# Patient Record
Sex: Male | Born: 1935
Health system: Southern US, Community
[De-identification: ages and names within clinical notes are randomized; demographics above are authoritative.]

## PROBLEM LIST (undated history)

## (undated) DIAGNOSIS — I10 Essential (primary) hypertension: Secondary | ICD-10-CM

## (undated) DIAGNOSIS — E785 Hyperlipidemia, unspecified: Secondary | ICD-10-CM

## (undated) DIAGNOSIS — F0390 Unspecified dementia without behavioral disturbance: Secondary | ICD-10-CM

## (undated) DIAGNOSIS — C61 Malignant neoplasm of prostate: Secondary | ICD-10-CM

## (undated) DIAGNOSIS — G250 Essential tremor: Secondary | ICD-10-CM

## (undated) DIAGNOSIS — I480 Paroxysmal atrial fibrillation: Secondary | ICD-10-CM

## (undated) DIAGNOSIS — D61818 Other pancytopenia: Secondary | ICD-10-CM

## (undated) DIAGNOSIS — J45909 Unspecified asthma, uncomplicated: Secondary | ICD-10-CM

## (undated) DIAGNOSIS — R569 Unspecified convulsions: Secondary | ICD-10-CM

## (undated) DIAGNOSIS — I2510 Atherosclerotic heart disease of native coronary artery without angina pectoris: Secondary | ICD-10-CM

## (undated) DIAGNOSIS — I509 Heart failure, unspecified: Secondary | ICD-10-CM

## (undated) HISTORY — PX: CATARACT EXTRACTION: SUR2

## (undated) HISTORY — DX: Unspecified convulsions: R56.9

## (undated) HISTORY — PX: HERNIA REPAIR: SHX51

## (undated) HISTORY — PX: CARDIOVERSION: SHX1299

## (undated) HISTORY — PX: AORTIC VALVE REPLACEMENT: SHX41

## (undated) HISTORY — DX: Other pancytopenia: D61.818

---

## 2012-12-13 DIAGNOSIS — K4090 Unilateral inguinal hernia, without obstruction or gangrene, not specified as recurrent: Secondary | ICD-10-CM | POA: Insufficient documentation

## 2013-02-14 DIAGNOSIS — I340 Nonrheumatic mitral (valve) insufficiency: Secondary | ICD-10-CM | POA: Insufficient documentation

## 2013-02-15 DIAGNOSIS — Z720 Tobacco use: Secondary | ICD-10-CM | POA: Insufficient documentation

## 2014-09-04 DIAGNOSIS — J920 Pleural plaque with presence of asbestos: Secondary | ICD-10-CM | POA: Insufficient documentation

## 2015-11-24 DIAGNOSIS — R001 Bradycardia, unspecified: Secondary | ICD-10-CM | POA: Insufficient documentation

## 2016-03-16 DIAGNOSIS — I340 Nonrheumatic mitral (valve) insufficiency: Secondary | ICD-10-CM | POA: Diagnosis not present

## 2016-03-16 DIAGNOSIS — D699 Hemorrhagic condition, unspecified: Secondary | ICD-10-CM | POA: Diagnosis not present

## 2016-03-16 DIAGNOSIS — I10 Essential (primary) hypertension: Secondary | ICD-10-CM | POA: Diagnosis not present

## 2016-03-16 DIAGNOSIS — G4733 Obstructive sleep apnea (adult) (pediatric): Secondary | ICD-10-CM | POA: Diagnosis not present

## 2016-03-16 DIAGNOSIS — I480 Paroxysmal atrial fibrillation: Secondary | ICD-10-CM | POA: Diagnosis not present

## 2016-04-21 DIAGNOSIS — D699 Hemorrhagic condition, unspecified: Secondary | ICD-10-CM | POA: Diagnosis not present

## 2016-04-27 DIAGNOSIS — D699 Hemorrhagic condition, unspecified: Secondary | ICD-10-CM | POA: Diagnosis not present

## 2016-05-03 DIAGNOSIS — D699 Hemorrhagic condition, unspecified: Secondary | ICD-10-CM | POA: Diagnosis not present

## 2016-05-10 DIAGNOSIS — C61 Malignant neoplasm of prostate: Secondary | ICD-10-CM | POA: Diagnosis not present

## 2016-05-14 DIAGNOSIS — C61 Malignant neoplasm of prostate: Secondary | ICD-10-CM | POA: Diagnosis not present

## 2016-05-14 DIAGNOSIS — N5201 Erectile dysfunction due to arterial insufficiency: Secondary | ICD-10-CM | POA: Diagnosis not present

## 2016-05-14 DIAGNOSIS — D699 Hemorrhagic condition, unspecified: Secondary | ICD-10-CM | POA: Diagnosis not present

## 2016-05-14 DIAGNOSIS — R3915 Urgency of urination: Secondary | ICD-10-CM | POA: Diagnosis not present

## 2016-05-14 DIAGNOSIS — R351 Nocturia: Secondary | ICD-10-CM | POA: Diagnosis not present

## 2016-05-14 DIAGNOSIS — R350 Frequency of micturition: Secondary | ICD-10-CM | POA: Diagnosis not present

## 2016-06-14 DIAGNOSIS — D699 Hemorrhagic condition, unspecified: Secondary | ICD-10-CM | POA: Diagnosis not present

## 2016-06-15 DIAGNOSIS — I480 Paroxysmal atrial fibrillation: Secondary | ICD-10-CM | POA: Diagnosis not present

## 2016-06-15 DIAGNOSIS — M479 Spondylosis, unspecified: Secondary | ICD-10-CM | POA: Diagnosis not present

## 2016-06-15 DIAGNOSIS — J189 Pneumonia, unspecified organism: Secondary | ICD-10-CM | POA: Diagnosis not present

## 2016-06-15 DIAGNOSIS — J920 Pleural plaque with presence of asbestos: Secondary | ICD-10-CM | POA: Diagnosis not present

## 2016-06-15 DIAGNOSIS — Z952 Presence of prosthetic heart valve: Secondary | ICD-10-CM | POA: Diagnosis not present

## 2016-07-13 DIAGNOSIS — I4891 Unspecified atrial fibrillation: Secondary | ICD-10-CM | POA: Diagnosis not present

## 2016-07-29 DIAGNOSIS — I480 Paroxysmal atrial fibrillation: Secondary | ICD-10-CM | POA: Diagnosis not present

## 2016-07-29 DIAGNOSIS — R079 Chest pain, unspecified: Secondary | ICD-10-CM | POA: Diagnosis not present

## 2016-07-29 DIAGNOSIS — J920 Pleural plaque with presence of asbestos: Secondary | ICD-10-CM | POA: Diagnosis not present

## 2016-07-29 DIAGNOSIS — R0609 Other forms of dyspnea: Secondary | ICD-10-CM | POA: Diagnosis not present

## 2016-07-29 DIAGNOSIS — I062 Rheumatic aortic stenosis with insufficiency: Secondary | ICD-10-CM | POA: Diagnosis not present

## 2016-08-11 DIAGNOSIS — I519 Heart disease, unspecified: Secondary | ICD-10-CM | POA: Diagnosis not present

## 2016-08-11 DIAGNOSIS — I361 Nonrheumatic tricuspid (valve) insufficiency: Secondary | ICD-10-CM | POA: Diagnosis not present

## 2016-08-11 DIAGNOSIS — I4891 Unspecified atrial fibrillation: Secondary | ICD-10-CM | POA: Diagnosis not present

## 2016-08-11 DIAGNOSIS — I062 Rheumatic aortic stenosis with insufficiency: Secondary | ICD-10-CM | POA: Diagnosis not present

## 2016-08-11 DIAGNOSIS — I340 Nonrheumatic mitral (valve) insufficiency: Secondary | ICD-10-CM | POA: Diagnosis not present

## 2016-08-11 DIAGNOSIS — Z954 Presence of other heart-valve replacement: Secondary | ICD-10-CM | POA: Diagnosis not present

## 2016-08-11 DIAGNOSIS — I517 Cardiomegaly: Secondary | ICD-10-CM | POA: Diagnosis not present

## 2016-08-19 DIAGNOSIS — D699 Hemorrhagic condition, unspecified: Secondary | ICD-10-CM | POA: Diagnosis not present

## 2016-08-26 DIAGNOSIS — D699 Hemorrhagic condition, unspecified: Secondary | ICD-10-CM | POA: Diagnosis not present

## 2016-08-30 DIAGNOSIS — I480 Paroxysmal atrial fibrillation: Secondary | ICD-10-CM | POA: Diagnosis not present

## 2016-09-15 DIAGNOSIS — Z7901 Long term (current) use of anticoagulants: Secondary | ICD-10-CM | POA: Diagnosis not present

## 2016-10-18 DIAGNOSIS — I480 Paroxysmal atrial fibrillation: Secondary | ICD-10-CM | POA: Diagnosis not present

## 2016-10-20 DIAGNOSIS — I480 Paroxysmal atrial fibrillation: Secondary | ICD-10-CM | POA: Diagnosis not present

## 2016-10-21 DIAGNOSIS — I480 Paroxysmal atrial fibrillation: Secondary | ICD-10-CM | POA: Diagnosis not present

## 2016-10-22 DIAGNOSIS — I480 Paroxysmal atrial fibrillation: Secondary | ICD-10-CM | POA: Diagnosis not present

## 2016-10-27 DIAGNOSIS — I062 Rheumatic aortic stenosis with insufficiency: Secondary | ICD-10-CM | POA: Diagnosis not present

## 2016-11-08 DIAGNOSIS — J3489 Other specified disorders of nose and nasal sinuses: Secondary | ICD-10-CM | POA: Diagnosis not present

## 2016-11-26 DIAGNOSIS — D699 Hemorrhagic condition, unspecified: Secondary | ICD-10-CM | POA: Diagnosis not present

## 2016-12-09 DIAGNOSIS — I480 Paroxysmal atrial fibrillation: Secondary | ICD-10-CM | POA: Diagnosis not present

## 2016-12-28 DIAGNOSIS — Z23 Encounter for immunization: Secondary | ICD-10-CM | POA: Diagnosis not present

## 2017-01-05 DIAGNOSIS — I480 Paroxysmal atrial fibrillation: Secondary | ICD-10-CM | POA: Diagnosis not present

## 2017-01-17 DIAGNOSIS — D699 Hemorrhagic condition, unspecified: Secondary | ICD-10-CM | POA: Diagnosis not present

## 2017-02-11 DIAGNOSIS — G250 Essential tremor: Secondary | ICD-10-CM | POA: Diagnosis not present

## 2017-02-11 DIAGNOSIS — C61 Malignant neoplasm of prostate: Secondary | ICD-10-CM | POA: Diagnosis not present

## 2017-02-11 DIAGNOSIS — F4322 Adjustment disorder with anxiety: Secondary | ICD-10-CM | POA: Diagnosis not present

## 2017-02-11 DIAGNOSIS — I480 Paroxysmal atrial fibrillation: Secondary | ICD-10-CM | POA: Diagnosis not present

## 2017-02-11 DIAGNOSIS — Z0001 Encounter for general adult medical examination with abnormal findings: Secondary | ICD-10-CM | POA: Diagnosis not present

## 2017-02-11 DIAGNOSIS — E782 Mixed hyperlipidemia: Secondary | ICD-10-CM | POA: Diagnosis not present

## 2017-02-11 DIAGNOSIS — S2231XA Fracture of one rib, right side, initial encounter for closed fracture: Secondary | ICD-10-CM | POA: Diagnosis not present

## 2017-02-11 DIAGNOSIS — Z1211 Encounter for screening for malignant neoplasm of colon: Secondary | ICD-10-CM | POA: Diagnosis not present

## 2017-02-11 DIAGNOSIS — I10 Essential (primary) hypertension: Secondary | ICD-10-CM | POA: Diagnosis not present

## 2017-02-11 DIAGNOSIS — J920 Pleural plaque with presence of asbestos: Secondary | ICD-10-CM | POA: Diagnosis not present

## 2017-02-15 DIAGNOSIS — D699 Hemorrhagic condition, unspecified: Secondary | ICD-10-CM | POA: Diagnosis not present

## 2017-02-17 DIAGNOSIS — D699 Hemorrhagic condition, unspecified: Secondary | ICD-10-CM | POA: Diagnosis not present

## 2017-02-24 DIAGNOSIS — I062 Rheumatic aortic stenosis with insufficiency: Secondary | ICD-10-CM | POA: Diagnosis not present

## 2017-03-08 DIAGNOSIS — I062 Rheumatic aortic stenosis with insufficiency: Secondary | ICD-10-CM | POA: Diagnosis not present

## 2017-04-11 DIAGNOSIS — D699 Hemorrhagic condition, unspecified: Secondary | ICD-10-CM | POA: Diagnosis not present

## 2017-04-20 DIAGNOSIS — I480 Paroxysmal atrial fibrillation: Secondary | ICD-10-CM | POA: Diagnosis not present

## 2017-05-17 DIAGNOSIS — C61 Malignant neoplasm of prostate: Secondary | ICD-10-CM | POA: Diagnosis not present

## 2017-05-17 DIAGNOSIS — D699 Hemorrhagic condition, unspecified: Secondary | ICD-10-CM | POA: Diagnosis not present

## 2017-05-18 DIAGNOSIS — J069 Acute upper respiratory infection, unspecified: Secondary | ICD-10-CM | POA: Diagnosis not present

## 2017-05-23 DIAGNOSIS — R350 Frequency of micturition: Secondary | ICD-10-CM | POA: Diagnosis not present

## 2017-05-23 DIAGNOSIS — R351 Nocturia: Secondary | ICD-10-CM | POA: Diagnosis not present

## 2017-05-23 DIAGNOSIS — C61 Malignant neoplasm of prostate: Secondary | ICD-10-CM | POA: Diagnosis not present

## 2017-05-23 DIAGNOSIS — R3915 Urgency of urination: Secondary | ICD-10-CM | POA: Diagnosis not present

## 2017-05-24 DIAGNOSIS — D699 Hemorrhagic condition, unspecified: Secondary | ICD-10-CM | POA: Diagnosis not present

## 2017-06-23 DIAGNOSIS — I480 Paroxysmal atrial fibrillation: Secondary | ICD-10-CM | POA: Diagnosis not present

## 2017-06-23 DIAGNOSIS — F17290 Nicotine dependence, other tobacco product, uncomplicated: Secondary | ICD-10-CM | POA: Diagnosis not present

## 2017-06-23 DIAGNOSIS — I10 Essential (primary) hypertension: Secondary | ICD-10-CM | POA: Diagnosis not present

## 2017-06-23 DIAGNOSIS — E782 Mixed hyperlipidemia: Secondary | ICD-10-CM | POA: Diagnosis not present

## 2017-06-27 DIAGNOSIS — D699 Hemorrhagic condition, unspecified: Secondary | ICD-10-CM | POA: Diagnosis not present

## 2017-06-29 DIAGNOSIS — D699 Hemorrhagic condition, unspecified: Secondary | ICD-10-CM | POA: Diagnosis not present

## 2017-07-19 DIAGNOSIS — D699 Hemorrhagic condition, unspecified: Secondary | ICD-10-CM | POA: Diagnosis not present

## 2017-08-02 DIAGNOSIS — D699 Hemorrhagic condition, unspecified: Secondary | ICD-10-CM | POA: Diagnosis not present

## 2017-08-17 DIAGNOSIS — I10 Essential (primary) hypertension: Secondary | ICD-10-CM | POA: Diagnosis not present

## 2017-08-17 DIAGNOSIS — J209 Acute bronchitis, unspecified: Secondary | ICD-10-CM | POA: Diagnosis not present

## 2017-08-17 DIAGNOSIS — I480 Paroxysmal atrial fibrillation: Secondary | ICD-10-CM | POA: Diagnosis not present

## 2017-08-17 DIAGNOSIS — E782 Mixed hyperlipidemia: Secondary | ICD-10-CM | POA: Diagnosis not present

## 2017-08-17 DIAGNOSIS — R413 Other amnesia: Secondary | ICD-10-CM | POA: Diagnosis not present

## 2017-08-22 DIAGNOSIS — D699 Hemorrhagic condition, unspecified: Secondary | ICD-10-CM | POA: Diagnosis not present

## 2017-09-27 DIAGNOSIS — D699 Hemorrhagic condition, unspecified: Secondary | ICD-10-CM | POA: Diagnosis not present

## 2017-10-03 DIAGNOSIS — J40 Bronchitis, not specified as acute or chronic: Secondary | ICD-10-CM | POA: Diagnosis not present

## 2017-10-03 DIAGNOSIS — R05 Cough: Secondary | ICD-10-CM | POA: Diagnosis not present

## 2017-10-10 DIAGNOSIS — D699 Hemorrhagic condition, unspecified: Secondary | ICD-10-CM | POA: Diagnosis not present

## 2017-10-19 DIAGNOSIS — D699 Hemorrhagic condition, unspecified: Secondary | ICD-10-CM | POA: Diagnosis not present

## 2017-11-03 DIAGNOSIS — I214 Non-ST elevation (NSTEMI) myocardial infarction: Secondary | ICD-10-CM | POA: Insufficient documentation

## 2017-11-03 DIAGNOSIS — I340 Nonrheumatic mitral (valve) insufficiency: Secondary | ICD-10-CM | POA: Diagnosis not present

## 2017-11-03 DIAGNOSIS — Z8546 Personal history of malignant neoplasm of prostate: Secondary | ICD-10-CM | POA: Diagnosis not present

## 2017-11-03 DIAGNOSIS — F17220 Nicotine dependence, chewing tobacco, uncomplicated: Secondary | ICD-10-CM | POA: Diagnosis present

## 2017-11-03 DIAGNOSIS — I480 Paroxysmal atrial fibrillation: Secondary | ICD-10-CM | POA: Diagnosis present

## 2017-11-03 DIAGNOSIS — I272 Pulmonary hypertension, unspecified: Secondary | ICD-10-CM | POA: Diagnosis present

## 2017-11-03 DIAGNOSIS — I429 Cardiomyopathy, unspecified: Secondary | ICD-10-CM | POA: Diagnosis not present

## 2017-11-03 DIAGNOSIS — Z8249 Family history of ischemic heart disease and other diseases of the circulatory system: Secondary | ICD-10-CM | POA: Diagnosis not present

## 2017-11-03 DIAGNOSIS — I517 Cardiomegaly: Secondary | ICD-10-CM | POA: Diagnosis not present

## 2017-11-03 DIAGNOSIS — G4733 Obstructive sleep apnea (adult) (pediatric): Secondary | ICD-10-CM | POA: Diagnosis present

## 2017-11-03 DIAGNOSIS — Z7901 Long term (current) use of anticoagulants: Secondary | ICD-10-CM | POA: Diagnosis not present

## 2017-11-03 DIAGNOSIS — Z923 Personal history of irradiation: Secondary | ICD-10-CM | POA: Diagnosis not present

## 2017-11-03 DIAGNOSIS — R509 Fever, unspecified: Secondary | ICD-10-CM | POA: Diagnosis not present

## 2017-11-03 DIAGNOSIS — Z952 Presence of prosthetic heart valve: Secondary | ICD-10-CM | POA: Diagnosis not present

## 2017-11-03 DIAGNOSIS — I10 Essential (primary) hypertension: Secondary | ICD-10-CM | POA: Diagnosis present

## 2017-11-03 DIAGNOSIS — R0789 Other chest pain: Secondary | ICD-10-CM | POA: Diagnosis not present

## 2017-11-03 DIAGNOSIS — Z79899 Other long term (current) drug therapy: Secondary | ICD-10-CM | POA: Diagnosis not present

## 2017-11-03 DIAGNOSIS — R05 Cough: Secondary | ICD-10-CM | POA: Diagnosis present

## 2017-11-03 DIAGNOSIS — R0602 Shortness of breath: Secondary | ICD-10-CM | POA: Diagnosis not present

## 2017-11-03 DIAGNOSIS — R072 Precordial pain: Secondary | ICD-10-CM | POA: Diagnosis not present

## 2017-11-03 DIAGNOSIS — Z886 Allergy status to analgesic agent status: Secondary | ICD-10-CM | POA: Diagnosis not present

## 2017-11-03 DIAGNOSIS — I350 Nonrheumatic aortic (valve) stenosis: Secondary | ICD-10-CM | POA: Diagnosis not present

## 2017-11-03 DIAGNOSIS — R079 Chest pain, unspecified: Secondary | ICD-10-CM | POA: Diagnosis not present

## 2017-11-03 DIAGNOSIS — I361 Nonrheumatic tricuspid (valve) insufficiency: Secondary | ICD-10-CM | POA: Diagnosis not present

## 2017-11-03 DIAGNOSIS — E785 Hyperlipidemia, unspecified: Secondary | ICD-10-CM | POA: Diagnosis present

## 2017-11-11 DIAGNOSIS — Z952 Presence of prosthetic heart valve: Secondary | ICD-10-CM | POA: Diagnosis not present

## 2017-11-14 DIAGNOSIS — Z952 Presence of prosthetic heart valve: Secondary | ICD-10-CM | POA: Diagnosis not present

## 2017-11-25 DIAGNOSIS — I252 Old myocardial infarction: Secondary | ICD-10-CM | POA: Diagnosis not present

## 2017-11-25 DIAGNOSIS — I480 Paroxysmal atrial fibrillation: Secondary | ICD-10-CM | POA: Diagnosis not present

## 2017-11-25 DIAGNOSIS — Z23 Encounter for immunization: Secondary | ICD-10-CM | POA: Diagnosis not present

## 2017-11-25 DIAGNOSIS — Z952 Presence of prosthetic heart valve: Secondary | ICD-10-CM | POA: Diagnosis not present

## 2017-11-25 DIAGNOSIS — J920 Pleural plaque with presence of asbestos: Secondary | ICD-10-CM | POA: Diagnosis not present

## 2017-11-25 DIAGNOSIS — R251 Tremor, unspecified: Secondary | ICD-10-CM | POA: Diagnosis not present

## 2017-11-25 DIAGNOSIS — R419 Unspecified symptoms and signs involving cognitive functions and awareness: Secondary | ICD-10-CM | POA: Diagnosis not present

## 2017-11-25 DIAGNOSIS — I10 Essential (primary) hypertension: Secondary | ICD-10-CM | POA: Diagnosis not present

## 2017-11-28 DIAGNOSIS — D699 Hemorrhagic condition, unspecified: Secondary | ICD-10-CM | POA: Diagnosis not present

## 2017-11-29 DIAGNOSIS — I255 Ischemic cardiomyopathy: Secondary | ICD-10-CM | POA: Diagnosis not present

## 2017-11-29 DIAGNOSIS — I340 Nonrheumatic mitral (valve) insufficiency: Secondary | ICD-10-CM | POA: Diagnosis not present

## 2017-11-29 DIAGNOSIS — G4733 Obstructive sleep apnea (adult) (pediatric): Secondary | ICD-10-CM | POA: Diagnosis not present

## 2017-11-29 DIAGNOSIS — E663 Overweight: Secondary | ICD-10-CM | POA: Diagnosis not present

## 2017-11-29 DIAGNOSIS — Z952 Presence of prosthetic heart valve: Secondary | ICD-10-CM | POA: Diagnosis not present

## 2017-11-29 DIAGNOSIS — I214 Non-ST elevation (NSTEMI) myocardial infarction: Secondary | ICD-10-CM | POA: Diagnosis not present

## 2017-11-29 DIAGNOSIS — I480 Paroxysmal atrial fibrillation: Secondary | ICD-10-CM | POA: Diagnosis not present

## 2017-12-01 DIAGNOSIS — I480 Paroxysmal atrial fibrillation: Secondary | ICD-10-CM | POA: Diagnosis not present

## 2017-12-14 DIAGNOSIS — Z952 Presence of prosthetic heart valve: Secondary | ICD-10-CM | POA: Diagnosis not present

## 2017-12-16 DIAGNOSIS — I110 Hypertensive heart disease with heart failure: Secondary | ICD-10-CM | POA: Diagnosis not present

## 2017-12-16 DIAGNOSIS — Z7901 Long term (current) use of anticoagulants: Secondary | ICD-10-CM | POA: Diagnosis not present

## 2017-12-16 DIAGNOSIS — R9439 Abnormal result of other cardiovascular function study: Secondary | ICD-10-CM | POA: Diagnosis not present

## 2017-12-16 DIAGNOSIS — E785 Hyperlipidemia, unspecified: Secondary | ICD-10-CM | POA: Diagnosis not present

## 2017-12-16 DIAGNOSIS — I340 Nonrheumatic mitral (valve) insufficiency: Secondary | ICD-10-CM | POA: Diagnosis not present

## 2017-12-16 DIAGNOSIS — I2582 Chronic total occlusion of coronary artery: Secondary | ICD-10-CM | POA: Diagnosis not present

## 2017-12-16 DIAGNOSIS — I5189 Other ill-defined heart diseases: Secondary | ICD-10-CM | POA: Diagnosis not present

## 2017-12-16 DIAGNOSIS — I255 Ischemic cardiomyopathy: Secondary | ICD-10-CM | POA: Diagnosis not present

## 2017-12-16 DIAGNOSIS — R001 Bradycardia, unspecified: Secondary | ICD-10-CM | POA: Diagnosis not present

## 2017-12-16 DIAGNOSIS — I2510 Atherosclerotic heart disease of native coronary artery without angina pectoris: Secondary | ICD-10-CM | POA: Diagnosis not present

## 2017-12-16 DIAGNOSIS — F17290 Nicotine dependence, other tobacco product, uncomplicated: Secondary | ICD-10-CM | POA: Diagnosis not present

## 2017-12-16 DIAGNOSIS — Z952 Presence of prosthetic heart valve: Secondary | ICD-10-CM | POA: Diagnosis not present

## 2017-12-16 DIAGNOSIS — R0602 Shortness of breath: Secondary | ICD-10-CM | POA: Diagnosis not present

## 2017-12-16 DIAGNOSIS — I480 Paroxysmal atrial fibrillation: Secondary | ICD-10-CM | POA: Diagnosis not present

## 2017-12-16 DIAGNOSIS — I2584 Coronary atherosclerosis due to calcified coronary lesion: Secondary | ICD-10-CM | POA: Diagnosis not present

## 2017-12-16 DIAGNOSIS — I10 Essential (primary) hypertension: Secondary | ICD-10-CM | POA: Diagnosis not present

## 2017-12-16 DIAGNOSIS — I4891 Unspecified atrial fibrillation: Secondary | ICD-10-CM | POA: Diagnosis not present

## 2017-12-16 DIAGNOSIS — G4733 Obstructive sleep apnea (adult) (pediatric): Secondary | ICD-10-CM | POA: Diagnosis not present

## 2017-12-17 DIAGNOSIS — I5189 Other ill-defined heart diseases: Secondary | ICD-10-CM | POA: Diagnosis not present

## 2017-12-17 DIAGNOSIS — Z7901 Long term (current) use of anticoagulants: Secondary | ICD-10-CM | POA: Diagnosis not present

## 2017-12-17 DIAGNOSIS — R001 Bradycardia, unspecified: Secondary | ICD-10-CM | POA: Diagnosis not present

## 2017-12-17 DIAGNOSIS — I2510 Atherosclerotic heart disease of native coronary artery without angina pectoris: Secondary | ICD-10-CM | POA: Diagnosis not present

## 2017-12-17 DIAGNOSIS — Z9889 Other specified postprocedural states: Secondary | ICD-10-CM | POA: Diagnosis not present

## 2017-12-17 DIAGNOSIS — I10 Essential (primary) hypertension: Secondary | ICD-10-CM | POA: Diagnosis not present

## 2017-12-17 DIAGNOSIS — I2584 Coronary atherosclerosis due to calcified coronary lesion: Secondary | ICD-10-CM | POA: Diagnosis not present

## 2017-12-17 DIAGNOSIS — I480 Paroxysmal atrial fibrillation: Secondary | ICD-10-CM | POA: Diagnosis not present

## 2017-12-17 DIAGNOSIS — R0602 Shortness of breath: Secondary | ICD-10-CM | POA: Diagnosis not present

## 2017-12-17 DIAGNOSIS — I255 Ischemic cardiomyopathy: Secondary | ICD-10-CM | POA: Diagnosis not present

## 2017-12-17 DIAGNOSIS — F17290 Nicotine dependence, other tobacco product, uncomplicated: Secondary | ICD-10-CM | POA: Diagnosis not present

## 2017-12-17 DIAGNOSIS — I2582 Chronic total occlusion of coronary artery: Secondary | ICD-10-CM | POA: Diagnosis not present

## 2017-12-19 DIAGNOSIS — Z952 Presence of prosthetic heart valve: Secondary | ICD-10-CM | POA: Diagnosis not present

## 2017-12-22 DIAGNOSIS — Z952 Presence of prosthetic heart valve: Secondary | ICD-10-CM | POA: Diagnosis not present

## 2017-12-29 DIAGNOSIS — Z952 Presence of prosthetic heart valve: Secondary | ICD-10-CM | POA: Diagnosis not present

## 2018-01-03 DIAGNOSIS — Z952 Presence of prosthetic heart valve: Secondary | ICD-10-CM | POA: Diagnosis not present

## 2018-01-04 DIAGNOSIS — Z952 Presence of prosthetic heart valve: Secondary | ICD-10-CM | POA: Diagnosis not present

## 2018-01-11 DIAGNOSIS — Z952 Presence of prosthetic heart valve: Secondary | ICD-10-CM | POA: Diagnosis not present

## 2018-01-14 ENCOUNTER — Other Ambulatory Visit: Payer: Self-pay

## 2018-01-14 ENCOUNTER — Emergency Department (HOSPITAL_BASED_OUTPATIENT_CLINIC_OR_DEPARTMENT_OTHER)
Admission: EM | Admit: 2018-01-14 | Discharge: 2018-01-14 | Disposition: A | Discharge status: Home / Self Care | Payer: Medicare Other | Attending: Emergency Medicine | Admitting: Emergency Medicine

## 2018-01-14 ENCOUNTER — Encounter (HOSPITAL_BASED_OUTPATIENT_CLINIC_OR_DEPARTMENT_OTHER): Payer: Self-pay | Admitting: Emergency Medicine

## 2018-01-14 DIAGNOSIS — I4891 Unspecified atrial fibrillation: Secondary | ICD-10-CM | POA: Insufficient documentation

## 2018-01-14 DIAGNOSIS — I440 Atrioventricular block, first degree: Secondary | ICD-10-CM | POA: Diagnosis not present

## 2018-01-14 DIAGNOSIS — R791 Abnormal coagulation profile: Secondary | ICD-10-CM | POA: Diagnosis not present

## 2018-01-14 LAB — PROTIME-INR
INR: 3.79
PROTHROMBIN TIME: 36.8 s — AB (ref 11.4–15.2)

## 2018-01-14 NOTE — ED Triage Notes (Signed)
Reports to ER for INR draw due to previously high levels this week.

## 2018-01-14 NOTE — Discharge Instructions (Addendum)
INR was 3.79. EKG shows patient is not in afib.

## 2018-01-14 NOTE — ED Provider Notes (Signed)
[facility] EMERGENCY DEPARTMENT Provider Note   CSN: [id number] Arrival date & time: 01/14/18  1415     History   Chief Complaint Chief Complaint  Patient presents with  . Labs Only    HPI Tanner Ferguson is a 84 y.o. male.  Patient here for INR check.  Has been elevated, last INR was greater than 7.  Patient has had no bleeding.  History of atrial fibrillation.  The history is provided by the patient.  Illness  This is a chronic problem. The current episode started yesterday. The problem occurs constantly. The problem has not changed since onset.Pertinent negatives include no chest pain, no abdominal pain, no headaches and no shortness of breath. Nothing aggravates the symptoms. Nothing relieves the symptoms. He has tried nothing for the symptoms. The treatment provided no relief.    History reviewed. No pertinent past medical history.  There are no active problems to display for this patient.      Home Medications    Prior to Admission medications   Medication Sig Start Date End Date Taking? Authorizing Provider  AMIODARONE HCL PO Take by mouth.   Yes [provider]  LISINOPRIL PO Take by mouth.   Yes [provider]  Warfarin Sodium (COUMADIN PO) Take by mouth.   Yes [provider]    Family History History reviewed. No pertinent family history.  Social History Social History   Tobacco Use  . Smoking status: Not on file  Substance Use Topics  . Alcohol use: Not on file  . Drug use: Not on file     Allergies   Penicillins   Review of Systems Review of Systems  Respiratory: Negative for shortness of breath.   Cardiovascular: Negative for chest pain.  Gastrointestinal: Negative for abdominal pain.  Skin: Negative for color change, pallor, rash and wound.  Neurological: Negative for headaches.     Physical Exam Updated Vital Signs    Physical Exam  Constitutional: He appears well-developed and  well-nourished.  HENT:  Head: Normocephalic and atraumatic.  Eyes: Conjunctivae are normal.  Neck: Neck supple.  Cardiovascular: Normal rate and regular rhythm.  No murmur heard. Pulmonary/Chest: Effort normal and breath sounds normal. No respiratory distress.  Abdominal: Soft. There is no tenderness.  Musculoskeletal: He exhibits no edema.  Neurological: He is alert.  Skin: Skin is warm and dry.  Psychiatric: He has a normal mood and affect.  Nursing note and vitals reviewed.    ED Treatments / Results  Labs (all labs ordered are listed, but only abnormal results are displayed) Labs Reviewed  PROTIME-INR - Abnormal; Notable for the following components:      Result Value   Prothrombin Time 36.8 (*)    All other components within normal limits    EKG EKG Interpretation  Date/Time:  Saturday January 14 2018 15:51:01 EST Ventricular Rate:  60 PR Interval:  224 QRS Duration: 110 QT Interval:  398 QTC Calculation: 398 R Axis:   14 Text Interpretation:  Sinus rhythm with 1st degree A-V block T wave abnormality, consider inferolateral ischemia Abnormal ECG Confirmed by Lennice Sites [phone]) on 01/14/2018 4:02:57 PM   Radiology No results found.  Procedures Procedures (including critical care time)  Medications Ordered in ED Medications - No data to display   Initial Impression / Assessment and Plan / ED Course  I have reviewed the triage vital signs and the nursing notes.  Pertinent labs & imaging results that were available during my care of  the patient were reviewed by me and considered in my medical decision making (see chart for details).     Tanner Ferguson is an 84 year old male with history of atrial fibrillation on Coumadin here for INR check.  Patient with no symptoms.  No signs of bleeding.  No chest pain, shortness of breath.  Overall is well-appearing.  Last Coumadin INR level was greater than 7 and has been holding his medication.  He is to follow-up  with his internal medicine doctor next week.  INR today is 3.79.  EKG shows no signs of atrial fibrillation.  No signs of ischemia.  Recommend that patient hold dose of Coumadin and follow-up with internal medicine doctor via phone call for further directions.  Discharged in good condition.  Final Clinical Impressions(s) / ED Diagnoses   Final diagnoses:  Elevated INR    ED Discharge Orders    None       Lennice Sites, DO 01/14/18 1603

## 2018-01-16 DIAGNOSIS — Z952 Presence of prosthetic heart valve: Secondary | ICD-10-CM | POA: Diagnosis not present

## 2018-01-20 DIAGNOSIS — Z7901 Long term (current) use of anticoagulants: Secondary | ICD-10-CM | POA: Diagnosis not present

## 2018-01-20 DIAGNOSIS — Z952 Presence of prosthetic heart valve: Secondary | ICD-10-CM | POA: Diagnosis not present

## 2018-01-25 ENCOUNTER — Encounter (HOSPITAL_COMMUNITY): Payer: Self-pay | Admitting: Internal Medicine

## 2018-01-25 ENCOUNTER — Ambulatory Visit (HOSPITAL_COMMUNITY)
Admission: RE | Admit: 2018-01-25 | Discharge: 2018-01-25 | Disposition: A | Discharge status: Home / Self Care | Payer: Medicare Other | Source: Ambulatory Visit | Attending: Internal Medicine | Admitting: Internal Medicine

## 2018-01-25 VITALS — BP 146/72 | HR 50 | Ht 62.0 in | Wt 154.8 lb

## 2018-01-25 DIAGNOSIS — C61 Malignant neoplasm of prostate: Secondary | ICD-10-CM | POA: Insufficient documentation

## 2018-01-25 DIAGNOSIS — I480 Paroxysmal atrial fibrillation: Secondary | ICD-10-CM | POA: Diagnosis not present

## 2018-01-25 DIAGNOSIS — I2510 Atherosclerotic heart disease of native coronary artery without angina pectoris: Secondary | ICD-10-CM

## 2018-01-25 DIAGNOSIS — I110 Hypertensive heart disease with heart failure: Secondary | ICD-10-CM | POA: Diagnosis not present

## 2018-01-25 DIAGNOSIS — Z952 Presence of prosthetic heart valve: Secondary | ICD-10-CM

## 2018-01-25 DIAGNOSIS — I255 Ischemic cardiomyopathy: Secondary | ICD-10-CM

## 2018-01-25 DIAGNOSIS — Z880 Allergy status to penicillin: Secondary | ICD-10-CM | POA: Insufficient documentation

## 2018-01-25 DIAGNOSIS — Z79899 Other long term (current) drug therapy: Secondary | ICD-10-CM | POA: Insufficient documentation

## 2018-01-25 DIAGNOSIS — Z7982 Long term (current) use of aspirin: Secondary | ICD-10-CM | POA: Diagnosis not present

## 2018-01-25 DIAGNOSIS — Z7901 Long term (current) use of anticoagulants: Secondary | ICD-10-CM | POA: Diagnosis not present

## 2018-01-25 DIAGNOSIS — G4733 Obstructive sleep apnea (adult) (pediatric): Secondary | ICD-10-CM | POA: Insufficient documentation

## 2018-01-25 DIAGNOSIS — I509 Heart failure, unspecified: Secondary | ICD-10-CM | POA: Insufficient documentation

## 2018-01-25 DIAGNOSIS — I4891 Unspecified atrial fibrillation: Secondary | ICD-10-CM | POA: Insufficient documentation

## 2018-01-25 HISTORY — DX: Essential (primary) hypertension: I10

## 2018-01-25 HISTORY — DX: Essential tremor: G25.0

## 2018-01-25 HISTORY — DX: Hyperlipidemia, unspecified: E78.5

## 2018-01-25 HISTORY — DX: Paroxysmal atrial fibrillation: I48.0

## 2018-01-25 HISTORY — DX: Heart failure, unspecified: I50.9

## 2018-01-25 HISTORY — DX: Malignant neoplasm of prostate: C61

## 2018-01-25 HISTORY — DX: Atherosclerotic heart disease of native coronary artery without angina pectoris: I25.10

## 2018-01-25 LAB — BRAIN NATRIURETIC PEPTIDE: B Natriuretic Peptide: 328.3 pg/mL — ABNORMAL HIGH (ref 0.0–100.0)

## 2018-01-25 LAB — COMPREHENSIVE METABOLIC PANEL
ALBUMIN: 3.9 g/dL (ref 3.5–5.0)
ALT: 12 U/L (ref 0–44)
AST: 19 U/L (ref 15–41)
Alkaline Phosphatase: 81 U/L (ref 38–126)
Anion gap: 5 (ref 5–15)
BILIRUBIN TOTAL: 0.6 mg/dL (ref 0.3–1.2)
BUN: 16 mg/dL (ref 8–23)
CO2: 24 mmol/L (ref 22–32)
Calcium: 9.2 mg/dL (ref 8.9–10.3)
Chloride: 102 mmol/L (ref 98–111)
Creatinine, Ser: 1.47 mg/dL — ABNORMAL HIGH (ref 0.61–1.24)
GFR calc Af Amer: 49 mL/min — ABNORMAL LOW (ref >60)
GFR calc non Af Amer: 42 mL/min — ABNORMAL LOW (ref >60)
GLUCOSE: 94 mg/dL (ref 70–99)
POTASSIUM: 5.3 mmol/L — AB (ref 3.5–5.1)
Sodium: 131 mmol/L — ABNORMAL LOW (ref 135–145)
Total Protein: 8.7 g/dL — ABNORMAL HIGH (ref 6.5–8.1)

## 2018-01-25 LAB — CBC
HEMATOCRIT: 40.1 % (ref 39.0–52.0)
HEMOGLOBIN: 12.7 g/dL — AB (ref 13.0–17.0)
MCH: 29.5 pg (ref 26.0–34.0)
MCHC: 31.7 g/dL (ref 30.0–36.0)
MCV: 93 fL (ref 80.0–100.0)
NRBC: 0 % (ref 0.0–0.2)
Platelets: 128 10*3/uL — ABNORMAL LOW (ref 150–400)
RBC: 4.31 MIL/uL (ref 4.22–5.81)
RDW: 13.8 % (ref 11.5–15.5)
WBC: 2.9 10*3/uL — ABNORMAL LOW (ref 4.0–10.5)

## 2018-01-25 MED ORDER — AMIODARONE HCL 200 MG PO TABS: 100.0000 mg | ORAL_TABLET | Freq: Every day | ORAL | 3 refills | Status: DC

## 2018-01-25 NOTE — Progress Notes (Signed)
-  ADVANCED HF CLINIC CONSULT NOTE  Referring Physician: Former Film/video editor in [place]  HPI: Mr Denham is an 84 year old with history of aortic stenosis s/p mechanical AVR 1995, recently diagnosed CAD, PAF with ICM, OSA, HTN, HL  and prostate CA. He was referred to the HF clinic by his cardiologist in [place], [place] to establish care for his PAF and CAD. Marland Kitchen He recently moved here  He has moved to [place] to be with family. Here is here with his son and daughter.  Has h/o mechanical AVR in 1995. No known CAD  Apparently has h/o PAF and had 2 previous DC-CV as he was intolerant of AF.   Had been quite independent up until September 2019. Used to work as a Development worker, community. Retired 5 years ago at age 5. 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 Now sedentary. Never smoked in past but chewed tobacco.   Admitted 9/19 with URI with cough and yellow sputum. Developed  left-sided CP radiating to jaw. Went to ER. Admitted to ICU trop peaked at 10.26. Echo EF 40-45%. Underwent stress test with EF 36%.. Family initially chose medical therapy and not cath. Was discharged. Had ongoing SOB so underwent cath 12/16/17. Found to have severe CAD per family -> not amenable to PCI and not CABG candidate. Also developed PAF. Had DC-CV on 12/01/17. Was quite symptomatic so had DC-CV. AF recurred. Started on amio and had repeat DC-CV on 12/16/17.  Denies CP or SOB but says he isn't do anything. Family says he does get SOB and fatigued with ADLs. Main complaint is severe worsening of his resting tremor with amio to the point where he can't even hold a cup. No edema, orthopnea or PND.    Cardiac Studies: -Lexiscan 10/2017 - stress 36% . Large fixed apical defect, mild to moderate reversibility mid anterior and mid inferior walls - ECHO 10/2017 EF 40-45% Mild MR   Review of Systems: [y] = yes, [ ]  = no   General: Weight gain [ ] ; Weight loss [ ] ; Anorexia [ ] ; Fatigue Blue.Reese ]; Fever [ ] ; Chills [ ] ; Weakness [ ]   Cardiac: Chest  pain/pressure [ ] ; Resting SOB [ ] ; Exertional SOB Blue.Reese ]; Orthopnea [ ] ; Pedal Edema [ ] ; Palpitations [ ] ; Syncope [ ] ; Presyncope [ ] ; Paroxysmal nocturnal dyspnea[ ]   Pulmonary: Cough [ ] ; Wheezing[ ] ; Hemoptysis[ ] ; Sputum [ ] ; Snoring [ ]   GI: Vomiting[ ] ; Dysphagia[ ] ; Melena[ ] ; Hematochezia [ ] ; Heartburn[ ] ; Abdominal pain [ ] ; Constipation [ ] ; Diarrhea [ ] ; BRBPR [ ]   GU: Hematuria[ ] ; Dysuria [ ] ; Nocturia[ ]   Vascular: Pain in legs with walking [ ] ; Pain in feet with lying flat [ ] ; Non-healing sores [ ] ; Stroke [ ] ; TIA [ ] ; Slurred speech [ ] ;  Neuro: Headaches[ ] ; Vertigo[ ] ; Seizures[ ] ; Paresthesias[ ] ;Blurred vision [ ] ; Diplopia [ ] ; Vision changes [ ]   Ortho/Skin: Arthritis Blue.Reese ]; Joint pain Blue.Reese ]; Muscle pain [ ] ; Joint swelling [ ] ; Back Pain [ ] ; Rash [ ]   Psych: Depression[ ] ; Anxiety[ ]   Heme: Bleeding problems [ ] ; Clotting disorders [ ] ; Anemia [ ]   Endocrine: Diabetes [ ] ; Thyroid dysfunction[ ]    Past Medical History:  Diagnosis Date  . CAD (coronary artery disease)    s/p NSTEMI 9/19. Cath in [place], [place] 10/19. Severe CAD not amenable to PCI. Not CABG candidate  . CHF (congestive heart failure) (HCC)    EF 40-45% by  echo due to iCM. EF 36% by Myoview 9/19  . Essential tremor   . Hyperlipidemia    Intolerant of statins  . Hypertension   . PAF (paroxysmal atrial fibrillation) ([place])   . Prostate cancer [facility])      Current Outpatient Medications  Medication Sig Dispense Refill  . amiodarone (PACERONE) 200 MG tablet Take 200 mg by mouth daily.    Marland Kitchen aspirin EC 81 MG tablet Take 81 mg by mouth daily.    Marland Kitchen lisinopril (PRINIVIL,ZESTRIL) 5 MG tablet Take 10 mg by mouth daily.    . phenytoin (DILANTIN) 100 MG ER capsule Take 100 mg by mouth 2 (two) times daily.    . tamsulosin (FLOMAX) 0.4 MG CAPS capsule Take 0.4 mg by mouth daily.    Marland Kitchen warfarin (COUMADIN) 4 MG tablet Take 4 mg by mouth daily.     No current facility-administered medications for this encounter.      Allergies  Allergen Reactions  . Penicillins Rash      Social History   Socioeconomic History  . Marital status: Unknown    Spouse name: Not on file  . Number of children: Not on file  . Years of education: Not on file  . Highest education level: Not on file  Occupational History  . Not on file  Social Needs  . Financial resource strain: Not on file  . Food insecurity:    Worry: Not on file    Inability: Not on file  . Transportation needs:    Medical: Not on file    Non-medical: Not on file  Tobacco Use  . Smoking status: Never Smoker  . Smokeless tobacco: Former Systems developer    Types: Chew  Substance and Sexual Activity  . Alcohol use: Not on file  . Drug use: Not on file  . Sexual activity: Not on file  Lifestyle  . Physical activity:    Days per week: Not on file    Minutes per session: Not on file  . Stress: Not on file  Relationships  . Social connections:    Talks on phone: Not on file    Gets together: Not on file    Attends religious service: Not on file    Active member of club or organization: Not on file    Attends meetings of clubs or organizations: Not on file    Relationship status: Not on file  . Intimate partner violence:    Fear of current or ex partner: Not on file    Emotionally abused: Not on file    Physically abused: Not on file    Forced sexual activity: Not on file  Other Topics Concern  . Not on file  Social History Narrative  . Not on file    FHx: No FHx of premature CAD or SCD  Vitals:   01/25/18 1131  BP: (!) 146/72  Pulse: (!) 50  SpO2: 94%  Weight: 70.2 kg (154 lb 12.8 oz)  Height: 5\' 2"  (1.575 m)    PHYSICAL EXAM: General:  Elderly male. Diffuse tremor HEENT: normal Neck: supple. no JVD. Carotids 2+ bilat; no bruits. No lymphadenopathy or thryomegaly appreciated. Cor: PMI nondisplaced. Loletha Grayer regular .Mechanical s2. Crisp  No rubs, gallops or murmurs. Lungs: clear with decreased BS throguhout Abdomen: soft,  nontender, nondistended. No hepatosplenomegaly. No bruits or masses. Good bowel sounds. Extremities: no cyanosis, clubbing, rash, edema Neuro: alert & oriented x 3, cranial nerves grossly intact. moves all 4 extremities w/o difficulty. Affect pleasant.  Diffuse severe tremor  ECG: Sinus brady 53, 1AVb 265ms inferolateral TWI concerning for ischemia Personally reviewed    ASSESSMENT & PLAN:  1. CAD  - s/p recent NSTEMI 9/19 - cath in [place], [place] in 10/19 with severe CAD not amenable to PCI by family report. Not CABG candidate - ECG concerning but no angina currently - Continue medical therapy - On ASA 81. Will continue ASA even with warfarin given recent MI - HR too low for b-blocker. Previously intolerant of all statins due to myalgias. Need to consider PCSK-9 - Refer to CR - Will need to get cath films to review with our interventional team  2. Ischemic CM - EF 36% by Myoview - EF 40-45% by outside echo  - No overt HF currently. Volume status ok.  - Continue lisinopril for now. Check labs. May be able to switch to [facility] - Not on lasix currently  - no b-blocker with bradycardia - no spiro with h/o hyperkalemia - repeat echo  3. PAF - this is most difficult issue currently - it appears that he was clearly symptomatic with PAF and thus needs rhythm control strategy - maintaining NSR on amio 200 daily but has worsened his resting tremor to an intolerable point - given ischemic CM. Only real AA options are amio and tikosyn - will reduce amio to 100 daily and see if it helps - refer to AF Clinic - continue warfarin with AF and mechanical AVR  4. Mechanical AVR - functioning well on exam - repeat echo - continue warfarin. Will refer Coumadin Clinic to establish care - SBE prophylaxis  5. HTN - BP slightly elevated - may benefit from switching lisinopril to [facility].  - will get labs and echo to guide further decision making   We will get ball rolling here with his car  but as HF not major issue will have him establish with [facility] for ongoing care.   Glori Bickers, MD  2:20 PM

## 2018-01-25 NOTE — Patient Instructions (Signed)
Labs done today.  You have been referred to Coumadin Clinic. They will contact you with your appointment.  You have been referred to A-Fib Clinic. They will contact you with your appointment.  You have been referred to [facility]: Dr. Buford Dresser. That office will contact you for your appointment.  You have been referred to Cardiac Rehab. They will contact you for your appointment.  DECREASE Amiodarone 100mg  (0.5 tab) daily.

## 2018-01-26 ENCOUNTER — Encounter (HOSPITAL_COMMUNITY): Payer: Self-pay | Admitting: Internal Medicine

## 2018-01-26 DIAGNOSIS — I2510 Atherosclerotic heart disease of native coronary artery without angina pectoris: Secondary | ICD-10-CM | POA: Insufficient documentation

## 2018-01-30 ENCOUNTER — Telehealth: Payer: Self-pay | Admitting: Pharmacist

## 2018-01-30 NOTE — Telephone Encounter (Signed)
Spoke with patient's son regarding warfarin monitoring - referred to [place] by Dr Haroldine Laws. They currently have a home INR meter through [facility]) and INR readings are currently going to his internal med physician back in [place] before he moved, although they have been having a bit of trouble with this. They would eventually like to switch to [place] but report they need a few weeks to get their paperwork straightened out. Internal med physician will continue to monitor INR for now, although with pt moving to [facility] will ultimately need local practice managing INRs. He stated that [place] [facility] required weekly INR testing - discussed that home monitoring vs in person INR check carry the same fee from our standpoint but typically with less frequent monitoring if pt comes to clinic. He will discuss this with his dad as well and will let us know when he would like [place] to start monitoring his INR.

## 2018-02-06 DIAGNOSIS — Z952 Presence of prosthetic heart valve: Secondary | ICD-10-CM | POA: Diagnosis not present

## 2018-02-08 DIAGNOSIS — Z952 Presence of prosthetic heart valve: Secondary | ICD-10-CM | POA: Diagnosis not present

## 2018-02-08 DIAGNOSIS — Z7901 Long term (current) use of anticoagulants: Secondary | ICD-10-CM | POA: Diagnosis not present

## 2018-02-09 ENCOUNTER — Encounter (HOSPITAL_COMMUNITY): Payer: Self-pay | Admitting: Nurse Practitioner

## 2018-02-09 ENCOUNTER — Ambulatory Visit (HOSPITAL_COMMUNITY)
Admission: RE | Admit: 2018-02-09 | Discharge: 2018-02-09 | Disposition: A | Discharge status: Home / Self Care | Payer: Medicare Other | Source: Ambulatory Visit | Attending: Nurse Practitioner | Admitting: Nurse Practitioner

## 2018-02-09 VITALS — BP 144/72 | HR 58 | Ht 62.0 in | Wt 155.0 lb

## 2018-02-09 DIAGNOSIS — I509 Heart failure, unspecified: Secondary | ICD-10-CM | POA: Diagnosis not present

## 2018-02-09 DIAGNOSIS — Z8546 Personal history of malignant neoplasm of prostate: Secondary | ICD-10-CM | POA: Diagnosis not present

## 2018-02-09 DIAGNOSIS — Z79899 Other long term (current) drug therapy: Secondary | ICD-10-CM | POA: Insufficient documentation

## 2018-02-09 DIAGNOSIS — I252 Old myocardial infarction: Secondary | ICD-10-CM | POA: Diagnosis not present

## 2018-02-09 DIAGNOSIS — Z23 Encounter for immunization: Secondary | ICD-10-CM | POA: Diagnosis not present

## 2018-02-09 DIAGNOSIS — I2510 Atherosclerotic heart disease of native coronary artery without angina pectoris: Secondary | ICD-10-CM | POA: Insufficient documentation

## 2018-02-09 DIAGNOSIS — Z880 Allergy status to penicillin: Secondary | ICD-10-CM | POA: Insufficient documentation

## 2018-02-09 DIAGNOSIS — L821 Other seborrheic keratosis: Secondary | ICD-10-CM | POA: Diagnosis not present

## 2018-02-09 DIAGNOSIS — I480 Paroxysmal atrial fibrillation: Secondary | ICD-10-CM | POA: Diagnosis not present

## 2018-02-09 DIAGNOSIS — I4891 Unspecified atrial fibrillation: Secondary | ICD-10-CM | POA: Insufficient documentation

## 2018-02-09 DIAGNOSIS — Z7901 Long term (current) use of anticoagulants: Secondary | ICD-10-CM | POA: Insufficient documentation

## 2018-02-09 DIAGNOSIS — E785 Hyperlipidemia, unspecified: Secondary | ICD-10-CM | POA: Diagnosis not present

## 2018-02-09 DIAGNOSIS — Z954 Presence of other heart-valve replacement: Secondary | ICD-10-CM | POA: Diagnosis not present

## 2018-02-09 DIAGNOSIS — I110 Hypertensive heart disease with heart failure: Secondary | ICD-10-CM | POA: Diagnosis not present

## 2018-02-09 DIAGNOSIS — L700 Acne vulgaris: Secondary | ICD-10-CM | POA: Diagnosis not present

## 2018-02-09 DIAGNOSIS — Z7982 Long term (current) use of aspirin: Secondary | ICD-10-CM | POA: Insufficient documentation

## 2018-02-09 NOTE — Progress Notes (Signed)
Primary Care Physician: System, Pcp Not In Referring Physician: Dr. Haroldine Laws Cardiologist: Dr. Rebeca Allegra (pending appointment) PCP: Dr. Gomez Cleverly (pending appointment)    Tanner Ferguson is a 84 y.o. male with a h/o aortic stenosis s/p mechanical AVR 1995, recently diagnosed CAD, MI in October, PAF with ICM, OSA, HTN, HL  and prostate CA. He was referred to the HF clinic by his cardiologist in [place], [place] to establish care for his PAF and CAD. Marland Kitchen He recently moved here to live with his son. He saw Dr. Haroldine Laws last week but thought it would be more appropriate for the pt to be followed by general cardiology. He is pending an appointment with Dr.B.  Christopher.   He was placed on amiodarone at the time of his MI in October. Per son he was unstable with the afib with RVR.Marland Kitchen He had a cardioversion prior to that which was successful but ERAF. I don't have records . They were given to Dr. Haroldine Laws and are in  the process of being scanned in.Echo did show EF of 40-45%. He had cath which showed severe CAD per family -> not amenable to PCI and not CABG candidate. Also developed PAF. Had DC-CV on 12/01/17. Was quite symptomatic so had DC-CV. AF recurred. Started on amio and had repeat DC-CV on 12/16/17.  He developed a worsening tremor with amiodarone. He had essential tremors prior to amio. Dr. Haroldine Laws advised to cut amio in half. This was not done for the fear of the pt and son of reoccurring afib as he does not want another cardioversion but equally frustrated with the worsening tremors as it interferes with his ability to feed himself.  Today, he denies symptoms of palpitations, chest pain, shortness of breath, orthopnea, PND, lower extremity edema, dizziness, presyncope, syncope, or neurologic sequela. The patient is tolerating medications without difficulties and is otherwise without complaint today.   Past Medical History:  Diagnosis Date  . CAD (coronary artery disease)    s/p NSTEMI  9/19. Cath in [place], [place] 10/19. Severe CAD not amenable to PCI. Not CABG candidate  . CHF (congestive heart failure) (HCC)    EF 40-45% by echo due to iCM. EF 36% by Myoview 9/19  . Essential tremor   . Hyperlipidemia    Intolerant of statins  . Hypertension   . PAF (paroxysmal atrial fibrillation) ([place])   . Prostate cancer [facility])     Current Outpatient Medications  Medication Sig Dispense Refill  . amiodarone (PACERONE) 200 MG tablet Take 200 mg by mouth daily.    Marland Kitchen aspirin EC 81 MG tablet Take 81 mg by mouth daily.    Marland Kitchen lisinopril (PRINIVIL,ZESTRIL) 5 MG tablet Take 10 mg by mouth daily.    . phenytoin (DILANTIN) 100 MG ER capsule Take 100 mg by mouth 2 (two) times daily.    . tamsulosin (FLOMAX) 0.4 MG CAPS capsule Take 0.4 mg by mouth daily.    Marland Kitchen warfarin (COUMADIN) 4 MG tablet Take 4 mg by mouth daily.     No current facility-administered medications for this encounter.     Allergies  Allergen Reactions  . Penicillins Rash    Social History   Socioeconomic History  . Marital status: Unknown    Spouse name: Not on file  . Number of children: Not on file  . Years of education: Not on file  . Highest education level: Not on file  Occupational History  . Not on file  Social Needs  . Emergency planning/management officer  strain: Not on file  . Food insecurity:    Worry: Not on file    Inability: Not on file  . Transportation needs:    Medical: Not on file    Non-medical: Not on file  Tobacco Use  . Smoking status: Never Smoker  . Smokeless tobacco: Former Systems developer    Types: Chew  Substance and Sexual Activity  . Alcohol use: Not on file  . Drug use: Not on file  . Sexual activity: Not on file  Lifestyle  . Physical activity:    Days per week: Not on file    Minutes per session: Not on file  . Stress: Not on file  Relationships  . Social connections:    Talks on phone: Not on file    Gets together: Not on file    Attends religious service: Not on file    Active member of club  or organization: Not on file    Attends meetings of clubs or organizations: Not on file    Relationship status: Not on file  . Intimate partner violence:    Fear of current or ex partner: Not on file    Emotionally abused: Not on file    Physically abused: Not on file    Forced sexual activity: Not on file  Other Topics Concern  . Not on file  Social History Narrative  . Not on file    No family history on file.  ROS- All systems are reviewed and negative except as per the HPI above  Physical Exam: Vitals:   02/09/18 1357  BP: (!) 144/72  Pulse: (!) 58  Weight: 70.3 kg  Height: 5\' 2"  (1.575 m)   Wt Readings from Last 3 Encounters:  02/09/18 70.3 kg  01/25/18 70.2 kg    Labs: Lab Results  Component Value Date   NA 131 (L) 01/25/2018   K 5.3 (H) 01/25/2018   CL 102 01/25/2018   CO2 24 01/25/2018   GLUCOSE 94 01/25/2018   BUN 16 01/25/2018   CREATININE 1.47 (H) 01/25/2018   CALCIUM 9.2 01/25/2018   Lab Results  Component Value Date   INR 3.79 01/14/2018   No results found for: CHOL, HDL, LDLCALC, TRIG   GEN- The patient is well appearing, alert and oriented x 3 today.   Head- normocephalic, atraumatic Eyes-  Sclera clear, conjunctiva pink Ears- hearing intact Oropharynx- clear Neck- supple, no JVP Lymph- no cervical lymphadenopathy Lungs- Clear to ausculation bilaterally, normal work of breathing Heart- Regular rate and rhythm, no murmurs, rubs or gallops, PMI not laterally displaced GI- soft, NT, ND, + BS Extremities- no clubbing, cyanosis, or edema MS- no significant deformity or atrophy Skin- no rash or lesion Psych- euthymic mood, full affect Neuro- strength and sensation are intact  EKG-sinus brady at 58 bpm, pr int 258 ms, qrs int 120 ms, qtc 373 ms Epic records reviewed   Assessment and Plan: 1. afib In SR with amiodarone 200 mg qd Has developed worsening tremors At this point want to hold off reducing amiodarone to 100 mg daily as he  fears return of afib His son would like to try this when he is established with general cardiology He is  pending an appointment with Dr. Harrell Gave  2. CAD S/p recent  NSTEMI  9/19 No anginal symptoms Continue  medical therapy Avoid BB 2/2 bradycardia  3. Mechanical AVR 1995 Pt does home INR testing and his PCP in [place] is advising re changes in warfarin afib clinic  as needed  Tanner Ferguson, [facility] [address] [place], [place] [phone]

## 2018-02-20 ENCOUNTER — Telehealth (HOSPITAL_COMMUNITY): Payer: Self-pay | Admitting: *Deleted

## 2018-02-20 NOTE — Telephone Encounter (Signed)
Referral received for pt to participate in cardiac rehab from Dr. Haroldine Laws.  Medical history reviewed, pt EF is too high for medicare guidelines for reimbursement.  Pt EF is 36%.  Pt also has severe CAD that is not amenable to PCI, not a candidate for CABG.  This is a contraindication for participation in Cardiac Rehab per ACSM guidelines.  Message left for pt.  Contact information provided. Cherre Huger, BSN Cardiac and Training and development officer

## 2018-03-07 DIAGNOSIS — Z952 Presence of prosthetic heart valve: Secondary | ICD-10-CM | POA: Diagnosis not present

## 2018-03-07 DIAGNOSIS — Z7901 Long term (current) use of anticoagulants: Secondary | ICD-10-CM | POA: Diagnosis not present

## 2018-03-09 ENCOUNTER — Ambulatory Visit (INDEPENDENT_AMBULATORY_CARE_PROVIDER_SITE_OTHER): Payer: Medicare Other | Admitting: Cardiology

## 2018-03-09 ENCOUNTER — Encounter: Payer: Self-pay | Admitting: Cardiology

## 2018-03-09 VITALS — BP 126/60 | HR 58 | Ht 62.0 in | Wt 159.0 lb

## 2018-03-09 DIAGNOSIS — Z7901 Long term (current) use of anticoagulants: Secondary | ICD-10-CM | POA: Diagnosis not present

## 2018-03-09 DIAGNOSIS — I2510 Atherosclerotic heart disease of native coronary artery without angina pectoris: Secondary | ICD-10-CM

## 2018-03-09 DIAGNOSIS — I2584 Coronary atherosclerosis due to calcified coronary lesion: Secondary | ICD-10-CM

## 2018-03-09 DIAGNOSIS — I255 Ischemic cardiomyopathy: Secondary | ICD-10-CM | POA: Diagnosis not present

## 2018-03-09 DIAGNOSIS — Z952 Presence of prosthetic heart valve: Secondary | ICD-10-CM | POA: Diagnosis not present

## 2018-03-09 DIAGNOSIS — I5022 Chronic systolic (congestive) heart failure: Secondary | ICD-10-CM | POA: Diagnosis not present

## 2018-03-09 DIAGNOSIS — I480 Paroxysmal atrial fibrillation: Secondary | ICD-10-CM | POA: Diagnosis not present

## 2018-03-09 MED ORDER — ROSUVASTATIN CALCIUM 20 MG PO TABS: 20.0000 mg | ORAL_TABLET | Freq: Every day | ORAL | 3 refills | Status: DC

## 2018-03-09 MED ORDER — AMIODARONE HCL 100 MG PO TABS: 100.0000 mg | ORAL_TABLET | Freq: Every day | ORAL | 3 refills | Status: DC

## 2018-03-09 NOTE — Progress Notes (Signed)
Cardiology Office Note:    Date:  03/09/2018   ID:  Tanner Ferguson, DOB [birth year], MRN [id number]  PCP:  System, Pcp Not In  Cardiologist:  Buford Dresser, MD PhD  Referring MD: No ref. provider found   CC: Establish care with general cardiology  History of Present Illness:    Tanner Ferguson is a 85 y.o. male with a hx of aortic stenosis s/p mechanical AVR (1995), CAD with MI 11/2017, ischemic cardiomyopathy, paroxysmal atrial fibrillation, obstructive sleep apnea, hypertension, hyperlipidemia who is seen as a new patient to me for management of the above issues. He has previously been seen by Dr. Haroldine Laws and Roderic Palau.   Previously followed by a cardiologist in [place], [place]. Requested his records through [facility] today.   Main concerns today per patient's son and daughter-in-law (who is a Immunologist): -patient had been independent and living alone in [place], [place] until fall 2019. 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 Used to be very active, able to mow his >2 acre yard in the past. Since moving, he is largely sedentary.  -Reviewed prior notes and records in [place]. Had hospitalization in 10/2017 for cough, had jaw pain and found to have MI, Tn peak >10. Medically managed initially, but cath in 11/2017 showed severe CAD (cath notes reviewed). Also had rapid afib, cardioverted twice, loaded with amiodarone. -currently doing home INR checks, right now followed by Dr. Delfin Edis. They are going to meet new PCP Dr. Deforest Hoyles in near future, will see if his office will manage this. Offered office management through our anticoag clinic as well if they are interested. -paroxysmal afib RVR in the past; prior episodes have been related to loss of his children. -Had essential tremor prior to starting amiodarone, but much worse now. Discussed pros and cons of amiodarone, need for monitoring PFTs, TFTs, LFTs. -CAD is not amenable to PCI or CABG. Worsening cognitive  dysfunction on rosuvastatin possibly, though they are also not sure if this is unmasking of prior cognitive difficulties.  -planning for cardiac rehab, have the information on this.  No current chest pain. Minimal activity, no chest pain on exertion. Family notes SOB/wheezing when climbing stairs. No syncope, no palpitations. No PND, orthopnea, LE edema.   Past Medical History:  Diagnosis Date  . CAD (coronary artery disease)    s/p NSTEMI 9/19. Cath in [place], [place] 10/19. Severe CAD not amenable to PCI. Not CABG candidate  . CHF (congestive heart failure) (HCC)    EF 40-45% by echo due to iCM. EF 36% by Myoview 9/19  . Essential tremor   . Hyperlipidemia    Intolerant of statins  . Hypertension   . PAF (paroxysmal atrial fibrillation) ([place])   . Prostate cancer [facility])     History reviewed. No pertinent surgical history.  Current Medications: Current Outpatient Medications on File Prior to Visit  Medication Sig  . aspirin EC 81 MG tablet Take 81 mg by mouth daily.  Marland Kitchen lisinopril (PRINIVIL,ZESTRIL) 5 MG tablet Take 10 mg by mouth daily.  . phenytoin (DILANTIN) 100 MG ER capsule Take 100 mg by mouth 2 (two) times daily.  . tamsulosin (FLOMAX) 0.4 MG CAPS capsule Take 0.4 mg by mouth daily.  Marland Kitchen warfarin (COUMADIN) 4 MG tablet Take 4 mg by mouth daily.   No current facility-administered medications on file prior to visit.      Allergies:   Penicillins   Social History   Socioeconomic History  . Marital status:  Unknown    Spouse name: Not on file  . Number of children: Not on file  . Years of education: Not on file  . Highest education level: Not on file  Occupational History  . Not on file  Social Needs  . Financial resource strain: Not on file  . Food insecurity:    Worry: Not on file    Inability: Not on file  . Transportation needs:    Medical: Not on file    Non-medical: Not on file  Tobacco Use  . Smoking status: Never Smoker  . Smokeless tobacco: Former Systems developer     Types: Chew  Substance and Sexual Activity  . Alcohol use: Not on file  . Drug use: Not on file  . Sexual activity: Not on file  Lifestyle  . Physical activity:    Days per week: Not on file    Minutes per session: Not on file  . Stress: Not on file  Relationships  . Social connections:    Talks on phone: Not on file    Gets together: Not on file    Attends religious service: Not on file    Active member of club or organization: Not on file    Attends meetings of clubs or organizations: Not on file    Relationship status: Not on file  Other Topics Concern  . Not on file  Social History Narrative  . Not on file     Family History: No FH of premature CAD  ROS:   Please see the history of present illness.  Additional pertinent ROS:  Constitutional: Negative for chills, fever, night sweats, unintentional weight loss  HENT: Negative for ear pain and hearing loss.   Eyes: Negative for loss of vision and eye pain.  Respiratory: Positive for dyspnea on exertion. Negative for cough, sputum, shortness of breath at rest.   Cardiovascular: Negative for chest pain, palpitations, PND, orthopnea, lower extremity edema and claudication.  Gastrointestinal: Negative for abdominal pain, melena, and hematochezia.  Genitourinary: Negative for dysuria and hematuria.  Musculoskeletal: Negative for falls and myalgias.  Skin: Negative for itching and rash.  Neurological: Negative for focal weakness, focal sensory changes and loss of consciousness.  Positive for worsening tremor. Endo/Heme/Allergies: Does not bruise/bleed easily.    EKGs/Labs/Other Studies Reviewed:    The following studies were reviewed today: [facility] Everywhere Cath 12/16/17  HEMODYNAMIC AND SATURATIONS:   LOCATION  PRESSURE  SATURATION   RA  02/09/14  52%   RV  40/7/13    PA  40/21/26  51%   PCWP  13/14/14    CO          1.9L/min      AO  140/64/86  95%    ANGIOGRAPHIC  DATA:   LV angiography: Was omitted as we did not cross the mechanical valve  Aortogram: Was omitted due to poor renal function. Fluoroscopic  examination of the aortic valve revealed a double tilting disc that  appears to be moving freely and functioning well  Coronary Anatomy: The coronary tree was calcified.  Dominance: Codominant  Left main: The left main coronary artery had a 10% narrowing in the  distal portion.  LAD: The left anterior descending artery has a 70% ostial narrowing.  Then it had an 85% stenosis just prior to the takeoff of the diagonal  branch. The diagonal branch was moderate in size and had an early  bifurcation. The larger of the 2 branches had an 80% stenosis. The  mid  LAD was severely and diffusely diseased and subtotally occluded with faint  filling distally. The distal LAD is not visualized  LCx: The circumflex artery is a moderate-size codominant blood vessel  has a 15% narrowing in the proximal portion. The 1st obtuse marginal  branch is moderate in size and has a 60% ostial narrowing followed by a  75% stenosis in the proximal 1/3. The continuation of the circumflex  artery is diffusely diseased. The distal circumflex artery gives rise to  what appears to be a small obtuse marginal branch that is totally occluded  at the ostium. The circumflex then continues as a small blood vessel in  the AV groove segment.  RCA: The right coronary artery is a moderate-size codominant and  diffusely diseased blood vessel. It has a 75% irregular stenosis in the  midportion. The distal portion around the distal bend has an 85%  irregular stenosis. The PDA is small in caliber and has an 80% ostial  stenosis. The AV groove segment is diffusely diseased with a maximal  narrowing of 50% the 1st posterolateral branch is moderate in size and is  totally occluded and fills retrograde via collaterals. The 2nd obtuse  marginal branch is small and  totally occluded in the midportion.   R CFA: The right common femoral artery is patent and suitable for  closure.  SUMMARY:   Severe 3 vessel coronary artery disease.  Severely depressed cardiac output likely due to the atrial fibrillation.  Well functioning mechanical aortic valve.  Mildly elevated right-sided pressures.  PLAN:  Proceed with synchronized DC cardioversion.  Consider options of medical therapy verses percutaneous  revascularization verses surgical revascularization with the patient and  his family.  Echo 11/04/17 Mild left ventricular systolic dysfunction Ejection Fraction = 40-45%. Regional wall motion consistent with ischemic cardiomyopathy. Mild concentric LV hypertrophy Moderately dilated left atrium There is mild mitral annular calcification. Mild mitral regurgitation. Normally functioning prosthetic aortic valve. Moderate tricuspid regurgitation. Right ventricular systolic pressure is 41-96 mm Hg suggesting mild pulmonary hypertension.  Lexiscan 11/06/17 MYOCARDIAL PERFUSION GATED SPECT ANALYSIS RESTING LV EF The Rest LV EF (Calculated) is 36%. STRESS LV EF Stress LV EF (Calculated) is 34%. TID: 1.28. CHAMBER FUNCTIONAL ANALYSIS Rest/Stress LV chamber size is normal. There is apical akinesis. MYOCARDIAL PERFUSION IMAGES There is a large fixed apical defect. There is a mild to moderate amount of reversibility with moderate intensity in the mid inferior and mid anterior walls.  CONCLUSIONS  Normal stress ECG with pharmacologic stress. Large fixed apical defect with mild-to-moderate amount of reversibility in the mid anterior and mid inferior walls. Reversible distribution accounts for 10-15% of the overall myocardium. There are regional wall motion abnormalities as specified. Findings suggestive of Ischemic cardiomyopathy.  EKG:  EKG is personally reviewed.  The ekg ordered today demonstrates sinus bradycardia, 1st degree AV block, IVCD,  flattened T waves  Recent Labs: 01/25/2018: ALT 12; B Natriuretic Peptide 328.3; BUN 16; Creatinine, Ser 1.47; Hemoglobin 12.7; Platelets 128; Potassium 5.3; Sodium 131  Recent Lipid Panel No results found for: CHOL, TRIG, HDL, CHOLHDL, VLDL, LDLCALC, LDLDIRECT  Physical Exam:    VS:  BP 126/60 (BP Location: Right Arm, Patient Position: Sitting, Cuff Size: Normal)   Pulse (!) 58   Ht 5\' 2"  (1.575 m)   Wt 159 lb (72.1 kg)   BMI 29.08 kg/m     Wt Readings from Last 3 Encounters:  03/09/18 159 lb (72.1 kg)  02/09/18 155 lb (70.3 kg)  01/25/18 154  lb 12.8 oz (70.2 kg)     GEN: Well nourished, well developed in no acute distress HEENT: Normal NECK: No JVD; No carotid bruits LYMPHATICS: No lymphadenopathy CARDIAC: regular rhythm, normal S1 and crisp mechanical S2, no murmurs, rubs, gallops. Radial and DP pulses 2+ bilaterally. RESPIRATORY:  Decreased breath sounds without consolidation, wheezing or rales  ABDOMEN: Soft, non-tender, non-distended MUSCULOSKELETAL:  No edema; No deformity  SKIN: Warm and dry NEUROLOGIC:  Alert, ambulates slowly but independently. Diffuse resting and intention tremor (seen in hands, bilateral arms, etc) PSYCHIATRIC:  Normal affect   ASSESSMENT:    1. Paroxysmal atrial fibrillation (HCC)   2. Coronary artery disease due to calcified coronary lesion   3. Chronic systolic heart failure ([place])   4. History of mechanical aortic valve replacement   5. Anticoagulant long-term use   6. Ischemic cardiomyopathy    PLAN:    1. Paroxysmal atrial fibrillation -very symptomatic when in afib RVR, required 2 cardioversions in the fall -has structural heart disease and CAD. Family is concerned understandably about amiodarone, but when we discussed dofetilide they preferred to stay on amiodarone. They would like to trial dropping to 100 mg dose to see if this helps with tremors, and I agree that we can try and see what happens -needs PFTs, TFTs, and LFTs followed.  They will discuss with PCP at upcoming visit, but if not done we will order -currently using home INR monitor and remote adjustments made by PCP in [place]. Have had issues with lability. Given plan to drop amiodarone, recommended more frequent monitoring. Offered to establish them with our anticoagulation clinic if they elect to no longer use home monitor. They will let me know if they wish to pursue this. -CHA2DS2/VAS Stroke Risk Points=4, but irrelevant as he needs to be on coumadin for his mechanical valve. -has seen Roderic Palau in the afib clinic to establish care  2. CAD s/p NSTEMI, with diffuse severe disease seen on cath -continue aspirin -discussed concerns re: rosuvastatin with patient and family. They are ok with continuing for now -resting sinus bradycardia. No room for beta blocker -no chest pain, but prior symptoms was shortness of breath -medical management  3. Chronic systolic heart failure, 2/2 ischemic cardiomyopathy. EF 40-45% -euvolemic today -no beta blocker due to bradycardia -continue lisinopril, watch potassium (last 5.3) -given baseline elevated potassium, no room for spironolactone -per Dr. Clayborne Dana note, discussed entresto as an alternative to lisinopril. Has BP room, would be careful given baseline elevated potassium. Want to make slow changes, rediscuss at future visit.  4. History of mechanical AVR, on coumadin -valve is crisp, reported as functioning well under fluroscopy -continue coumadin and aspirin -SBE prophylaxis with dental procedures  Plan for follow up: 3 mos or sooner PRN  TIME SPENT WITH PATIENT: >55 minutes of direct patient care. More than 50% of that time was spent on coordination of care and counseling regarding options for management of atrial fibrillation, CAD, reduced EF.  Buford Dresser, MD, PhD [place]  [facility]   Medication Adjustments/Labs and Tests Ordered: Current medicines are reviewed at length with the  patient today.  Concerns regarding medicines are outlined above.  Orders Placed This Encounter  Procedures  . EKG 12-Lead   Meds ordered this encounter  Medications  . amiodarone (PACERONE) 100 MG tablet    Sig: Take 1 tablet (100 mg total) by mouth daily.    Dispense:  90 tablet    Refill:  3  . rosuvastatin (CRESTOR) 20  MG tablet    Sig: Take 1 tablet (20 mg total) by mouth daily.    Dispense:  90 tablet    Refill:  3    Patient Instructions  Medication Instructions:  Decrease: Amiodarone 100 mg daily  If you need a refill on your cardiac medications before your next appointment, please call your pharmacy.   Lab work: None.  Testing/Procedures: None  Follow-Up: At [facility], you and your health needs are our priority.  As part of our continuing mission to provide you with exceptional heart care, we have created designated Provider Care Teams.  These Care Teams include your primary Cardiologist (physician) and Advanced Practice Providers (APPs -  Physician Assistants and Nurse Practitioners) who all work together to provide you with the care you need, when you need it. You will need a follow up appointment in 3 months.  Please call our office 2 months in advance to schedule this appointment.  You may see Buford Dresser, MD or one of the following Advanced Practice Providers on your designated Care Team:   Rosaria Ferries, PA-C . Jory Sims, DNP, ANP       Signed, Buford Dresser, MD PhD 03/09/2018 10:58 PM    [place]

## 2018-03-09 NOTE — Patient Instructions (Signed)
Medication Instructions:  Decrease: Amiodarone 100 mg daily  If you need a refill on your cardiac medications before your next appointment, please call your pharmacy.   Lab work: None.  Testing/Procedures: None  Follow-Up: At [facility], you and your health needs are our priority.  As part of our continuing mission to provide you with exceptional heart care, we have created designated Provider Care Teams.  These Care Teams include your primary Cardiologist (physician) and Advanced Practice Providers (APPs -  Physician Assistants and Nurse Practitioners) who all work together to provide you with the care you need, when you need it. You will need a follow up appointment in 3 months.  Please call our office 2 months in advance to schedule this appointment.  You may see Buford Dresser, MD or one of the following Advanced Practice Providers on your designated Care Team:   Rosaria Ferries, PA-C . Jory Sims, DNP, ANP

## 2018-03-10 ENCOUNTER — Other Ambulatory Visit: Payer: Self-pay | Admitting: Cardiology

## 2018-03-10 DIAGNOSIS — I480 Paroxysmal atrial fibrillation: Secondary | ICD-10-CM

## 2018-03-13 MED ORDER — AMIODARONE HCL 200 MG PO TABS: 100.0000 mg | ORAL_TABLET | Freq: Every day | ORAL | 3 refills | Status: DC

## 2018-03-13 NOTE — Telephone Encounter (Signed)
Can you change this to 200 mg tab, 1/2 tab daily for his insurance? Thank you!

## 2018-03-16 ENCOUNTER — Other Ambulatory Visit (HOSPITAL_COMMUNITY): Payer: Self-pay | Admitting: *Deleted

## 2018-03-16 DIAGNOSIS — I214 Non-ST elevation (NSTEMI) myocardial infarction: Secondary | ICD-10-CM

## 2018-03-22 ENCOUNTER — Telehealth (HOSPITAL_COMMUNITY): Payer: Self-pay

## 2018-03-22 NOTE — Telephone Encounter (Signed)
Pt insurance is active and benefits verified through Medicare A/B. Co-pay $0.00, DED $198.00/$142.34 met, out of pocket $0.00/$0.00 met, co-insurance 20%. No pre-authorization required. Passport, 03/22/2018 @ 4:24PM, REF# [id number]  2ndary  insurance is active and benefits verified through [place]. Co-pay $0.00, DED $0.00/$0.00 met, out of pocket $0.00/$0.00 met, co-insurance 2%. No pre-authorization required. Passport, 03/22/2018 @ 4:26PM, REF# [id number]

## 2018-03-24 ENCOUNTER — Telehealth (HOSPITAL_COMMUNITY): Payer: Self-pay

## 2018-03-24 ENCOUNTER — Telehealth: Payer: Self-pay | Admitting: Cardiology

## 2018-03-24 NOTE — Telephone Encounter (Signed)
Pt son Dominica Severin called and stated pt would like to participate in CR. Patient will come in for orientation on 04/11/2018 @ 130PM and will attend the 1:15PM exercise class. Went over [employer], he verbalized understanding.   Mailed homework package.

## 2018-03-24 NOTE — Telephone Encounter (Signed)
Spoke with son and he has an autistic son who he has to take and pick up from school. Cardiac rehab times were not good for this situation and he was wondering where he could go instead. Son would prefer patient go to [facility]. I advised son would reach out to see if anything could be worked out. I sent staff message to cardiac rehab, will await response.

## 2018-03-24 NOTE — Telephone Encounter (Signed)
Received message from cardiac rehab and spoke with son. He is going to proceed with [facility] cardiac rehab at this time.

## 2018-03-24 NOTE — Telephone Encounter (Signed)
Called and spoke with pt son Tanner Ferguson again about CR class times here at [facility]. I did adv him we have 645, 815, 1115, 115 and 245 class times available. He stated the 11:15 or 1:15 class time will work for him, but was not sure yet. He will speck with his wife and give [place] a call back later.

## 2018-03-24 NOTE — Telephone Encounter (Signed)
New Message    Patient calling about suggestions for any other cardiac rehab facility besides the one Dr. Harrell Gave already spoke to him about due to scheduling issues with that particular facility.

## 2018-04-03 DIAGNOSIS — I2510 Atherosclerotic heart disease of native coronary artery without angina pectoris: Secondary | ICD-10-CM | POA: Diagnosis not present

## 2018-04-03 DIAGNOSIS — I509 Heart failure, unspecified: Secondary | ICD-10-CM | POA: Diagnosis not present

## 2018-04-03 DIAGNOSIS — I252 Old myocardial infarction: Secondary | ICD-10-CM | POA: Diagnosis not present

## 2018-04-03 DIAGNOSIS — Z7901 Long term (current) use of anticoagulants: Secondary | ICD-10-CM | POA: Diagnosis not present

## 2018-04-03 DIAGNOSIS — Z954 Presence of other heart-valve replacement: Secondary | ICD-10-CM | POA: Diagnosis not present

## 2018-04-03 DIAGNOSIS — Z952 Presence of prosthetic heart valve: Secondary | ICD-10-CM | POA: Diagnosis not present

## 2018-04-03 DIAGNOSIS — G250 Essential tremor: Secondary | ICD-10-CM | POA: Diagnosis not present

## 2018-04-03 DIAGNOSIS — I700 Atherosclerosis of aorta: Secondary | ICD-10-CM | POA: Diagnosis not present

## 2018-04-03 DIAGNOSIS — R569 Unspecified convulsions: Secondary | ICD-10-CM | POA: Diagnosis not present

## 2018-04-03 DIAGNOSIS — Z1389 Encounter for screening for other disorder: Secondary | ICD-10-CM | POA: Diagnosis not present

## 2018-04-03 DIAGNOSIS — I10 Essential (primary) hypertension: Secondary | ICD-10-CM | POA: Diagnosis not present

## 2018-04-03 DIAGNOSIS — I4891 Unspecified atrial fibrillation: Secondary | ICD-10-CM | POA: Diagnosis not present

## 2018-04-03 DIAGNOSIS — C61 Malignant neoplasm of prostate: Secondary | ICD-10-CM | POA: Diagnosis not present

## 2018-04-03 DIAGNOSIS — F0390 Unspecified dementia without behavioral disturbance: Secondary | ICD-10-CM | POA: Diagnosis not present

## 2018-04-04 ENCOUNTER — Telehealth (HOSPITAL_COMMUNITY): Payer: Self-pay

## 2018-04-04 NOTE — Telephone Encounter (Signed)
Pt son Dominica Severin called and cancel orientation, offer to reschedule and he stated not at this time.  Closed referral

## 2018-04-11 ENCOUNTER — Ambulatory Visit (HOSPITAL_COMMUNITY): Payer: Medicare Other

## 2018-04-19 ENCOUNTER — Ambulatory Visit (HOSPITAL_COMMUNITY): Payer: Medicare Other

## 2018-04-21 ENCOUNTER — Ambulatory Visit (HOSPITAL_COMMUNITY): Payer: Medicare Other

## 2018-04-21 DIAGNOSIS — J3489 Other specified disorders of nose and nasal sinuses: Secondary | ICD-10-CM | POA: Diagnosis not present

## 2018-04-21 DIAGNOSIS — K120 Recurrent oral aphthae: Secondary | ICD-10-CM | POA: Diagnosis not present

## 2018-04-24 ENCOUNTER — Ambulatory Visit (HOSPITAL_COMMUNITY): Payer: Medicare Other

## 2018-04-26 ENCOUNTER — Ambulatory Visit (HOSPITAL_COMMUNITY): Payer: Medicare Other

## 2018-04-28 ENCOUNTER — Ambulatory Visit (HOSPITAL_COMMUNITY): Payer: Medicare Other

## 2018-05-01 ENCOUNTER — Ambulatory Visit (HOSPITAL_COMMUNITY): Payer: Medicare Other

## 2018-05-01 DIAGNOSIS — Z952 Presence of prosthetic heart valve: Secondary | ICD-10-CM | POA: Diagnosis not present

## 2018-05-01 DIAGNOSIS — Z7901 Long term (current) use of anticoagulants: Secondary | ICD-10-CM | POA: Diagnosis not present

## 2018-05-03 ENCOUNTER — Ambulatory Visit (HOSPITAL_COMMUNITY): Payer: Medicare Other

## 2018-05-05 ENCOUNTER — Ambulatory Visit (HOSPITAL_COMMUNITY): Payer: Medicare Other

## 2018-05-08 ENCOUNTER — Ambulatory Visit (HOSPITAL_COMMUNITY): Payer: Medicare Other

## 2018-05-10 ENCOUNTER — Ambulatory Visit (HOSPITAL_COMMUNITY): Payer: Medicare Other

## 2018-05-12 ENCOUNTER — Ambulatory Visit (HOSPITAL_COMMUNITY): Payer: Medicare Other

## 2018-05-15 ENCOUNTER — Ambulatory Visit (HOSPITAL_COMMUNITY): Payer: Medicare Other

## 2018-05-17 ENCOUNTER — Ambulatory Visit (HOSPITAL_COMMUNITY): Payer: Medicare Other

## 2018-05-19 ENCOUNTER — Ambulatory Visit (HOSPITAL_COMMUNITY): Payer: Medicare Other

## 2018-05-22 ENCOUNTER — Ambulatory Visit (HOSPITAL_COMMUNITY): Payer: Medicare Other

## 2018-05-24 ENCOUNTER — Ambulatory Visit (HOSPITAL_COMMUNITY): Payer: Medicare Other

## 2018-05-26 ENCOUNTER — Ambulatory Visit (HOSPITAL_COMMUNITY): Payer: Medicare Other

## 2018-05-29 ENCOUNTER — Ambulatory Visit (HOSPITAL_COMMUNITY): Payer: Medicare Other

## 2018-05-29 DIAGNOSIS — Z7901 Long term (current) use of anticoagulants: Secondary | ICD-10-CM | POA: Diagnosis not present

## 2018-05-29 DIAGNOSIS — I4821 Permanent atrial fibrillation: Secondary | ICD-10-CM | POA: Diagnosis not present

## 2018-05-31 ENCOUNTER — Ambulatory Visit (HOSPITAL_COMMUNITY): Payer: Medicare Other

## 2018-06-02 ENCOUNTER — Telehealth: Payer: Self-pay

## 2018-06-02 ENCOUNTER — Ambulatory Visit (HOSPITAL_COMMUNITY): Payer: Medicare Other

## 2018-06-02 NOTE — Telephone Encounter (Signed)
TELEPHONE CALL NOTE  Tanner Ferguson has been deemed a candidate for a follow-up tele-health visit to limit community exposure during the Covid-19 pandemic. I spoke with the patient via phone to ensure availability of phone/video source, confirm preferred email & phone number, and discuss instructions and expectations.  I reminded Tanner Ferguson to be prepared with any vital sign and/or heart rhythm information that could potentially be obtained via home monitoring, at the time of his visit. I reminded Tanner Ferguson to expect a phone call at the time of his visit if his visit.  Did the patient verbally acknowledge consent to treatment? Patient and Son provided verbal consent.   Tanner Ferguson, [place] 06/02/2018 12:15 PM   DOWNLOADING THE [place], go to [employer] and type in WebEx in the search bar. [place] [employer], the blue/green circle. The app is free but as with any other app downloads, their phone may require them to verify saved payment information or Apple password. The patient does NOT have to create an account.  - If Android, ask patient to go to [employer] and type in WebEx in the search bar. [place] [employer], the blue/green circle. The app is free but as with any other app downloads, their phone may require them to verify saved payment information or Android password. The patient does NOT have to create an account.   CONSENT FOR TELE-HEALTH VISIT - PLEASE REVIEW  I hereby voluntarily request, consent and authorize [facility] and its employed or contracted physicians, physician assistants, nurse practitioners or other licensed health care professionals (the Practitioner), to provide me with telemedicine health care services (the "Services") as deemed necessary by the treating Practitioner. I acknowledge and consent to receive the Services by the Practitioner via telemedicine. I understand that the telemedicine visit  will involve communicating with the Practitioner through live audiovisual communication technology and the disclosure of certain medical information by electronic transmission. I acknowledge that I have been given the opportunity to request an in-person assessment or other available alternative prior to the telemedicine visit and am voluntarily participating in the telemedicine visit.  I understand that I have the right to withhold or withdraw my consent to the use of telemedicine in the course of my care at any time, without affecting my right to future care or treatment, and that the Practitioner or I may terminate the telemedicine visit at any time. I understand that I have the right to inspect all information obtained and/or recorded in the course of the telemedicine visit and may receive copies of available information for a reasonable fee.  I understand that some of the potential risks of receiving the Services via telemedicine include:  Marland Kitchen Delay or interruption in medical evaluation due to technological equipment failure or disruption; . Information transmitted may not be sufficient (e.g. poor resolution of images) to allow for appropriate medical decision making by the Practitioner; and/or  . In rare instances, security protocols could fail, causing a breach of personal health information.  Furthermore, I acknowledge that it is my responsibility to provide information about my medical history, conditions and care that is complete and accurate to the best of my ability. I acknowledge that Practitioner's advice, recommendations, and/or decision may be based on factors not within their control, such as incomplete or inaccurate data provided by me or distortions of diagnostic images or specimens that may result from electronic transmissions. I understand that the practice of  medicine is not an Chief Strategy Officer and that Practitioner makes no warranties or guarantees regarding treatment outcomes. I acknowledge  that I will receive a copy of this consent concurrently upon execution via email to the email address I last provided but may also request a printed copy by calling the office of [place].    I understand that my insurance will be billed for this visit.   I have read or had this consent read to me. . I understand the contents of this consent, which adequately explains the benefits and risks of the Services being provided via telemedicine.  . I have been provided ample opportunity to ask questions regarding this consent and the Services and have had my questions answered to my satisfaction. . I give my informed consent for the services to be provided through the use of telemedicine in my medical care  By participating in this telemedicine visit I agree to the above.

## 2018-06-05 ENCOUNTER — Ambulatory Visit (HOSPITAL_COMMUNITY): Payer: Medicare Other

## 2018-06-06 ENCOUNTER — Other Ambulatory Visit: Payer: Self-pay

## 2018-06-06 ENCOUNTER — Telehealth: Payer: Self-pay | Admitting: Cardiology

## 2018-06-06 NOTE — Telephone Encounter (Signed)
LVM FOR PRE REGISTRATION. I WILL CALL BACK 12:04PM [place]

## 2018-06-07 ENCOUNTER — Ambulatory Visit (HOSPITAL_COMMUNITY): Payer: Medicare Other

## 2018-06-07 ENCOUNTER — Telehealth (INDEPENDENT_AMBULATORY_CARE_PROVIDER_SITE_OTHER): Payer: Medicare Other | Admitting: Cardiology

## 2018-06-07 ENCOUNTER — Encounter: Payer: Self-pay | Admitting: Cardiology

## 2018-06-07 VITALS — BP 141/72 | HR 50 | Ht 62.0 in | Wt 154.6 lb

## 2018-06-07 DIAGNOSIS — Z952 Presence of prosthetic heart valve: Secondary | ICD-10-CM

## 2018-06-07 DIAGNOSIS — I480 Paroxysmal atrial fibrillation: Secondary | ICD-10-CM | POA: Diagnosis not present

## 2018-06-07 DIAGNOSIS — I2584 Coronary atherosclerosis due to calcified coronary lesion: Secondary | ICD-10-CM

## 2018-06-07 DIAGNOSIS — Z7901 Long term (current) use of anticoagulants: Secondary | ICD-10-CM

## 2018-06-07 DIAGNOSIS — I2510 Atherosclerotic heart disease of native coronary artery without angina pectoris: Secondary | ICD-10-CM

## 2018-06-07 DIAGNOSIS — I5022 Chronic systolic (congestive) heart failure: Secondary | ICD-10-CM

## 2018-06-07 DIAGNOSIS — I255 Ischemic cardiomyopathy: Secondary | ICD-10-CM

## 2018-06-07 NOTE — Patient Instructions (Signed)
Medication Instructions:  Your Physician recommend you continue on your current medication as directed.    If you need a refill on your cardiac medications before your next appointment, please call your pharmacy.   Lab work: None  Testing/Procedures: None  Follow-Up: Your physician recommends that you schedule a follow-up appointment in 3 months with Dr. Harrell Gave.

## 2018-06-07 NOTE — Progress Notes (Signed)
Virtual Visit via Video Note   This visit type was conducted due to national recommendations for restrictions regarding the COVID-19 Pandemic (e.g. social distancing) in an effort to limit this patient's exposure and mitigate transmission in our community.  Due to his co-morbid illnesses, this patient is at least at moderate risk for complications without adequate follow up.  This format is felt to be most appropriate for this patient at this time.  All issues noted in this document were discussed and addressed.  A limited physical exam was performed with this format.  Please refer to the patient's chart for his consent to telehealth for [facility].   Evaluation Performed:  Follow-up visit  Date:  06/07/2018   ID:  Tanner Ferguson, DOB [birth year], MRN [id number]  Patient Location: Home  Provider Location: Home  PCP:  Wenda Low, MD  Cardiologist:  Buford Dresser, MD  Electrophysiologist:  None   Chief Complaint:  Follow up  History of Present Illness:    Tanner Ferguson is a 85 y.o. male who presents via audio/video conferencing for a telehealth visit today.  Performed via video conference today.  The patient does not have symptoms concerning for COVID-19 infection (fever, chills, cough, or new shortness of breath).   Video with his son and daughter in law today. Sitting outside. Doing well overall. Has been trying to be more active, was looking forward to cardiac rehab but that is on hold with coronavirus. He has mild angina only with going up/down hills walking to the pond near his [place]. Does ok on flat surfaces. No weight change or LE swelling. Mild dry cough, treating for allergies, also has asbestos lunch disease. No severe shortness of breath. No fevers/chills. No syncope or palpitations.   Had discussed stopping amiodarone in the past, but as he is doing very well on the lower dose, will plan to continue. Has established with Dr. Lysle Rubens, who is managing is coumadin now  as well.   Past Medical History:  Diagnosis Date  . CAD (coronary artery disease)    s/p NSTEMI 9/19. Cath in [place], [place] 10/19. Severe CAD not amenable to PCI. Not CABG candidate  . CHF (congestive heart failure) (HCC)    EF 40-45% by echo due to iCM. EF 36% by Myoview 9/19  . Essential tremor   . Hyperlipidemia    Intolerant of statins  . Hypertension   . PAF (paroxysmal atrial fibrillation) ([place])   . Prostate cancer ([place])    No past surgical history on file.   Current Meds  Medication Sig  . amiodarone (PACERONE) 200 MG tablet Take 0.5 tablets (100 mg total) by mouth daily.  Marland Kitchen aspirin EC 81 MG tablet Take 81 mg by mouth daily.  Marland Kitchen lisinopril (PRINIVIL,ZESTRIL) 5 MG tablet Take 10 mg by mouth daily.  . phenytoin (DILANTIN) 100 MG ER capsule Take 100 mg by mouth daily.   . rosuvastatin (CRESTOR) 20 MG tablet Take 1 tablet (20 mg total) by mouth daily.  . tamsulosin (FLOMAX) 0.4 MG CAPS capsule Take 0.4 mg by mouth daily.  Marland Kitchen warfarin (COUMADIN) 1 MG tablet Take 1 mg by mouth daily. With the 2.5 mg for a total of 3.5 mg daily  . warfarin (COUMADIN) 2.5 MG tablet Take 2.5 mg by mouth daily. With 1 mg for a total of 3.5 mg     Allergies:   Penicillins   Social History   Tobacco Use  . Smoking status: Never Smoker  . Smokeless tobacco: Former Systems developer  Types: Chew  Substance Use Topics  . Alcohol use: Not on file  . Drug use: Not on file     Family Hx: No family history of premature CAD  ROS:   Please see the history of present illness.    All other systems reviewed and are negative.   Prior CV studies:   The following studies were reviewed today: Please see my note from 03/09/18 for full review of his prior studies.  Labs/Other Tests and Data Reviewed:    EKG:  An ECG dated 03/09/18 was personally reviewed today and demonstrated:  SR, 1st degree AV block, borderline IVCD  Recent Labs: 01/25/2018: ALT 12; B Natriuretic Peptide 328.3; BUN 16; Creatinine, Ser 1.47;  Hemoglobin 12.7; Platelets 128; Potassium 5.3; Sodium 131   Recent Lipid Panel No results found for: CHOL, TRIG, HDL, CHOLHDL, LDLCALC, LDLDIRECT  Wt Readings from Last 3 Encounters:  06/07/18 154 lb 9.6 oz (70.1 kg)  03/09/18 159 lb (72.1 kg)  02/09/18 155 lb (70.3 kg)     Objective:    Vital Signs:  BP (!) 141/72   Pulse (!) 50   Ht 5\' 2"  (1.575 m)   Wt 154 lb 9.6 oz (70.1 kg)   BMI 28.28 kg/m    Well nourished, well developed male in no acute distress. Sitting comfortably in chair Mild resting tremor Breathing comfortably, no increased work of breathing  ASSESSMENT & PLAN:    1. Paroxysmal atrial fibrillation -very symptomatic when in afib RVR, required 2 cardioversions in the fall -see prior discussing re: options for rhythm control. With structural hear disease and HF, only options are dofetilide and amiodarone, and they declined dofetilide. Continuing low dose amiodarone for now.  -needs PFTs, TFTs, and LFTs followed at next visit -coumadin monitored by Dr. Glenna Durand offive -CHA2DS2/VAS Stroke Risk Points=4, but irrelevant as he needs to be on coumadin for his mechanical valve.  2. CAD s/p NSTEMI, with diffuse severe disease seen on cath -continue aspirin -continue rosuvastatin -resting sinus bradycardia. No room for beta blocker -no chest pain, but prior symptoms was shortness of breath. Only stable exertional angina currently -medical management  3. Chronic systolic heart failure, 2/2 ischemic cardiomyopathy. EF 40-45% -euvolemic per report -no beta blocker due to bradycardia -continue lisinopril, watch potassium (last 5.3) -given baseline elevated potassium, no room for spironolactone -per Dr. Clayborne Dana note, discussed entresto as an alternative to lisinopril. Has BP room, would be careful given baseline elevated potassium. Want to make slow changes, rediscuss at future visit.  4. History of mechanical AVR, on coumadin -continue coumadin and aspirin -SBE  prophylaxis with dental procedures  COVID-19 Education: The signs and symptoms of COVID-19 were discussed with the patient and how to seek care for testing (follow up with PCP or arrange E-visit).  The importance of social distancing was discussed today.  Time:   Today, I have spent 17 minutes with the patient with telehealth technology discussing the above problems.    Patient Instructions  Medication Instructions:  Your Physician recommend you continue on your current medication as directed.    If you need a refill on your cardiac medications before your next appointment, please call your pharmacy.   Lab work: None  Testing/Procedures: None  Follow-Up: Your physician recommends that you schedule a follow-up appointment in 3 months with Dr. Harrell Gave.     Medication Adjustments/Labs and Tests Ordered: Current medicines are reviewed at length with the patient today.  Concerns regarding medicines are outlined above.  Tests Ordered:  No orders of the defined types were placed in this encounter.  Medication Changes: No orders of the defined types were placed in this encounter.   Disposition:  Follow up in 3 month(s)  Signed, Buford Dresser, MD  06/07/2018 11:36 AM    [facility]

## 2018-06-08 ENCOUNTER — Ambulatory Visit: Payer: Medicare Other | Admitting: Cardiology

## 2018-06-09 ENCOUNTER — Ambulatory Visit (HOSPITAL_COMMUNITY): Payer: Medicare Other

## 2018-06-12 ENCOUNTER — Ambulatory Visit (HOSPITAL_COMMUNITY): Payer: Medicare Other

## 2018-06-14 ENCOUNTER — Ambulatory Visit (HOSPITAL_COMMUNITY): Payer: Medicare Other

## 2018-06-16 ENCOUNTER — Ambulatory Visit (HOSPITAL_COMMUNITY): Payer: Medicare Other

## 2018-06-19 ENCOUNTER — Ambulatory Visit (HOSPITAL_COMMUNITY): Payer: Medicare Other

## 2018-06-21 ENCOUNTER — Ambulatory Visit (HOSPITAL_COMMUNITY): Payer: Medicare Other

## 2018-06-23 ENCOUNTER — Ambulatory Visit (HOSPITAL_COMMUNITY): Payer: Medicare Other

## 2018-06-26 ENCOUNTER — Ambulatory Visit (HOSPITAL_COMMUNITY): Payer: Medicare Other

## 2018-06-28 ENCOUNTER — Ambulatory Visit (HOSPITAL_COMMUNITY): Payer: Medicare Other

## 2018-06-29 DIAGNOSIS — I4821 Permanent atrial fibrillation: Secondary | ICD-10-CM | POA: Diagnosis not present

## 2018-06-29 DIAGNOSIS — Z7901 Long term (current) use of anticoagulants: Secondary | ICD-10-CM | POA: Diagnosis not present

## 2018-06-30 ENCOUNTER — Ambulatory Visit (HOSPITAL_COMMUNITY): Payer: Medicare Other

## 2018-07-03 ENCOUNTER — Ambulatory Visit (HOSPITAL_COMMUNITY): Payer: Medicare Other

## 2018-07-05 ENCOUNTER — Other Ambulatory Visit: Payer: Self-pay | Admitting: Internal Medicine

## 2018-07-05 ENCOUNTER — Ambulatory Visit (HOSPITAL_COMMUNITY): Payer: Medicare Other

## 2018-07-05 ENCOUNTER — Ambulatory Visit
Admission: RE | Admit: 2018-07-05 | Discharge: 2018-07-05 | Disposition: A | Discharge status: Home / Self Care | Payer: Medicare Other | Source: Ambulatory Visit | Attending: Internal Medicine | Admitting: Internal Medicine

## 2018-07-05 DIAGNOSIS — R0609 Other forms of dyspnea: Secondary | ICD-10-CM | POA: Diagnosis not present

## 2018-07-05 DIAGNOSIS — I4891 Unspecified atrial fibrillation: Secondary | ICD-10-CM | POA: Diagnosis not present

## 2018-07-05 DIAGNOSIS — R062 Wheezing: Secondary | ICD-10-CM

## 2018-07-05 DIAGNOSIS — R531 Weakness: Secondary | ICD-10-CM | POA: Diagnosis not present

## 2018-07-07 ENCOUNTER — Ambulatory Visit (HOSPITAL_COMMUNITY): Payer: Medicare Other

## 2018-07-10 ENCOUNTER — Ambulatory Visit (HOSPITAL_COMMUNITY): Payer: Medicare Other

## 2018-07-12 ENCOUNTER — Ambulatory Visit (HOSPITAL_COMMUNITY): Payer: Medicare Other

## 2018-07-14 ENCOUNTER — Ambulatory Visit (HOSPITAL_COMMUNITY): Payer: Medicare Other

## 2018-07-17 ENCOUNTER — Ambulatory Visit (HOSPITAL_COMMUNITY): Payer: Medicare Other

## 2018-07-19 ENCOUNTER — Ambulatory Visit (HOSPITAL_COMMUNITY): Payer: Medicare Other

## 2018-07-21 ENCOUNTER — Ambulatory Visit (HOSPITAL_COMMUNITY): Payer: Medicare Other

## 2018-07-24 DIAGNOSIS — I4821 Permanent atrial fibrillation: Secondary | ICD-10-CM | POA: Diagnosis not present

## 2018-07-24 DIAGNOSIS — Z7901 Long term (current) use of anticoagulants: Secondary | ICD-10-CM | POA: Diagnosis not present

## 2018-07-25 ENCOUNTER — Telehealth: Payer: Self-pay | Admitting: Cardiology

## 2018-07-25 NOTE — Telephone Encounter (Signed)
Pt son, Dominica Severin, called to report the patient has had extreme changes in his breathing over the past 2 weeks. Dr. Lysle Rubens did a chest xray on 07-05-18 showing no acute changes. Pt has known COPD with history of asbestos exposure years ago. However, Dr. Lysle Rubens is asking if the pt can possibly come off of the amiodarone for a period of time to see if this is exacerbating his symptoms.   Pt has tried beta blockers in the past but was taken off due to bradycardia with history of cardioversions. Pt's son reports the 2 times he had gone into A-fib needing intervention was after he had lost children, a son and a daughter. He believes could be the source of the uncontrolled A-fib.   Pt did not see Dr. Debroah Loop after his app with Dr. Harrell Gave 06-07-18 for possible PFT due to COVID-19.   They are asking for Dr. Judeth Cornfield recommendation regarding the amiodarone. Pt has no peripheral edema and is still able to lay flat at night. 2 weeks ago he was able to walk up the steps since he stays in their basement, but now it is a struggle due to his breathing. He was treated with albuterol for wheezing a couple weeks ago which did help, but something still is not right.   Will forward to Dr. Harrell Gave.

## 2018-07-25 NOTE — Telephone Encounter (Signed)
We can trial off of the amiodarone, but the half life is very long (>50 days), so it will take a long time to completely be out of his system. Just as a note, we have limited other options for his afib. He has had bradycardia in the past, so a beta blocker is problematic. His EF is not normal, so diltiazem is a risk. The only other medication for rhythm control is dofetilide, which requires him to get 5 doses in the hospital.   I would recommend pulmonary function tests as soon as he is able to better assess his breathing. He may need a referral to pulmonary +/- a high resolution chest CT depending on the workup.   Can we schedule a virtual visit the week of 6/15-6/19 to see how he is doing? Thank you!

## 2018-07-25 NOTE — Telephone Encounter (Signed)
Son of Patient called. He spoke with the PCP in regards to one of the patient's medications. The PCP suggested he speak with his Cardiologist, because the Cardiologist wrote the rx for  amiodarone (PACERONE) 200 MG tablet  Pt is having changes in his overall breathing.  He was not having any breathing difficulties when they spoke with Dr. Harrell Gave last.   The patient had a video visit with Dr. Lysle Rubens  2 weeks go, and noticed a significant decrease in breathing, as well as a strong wheezing. Dr. Lemmie Evens gave the pt an Albuterol inhaler, which has helped with the wheezing but not the overall difficulty breathing. Pt has COPD. He was not a smoker, but his Mother was. Pt has a CT two weeks ago. No pneumonia. His physical to be evaluated by pulmonologist was cancelled due to [place].  The family wants to know if it is safe for the patient to take a break from the medication. Please advise

## 2018-07-26 NOTE — Telephone Encounter (Signed)
Son updated with Dr. Judeth Cornfield recommendations and voiced that he would like to hold off on changing medication until he speak with her. Virtual visit scheduled for 6/1 at 9 am.

## 2018-07-27 ENCOUNTER — Telehealth: Payer: Self-pay | Admitting: Cardiology

## 2018-07-27 NOTE — Telephone Encounter (Signed)
Call [phone]/smartphone/ my chart via text/ consent/ pre reg completed

## 2018-07-31 ENCOUNTER — Telehealth (INDEPENDENT_AMBULATORY_CARE_PROVIDER_SITE_OTHER): Payer: Medicare Other | Admitting: Cardiology

## 2018-07-31 ENCOUNTER — Encounter: Payer: Self-pay | Admitting: Cardiology

## 2018-07-31 VITALS — BP 136/68 | HR 54 | Ht 62.0 in | Wt 154.0 lb

## 2018-07-31 DIAGNOSIS — Z79899 Other long term (current) drug therapy: Secondary | ICD-10-CM | POA: Diagnosis not present

## 2018-07-31 DIAGNOSIS — I255 Ischemic cardiomyopathy: Secondary | ICD-10-CM

## 2018-07-31 DIAGNOSIS — I2510 Atherosclerotic heart disease of native coronary artery without angina pectoris: Secondary | ICD-10-CM | POA: Diagnosis not present

## 2018-07-31 DIAGNOSIS — I480 Paroxysmal atrial fibrillation: Secondary | ICD-10-CM | POA: Diagnosis not present

## 2018-07-31 DIAGNOSIS — R062 Wheezing: Secondary | ICD-10-CM

## 2018-07-31 DIAGNOSIS — Z7901 Long term (current) use of anticoagulants: Secondary | ICD-10-CM

## 2018-07-31 DIAGNOSIS — I5022 Chronic systolic (congestive) heart failure: Secondary | ICD-10-CM

## 2018-07-31 DIAGNOSIS — Z952 Presence of prosthetic heart valve: Secondary | ICD-10-CM

## 2018-07-31 DIAGNOSIS — R0602 Shortness of breath: Secondary | ICD-10-CM

## 2018-07-31 DIAGNOSIS — Z7189 Other specified counseling: Secondary | ICD-10-CM

## 2018-07-31 DIAGNOSIS — I2584 Coronary atherosclerosis due to calcified coronary lesion: Secondary | ICD-10-CM | POA: Diagnosis not present

## 2018-07-31 NOTE — Patient Instructions (Signed)
Medication Instructions:  Hold amiodarone for 2 weeks  If you need a refill on your cardiac medications before your next appointment, please call your pharmacy.   Lab work: None  Testing/Procedures: Your physician has recommended that you have a pulmonary function test. Pulmonary Function Tests are a group of tests that measure how well air moves in and out of your lungs. [place] will be required to take a COVID-19 test prior to testing.  Follow-Up: At [facility], you and your health needs are our priority.  As part of our continuing mission to provide you with exceptional heart care, we have created designated Provider Care Teams.  These Care Teams include your primary Cardiologist (physician) and Advanced Practice Providers (APPs -  Physician Assistants and Nurse Practitioners) who all work together to provide you with the care you need, when you need it. You will need a follow up appointment in 2 weeks.  Please call our office 2 months in advance to schedule this appointment.  You may see Buford Dresser, MD or one of the following Advanced Practice Providers on your designated Care Team:   Rosaria Ferries, PA-C . Jory Sims, DNP, ANP

## 2018-07-31 NOTE — Progress Notes (Signed)
Virtual Visit via Video Note   This visit type was conducted due to national recommendations for restrictions regarding the COVID-19 Pandemic (e.g. social distancing) in an effort to limit this patient's exposure and mitigate transmission in our community.  Due to his co-morbid illnesses, this patient is at least at moderate risk for complications without adequate follow up.  This format is felt to be most appropriate for this patient at this time.  All issues noted in this document were discussed and addressed.  A limited physical exam was performed with this format.  Please refer to the patient's chart for his consent to telehealth for [facility].   Date:  07/31/2018   ID:  Tanner Ferguson, DOB 1927/05/15, MRN [id number]  Patient Location: Home Provider Location: Home  PCP:  Wenda Low, MD  Cardiologist:  Buford Dresser, MD Electrophysiologist:  None   Evaluation Performed:  Follow-Up Visit  Chief Complaint:  Shortness of breath and wheezing  History of Present Illness:    Tanner Ferguson is a 85 y.o. male with hx of aortic stenosis s/p mechanical AVR (1995), CAD with MI 11/2017, ischemic cardiomyopathy, paroxysmal atrial fibrillation, obstructive sleep apnea, hypertension, hyperlipidemia, reported COPD and asbestos exposure.  The patient does not have symptoms concerning for COVID-19 infection (fever, chills, cough) but does have progressive shortness of breath.   Patient is seen on video today, with daughter in law on video call and son conferenced in by phone. All have noticed worsening shortness of breath over the last two weeks. He has limited exertional ability due to his breathing, and daughter in law reports audible wheezing just with sitting still. He had a chest x-ray done by Dr. Lysle Rubens which did not show edema or pneumonia.   No fevers, no sick contacts. Has a cough, largely nonproductive. No edema. Weight has been stable ~154 lbs without any recent major  fluctuations. Taking medications. No clear PND/orthopnea.   He was recently started on an inhaler, which has helped some.   We discussed the amiodarone at length today, including short term and fulminant pulmonary toxicity from amiodarone as well as long term, gradual pulmonary toxicity.  We reviewed the difficult position we are in with his atrial fibrillation. He was very symptomatic with the afib in the fall (though family now wonders how much of his symptoms were pulmonary instead of the afib). With his structural heart disease, the only rhythm control agents we can use are amiodarone and dofetilide. We have discussed dofetilide in the past, and they do not wish to pursue given the need for hospital loading and strict administration times, as well as the risks associated with it. He did not tolerate beta blockers in the past due to bradycardia, and his low EF precludes calcium channel blockers. We could use digoxin as last resort, but this carries risks as well.    Past Medical History:  Diagnosis Date   CAD (coronary artery disease)    s/p NSTEMI 9/19. Cath in [place], [place] 10/19. Severe CAD not amenable to PCI. Not CABG candidate   CHF (congestive heart failure) (HCC)    EF 40-45% by echo due to iCM. EF 36% by Myoview 9/19   Essential tremor    Hyperlipidemia    Intolerant of statins   Hypertension    PAF (paroxysmal atrial fibrillation) (HCC)    Prostate cancer ([place])    No past surgical history on file.   Current Meds  Medication Sig   amiodarone (PACERONE) 200 MG tablet Take 0.5  tablets (100 mg total) by mouth daily.   aspirin EC 81 MG tablet Take 81 mg by mouth daily.   lisinopril (ZESTRIL) 10 MG tablet Take 10 mg by mouth daily.    phenytoin (DILANTIN) 100 MG ER capsule Take 100 mg by mouth daily.    rosuvastatin (CRESTOR) 20 MG tablet Take 1 tablet (20 mg total) by mouth daily.   tamsulosin (FLOMAX) 0.4 MG CAPS capsule Take 0.4 mg by mouth daily.   warfarin  (COUMADIN) 1 MG tablet Take 1 mg by mouth daily. With the 2.5 mg for a total of 3.5 mg daily   warfarin (COUMADIN) 2.5 MG tablet Take 2.5 mg by mouth daily. With 1 mg for a total of 3.5 mg     Allergies:   Penicillins   Social History   Tobacco Use   Smoking status: Never Smoker   Smokeless tobacco: Former Systems developer    Types: Chew  Substance Use Topics   Alcohol use: Not on file   Drug use: Not on file     Family Hx: No FH of premature CAD  ROS:   Please see the history of present illness.    Constitutional: Negative for chills, fever, night sweats, unintentional weight loss  HENT: Negative for ear pain and hearing loss.   Eyes: Negative for loss of vision and eye pain.  Respiratory: Positive for cough, sputum, wheezing.   Cardiovascular: See HPI. Gastrointestinal: Negative for abdominal pain, melena, and hematochezia.  Genitourinary: Negative for dysuria and hematuria.  Musculoskeletal: Negative for falls and myalgias.  Skin: Negative for itching and rash.  Neurological: Negative for focal weakness, focal sensory changes and loss of consciousness.  Endo/Heme/Allergies: Does bruise/bleed easily.  All other systems reviewed and are negative.   Prior CV studies:   The following studies were reviewed today: Recent CXR: my review, sharp costophrenic angles, no consolidation, no significant edema.  Labs/Other Tests and Data Reviewed:    EKG:  An ECG dated 03/09/18 was personally reviewed today and demonstrated:  sinus bradycardia, 1st degree AV block, IVCD, nonspecific ST changes  Recent Labs: 01/25/2018: ALT 12; B Natriuretic Peptide 328.3; BUN 16; Creatinine, Ser 1.47; Hemoglobin 12.7; Platelets 128; Potassium 5.3; Sodium 131   Recent Lipid Panel No results found for: CHOL, TRIG, HDL, CHOLHDL, LDLCALC, LDLDIRECT  Wt Readings from Last 3 Encounters:  07/31/18 154 lb (69.9 kg)  06/07/18 154 lb 9.6 oz (70.1 kg)  03/09/18 159 lb (72.1 kg)     Objective:    Vital  Signs:  BP 136/68    Pulse (!) 54    Ht 5\' 2"  (1.575 m)    Wt 154 lb (69.9 kg)    SpO2 96%    BMI 28.17 kg/m    VITAL SIGNS:  reviewed GEN:  no acute distress EYES:  sclerae anicteric, EOMI - Extraocular Movements Intact RESPIRATORY:  normal respiratory effort, symmetric expansion CARDIOVASCULAR:  no JVD visualized SKIN:  no rash, lesions or ulcers. MUSCULOSKELETAL:  resting tremor of hands NEURO:  resting tremor of hands PSYCH:  flat affect  ASSESSMENT & PLAN:    Shortness of breath, wheezing: Worsened over the last two weeks. Broad differential. No clear infectious triggers, no typical pneumonia visible on CXR. Reports history of COPD and asbestos exposure. No fevers, no increased mucus, but does have wheezing by report. I could not hear on the video but daughter in law reported that he was wheezing so loud she had to move the phone away. Could be bronchitis vs.  COPD exacerbation. No increase in weight, no LE edema, no edema on CXR suggestive of HF exacerbation. Cannot exclude amiodarone toxicity, though abrupt onset with chronic use less common presentation. Would also consider upper airway involvement, though daughter in law (who has medical background) says she can hear it at the bases as well. -ordered PFTs, esp DLCO. This is on hold with COVID but will be scheduled ASAP -recommend he see [facility] post PFTs for further evaluation. They will get referral from Dr. Glenna Durand office, or we can send if needed. He may need high res CT, will defer to pulm. -would consider empiric bronchitis treatment -discussed risk/benefit of stopping amiodarone. He is very symptomatic in afib, and we have limited other agents for control. However, cannot exclude amio toxicity. With the long half life of amiodarone, it will not be completely out of his system in 2 weeks, but it will be decreasing. If his symptoms improve, we may need to stop the amiodarone permanently and discuss options. If no change in  symptoms and/or workup points to another etiology, could discuss restarting amiodarone. -we will touch base virtually in 2 weeks to monitor symptoms closely -will need TFTs, LFTs at follow up.  Paroxysmal atrial fibrillation -very symptomatic when in afib RVR, required 2 cardioversions in the fall -coumadin monitored by Dr. Glenna Durand office. With holding amiodarone, his coumadin dose may need to be adjusted -CHA2DS2/VAS Stroke Risk Points=4, but irrelevant as he needs to be on coumadin for his mechanical valve.  CAD s/p NSTEMI, with diffuse severe disease seen on cath -continue aspirin -continue rosuvastatin -resting sinus bradycardia. No room for beta blocker -no chest pain, but prior symptoms was shortness of breath. Wheezing is new and not consistent with his prior symptoms, but need to continue to monitor closely. -medical management  Chronic systolic heart failure, 2/2 ischemic cardiomyopathy. EF 40-45% -euvolemic per report -no beta blocker due to bradycardia -continue lisinopril, watch potassium (last 5.3) -given baseline elevated potassium, no room for spironolactone -per Dr. Clayborne Dana note, discussed entresto as an alternative to lisinopril. Would be careful given baseline elevated potassium. No changes today given change to amiodarone  History of mechanical AVR, on coumadin -continue coumadin and aspirin -SBE prophylaxis with dental procedures  COVID-19 Education: The signs and symptoms of COVID-19 were discussed with the patient and how to seek care for testing (follow up with PCP or arrange E-visit).  The importance of social distancing was discussed today.  Time:   Today, I have spent 29 minutes with the patient with telehealth technology discussing the above problems.  With preparation/review of prior studies and data, total time of visit is 41 minutes.  Complex medical decision making given risks/benefits of changing treatment with limited additional  options.  Medication Adjustments/Labs and Tests Ordered: Current medicines are reviewed at length with the patient today.  Concerns regarding medicines are outlined above.   Patient Instructions  Medication Instructions:  Hold amiodarone for 2 weeks  If you need a refill on your cardiac medications before your next appointment, please call your pharmacy.   Lab work: None  Testing/Procedures: Your physician has recommended that you have a pulmonary function test. Pulmonary Function Tests are a group of tests that measure how well air moves in and out of your lungs. [place] will be required to take a COVID-19 test prior to testing.  Follow-Up: At [facility], you and your health needs are our priority.  As part of our continuing mission to provide you  with exceptional heart care, we have created designated Provider Care Teams.  These Care Teams include your primary Cardiologist (physician) and Advanced Practice Providers (APPs -  Physician Assistants and Nurse Practitioners) who all work together to provide you with the care you need, when you need it. You will need a follow up appointment in 2 weeks.  Please call our office 2 months in advance to schedule this appointment.  You may see Buford Dresser, MD or one of the following Advanced Practice Providers on your designated Care Team:   Rosaria Ferries, PA-C  Jory Sims, DNP, ANP       Tests Ordered: Orders Placed This Encounter  Procedures   Pulmonary function test    Medication Changes: No orders of the defined types were placed in this encounter.   Disposition:  Follow up 2 weeks virtually  Signed, Buford Dresser, MD  07/31/2018 12:29 PM    [place]

## 2018-08-03 ENCOUNTER — Other Ambulatory Visit: Payer: Self-pay

## 2018-08-03 ENCOUNTER — Telehealth: Payer: Self-pay

## 2018-08-03 DIAGNOSIS — R062 Wheezing: Secondary | ICD-10-CM | POA: Diagnosis not present

## 2018-08-03 DIAGNOSIS — R0602 Shortness of breath: Secondary | ICD-10-CM | POA: Diagnosis not present

## 2018-08-03 NOTE — Telephone Encounter (Addendum)
Spoke with Pt's son and informed that pt is scheduled for PFT on Wednesday 6/10 at 36 am. Son voiced understanding that Pt is also scheduled for COVID test tomorrow 6/5 at 10:55 and pt would need to quarantine until PFT testing date.   Son wanted to make Dr. Harrell Gave aware they believe symptoms are more related to a sinus infection. Pt has a scheduled virtual visit with pcp to discuss treatment.

## 2018-08-04 ENCOUNTER — Other Ambulatory Visit (HOSPITAL_COMMUNITY)
Admission: RE | Admit: 2018-08-04 | Discharge: 2018-08-04 | Disposition: A | Discharge status: Home / Self Care | Payer: Medicare Other | Source: Ambulatory Visit | Attending: Cardiology | Admitting: Cardiology

## 2018-08-04 DIAGNOSIS — Z01812 Encounter for preprocedural laboratory examination: Secondary | ICD-10-CM | POA: Insufficient documentation

## 2018-08-04 DIAGNOSIS — Z1159 Encounter for screening for other viral diseases: Secondary | ICD-10-CM | POA: Diagnosis not present

## 2018-08-05 LAB — NOVEL CORONAVIRUS, NAA (HOSP ORDER, SEND-OUT TO REF LAB; TAT 18-24 HRS): SARS-CoV-2, NAA: NOT DETECTED

## 2018-08-07 ENCOUNTER — Telehealth: Payer: Self-pay | Admitting: Cardiology

## 2018-08-07 NOTE — Telephone Encounter (Signed)
Daughter in [place] called. They had a video visit Monday 06/01. At this visit, Amiodarone was put on hold for two weeks to treat a sinus infection and bronchitis. He was taking prednisone and doxycycline.   Daughter in [place] states that the source of the problem was not the Amiodarone. He has a lung function test on Wednesday.  Daughter in law wants to make sure that it is safe for the patient to start taking Amioderone again to reduce going into afib. She just wants to make sure Dr. Harrell Gave is on the same page.

## 2018-08-07 NOTE — Telephone Encounter (Signed)
Called daughter in law and LVM advising of message from [place]- left call back number if concerns.

## 2018-08-07 NOTE — Telephone Encounter (Signed)
Called patient daughter in law back- LVM advising that message was sent to Dr.Christopher and nurse to make sure okay to start back taking, and we would call back with recommendations.   Left call back number if any other questions.

## 2018-08-07 NOTE — Telephone Encounter (Signed)
New Message     Pt c/o medication issue:  1. Name of Medication: amiodarone   2. How are you currently taking this medication (dosage and times per day)? Restarted taking yesterday, he was off it for 6 days  100mg  daily   3. Are you having a reaction (difficulty breathing--STAT)? No  4. What is your medication issue? Pts breathing difficulties was due to an infection and not the medication  She is wondering if they still need to have the appt on the 16th

## 2018-08-07 NOTE — Telephone Encounter (Signed)
Should patient keep appointment on 16th since they restarting amiodarone. Thanks!

## 2018-08-07 NOTE — Telephone Encounter (Signed)
Thank you for the update. I am fine with restarting the amiodarone (same dose as prior) as he is very symptomatic with the atrial fibrillation. I would plan on having the lung function tests done as scheduled, as we use this to monitor any changes on amiodarone and can also be helpful if there are other causes affecting the lungs. I hope he is feeling better after the steroids and antibiotics. Thank you for the update.

## 2018-08-08 NOTE — Telephone Encounter (Signed)
Follow up   Patient's son is returning your call. Please call.  

## 2018-08-08 NOTE — Telephone Encounter (Signed)
Hi--I would like to just check in briefly in a virtual visit to make sure he is improving. If they are ok with that I would keep the appt. Otherwise we can schedule them for an in office (hopefully) visit in 3 mos. I am ok with whichever they prefer. Thanks!

## 2018-08-08 NOTE — Telephone Encounter (Signed)
Son and daughter state they would like to keep scheduled virtual appointment.

## 2018-08-08 NOTE — Telephone Encounter (Signed)
Called and LVM advising of message from MD.  Advised to call back to let us know what they would rather do.

## 2018-08-08 NOTE — Telephone Encounter (Signed)
Returned call to patient's son Sonia Side.He stated father will keep virtual appointment with Dr.Christopher 08/15/18 at 3:00 pm.Stated he would like Dr.Christopher to call his cell # [phone].Advised that is # listed in chart.

## 2018-08-09 ENCOUNTER — Other Ambulatory Visit: Payer: Self-pay

## 2018-08-09 ENCOUNTER — Ambulatory Visit (HOSPITAL_COMMUNITY)
Admission: RE | Admit: 2018-08-09 | Discharge: 2018-08-09 | Disposition: A | Discharge status: Home / Self Care | Payer: Medicare Other | Source: Ambulatory Visit | Attending: Cardiology | Admitting: Cardiology

## 2018-08-09 DIAGNOSIS — Z79899 Other long term (current) drug therapy: Secondary | ICD-10-CM | POA: Diagnosis not present

## 2018-08-09 DIAGNOSIS — R0602 Shortness of breath: Secondary | ICD-10-CM | POA: Insufficient documentation

## 2018-08-09 DIAGNOSIS — R062 Wheezing: Secondary | ICD-10-CM | POA: Diagnosis not present

## 2018-08-09 LAB — PULMONARY FUNCTION TEST
DL/VA % pred: 109 %
DL/VA: 4.26 ml/min/mmHg/L
DLCO unc % pred: 73 %
DLCO unc: 13.62 ml/min/mmHg
FEF 25-75 Post: 1.44 L/sec
FEF 25-75 Pre: 1.31 L/sec
FEF2575-%Change-Post: 9 %
FEF2575-%Pred-Post: 150 %
FEF2575-%Pred-Pre: 137 %
FEV1-%Change-Post: 1 %
FEV1-%Pred-Post: 89 %
FEV1-%Pred-Pre: 87 %
FEV1-Post: 1.5 L
FEV1-Pre: 1.48 L
FEV1FVC-%Change-Post: 0 %
FEV1FVC-%Pred-Pre: 110 %
FEV6-%Change-Post: 0 %
FEV6-%Pred-Post: 83 %
FEV6-%Pred-Pre: 82 %
FEV6-Post: 1.91 L
FEV6-Pre: 1.9 L
FEV6FVC-%Change-Post: 0 %
FEV6FVC-%Pred-Post: 109 %
FEV6FVC-%Pred-Pre: 110 %
FVC-%Change-Post: 1 %
FVC-%Pred-Post: 76 %
FVC-%Pred-Pre: 74 %
FVC-Post: 1.94 L
FVC-Pre: 1.91 L
Post FEV1/FVC ratio: 77 %
Post FEV6/FVC ratio: 98 %
Pre FEV1/FVC ratio: 77 %
Pre FEV6/FVC Ratio: 99 %
RV % pred: 87 %
RV: 2.13 L
TLC % pred: 71 %
TLC: 4.06 L

## 2018-08-09 MED ORDER — ALBUTEROL SULFATE (2.5 MG/3ML) 0.083% IN NEBU
2.5000 mg | INHALATION_SOLUTION | Freq: Once | RESPIRATORY_TRACT | Status: AC
  Administered 2018-08-09: 2.5 mg via RESPIRATORY_TRACT

## 2018-08-11 ENCOUNTER — Telehealth: Payer: Self-pay | Admitting: Cardiology

## 2018-08-11 ENCOUNTER — Ambulatory Visit (HOSPITAL_COMMUNITY)
Admission: EM | Admit: 2018-08-11 | Discharge: 2018-08-11 | Disposition: A | Discharge status: Home / Self Care | Payer: Medicare Other | Attending: Family Medicine | Admitting: Family Medicine

## 2018-08-11 ENCOUNTER — Other Ambulatory Visit: Payer: Self-pay

## 2018-08-11 ENCOUNTER — Ambulatory Visit (INDEPENDENT_AMBULATORY_CARE_PROVIDER_SITE_OTHER): Payer: Medicare Other

## 2018-08-11 ENCOUNTER — Encounter (HOSPITAL_COMMUNITY): Payer: Self-pay

## 2018-08-11 DIAGNOSIS — R05 Cough: Secondary | ICD-10-CM | POA: Diagnosis not present

## 2018-08-11 DIAGNOSIS — R062 Wheezing: Secondary | ICD-10-CM | POA: Diagnosis not present

## 2018-08-11 DIAGNOSIS — R0602 Shortness of breath: Secondary | ICD-10-CM

## 2018-08-11 DIAGNOSIS — J849 Interstitial pulmonary disease, unspecified: Secondary | ICD-10-CM | POA: Diagnosis not present

## 2018-08-11 NOTE — ED Provider Notes (Signed)
[facility]    CSN: [id number] Arrival date & time: 08/11/18  1254     History   Chief Complaint Chief Complaint  Patient presents with  . Shortness of Breath    HPI Tanner Ferguson is a 85 y.o. male.   HPI  Very pleasant 85 year old gentleman who is here today with his daughter-in-law who provides most of the medical history.  She is a Immunologist.  He has been progressively more short of breath over the last 5 weeks ago.  COVID testing negative.  He has a history of atrial fibrillation and heart failure.  He has not had any swelling in his ankles, chest pain, or indications that he is fluid overloaded.  He did carry a diagnosis of COPD.  He recently had a pulmonary function test which showed:  Conclusions: The reduced lung volumes, increased FEV1/FVC ratio and diffusion defect suggest an interstitial process such as fibrosis or interstitial inflammation. Anemia cannot be excluded as a potential cause of the diffusion defect without correcting the observed diffusing capacity for hemoglobin. Pulmonary Function Diagnosis: Mild Restriction -Interstitial Minimal Diffusion Defect  This interpretation has been electronically signed: Annamaria Boots, MD, [place] 08/10/2018 08:20:41 PM  Was treated with a course of steroids and doxycycline.  They made the wheezing better, but he still has shortness of breath with any exertion.  His family doctor recommended a chest x-ray.  Because there was some challenge getting the order to the right place, daughter-in-law decided to bring him here for evaluation.  Past Medical History:  Diagnosis Date  . CAD (coronary artery disease)    s/p NSTEMI 9/19. Cath in [place], [place] 10/19. Severe CAD not amenable to PCI. Not CABG candidate  . CHF (congestive heart failure) (HCC)    EF 40-45% by echo due to iCM. EF 36% by Myoview 9/19  . Essential tremor   . Hyperlipidemia    Intolerant of statins  . Hypertension   . PAF (paroxysmal atrial fibrillation) ([place])   .  Prostate cancer [facility])     Patient Active Problem List   Diagnosis Date Noted  . History of mechanical aortic valve replacement 03/09/2018  . Chronic systolic heart failure ([facility]) 03/09/2018  . Anticoagulant long-term use 03/09/2018  . Ischemic cardiomyopathy 03/09/2018  . Coronary artery disease   . Paroxysmal atrial fibrillation ([place]) 01/25/2018    History reviewed. No pertinent surgical history.     Home Medications    Prior to Admission medications   Medication Sig Start Date End Date Taking? Authorizing Provider  amiodarone (PACERONE) 200 MG tablet Take 0.5 tablets (100 mg total) by mouth daily. 03/13/18  Yes Buford Dresser, MD  lisinopril (ZESTRIL) 10 MG tablet Take 10 mg by mouth daily.    Yes [provider]  phenytoin (DILANTIN) 100 MG ER capsule Take 100 mg by mouth daily.    Yes [provider]  tamsulosin (FLOMAX) 0.4 MG CAPS capsule Take 0.4 mg by mouth daily.   Yes [provider]  warfarin (COUMADIN) 1 MG tablet Take 1 mg by mouth daily. With the 2.5 mg for a total of 3.5 mg daily   Yes [provider]  warfarin (COUMADIN) 2.5 MG tablet Take 2.5 mg by mouth daily. With 1 mg for a total of 3.5 mg   Yes [provider]  aspirin EC 81 MG tablet Take 81 mg by mouth daily.    [provider]  rosuvastatin (CRESTOR) 20 MG tablet Take 1 tablet (20 mg total) by mouth  daily. 03/09/18 07/31/18  Buford Dresser, MD    Family History Family History  Family history unknown: Yes    Social History Social History   Tobacco Use  . Smoking status: Never Smoker  . Smokeless tobacco: Former Systems developer    Types: Chew  Substance Use Topics  . Alcohol use: Not Currently  . Drug use: Not on file     Allergies   Penicillins   Review of Systems Review of Systems  Constitutional: Negative for chills and fever.  HENT: Negative for congestion, ear pain and sore throat.   Eyes: Negative for pain and visual disturbance.   Respiratory: Positive for cough and shortness of breath.   Cardiovascular: Negative for chest pain, palpitations and leg swelling.  Gastrointestinal: Negative for abdominal pain and vomiting.  Genitourinary: Negative for dysuria and hematuria.  Musculoskeletal: Negative for arthralgias and back pain.  Skin: Negative for color change and rash.  Neurological: Positive for tremors. Negative for seizures and syncope.  All other systems reviewed and are negative.    Physical Exam Triage Vital Signs ED Triage Vitals  Enc Vitals Group     BP 08/11/18 1318 (!) 120/54     Pulse Rate 08/11/18 1318 (!) 58     Resp 08/11/18 1318 20     Temp 08/11/18 1318 98.4 F (36.9 C)     Temp Source 08/11/18 1318 Oral     SpO2 08/11/18 1318 99 %     Weight --      Height --      Head Circumference --      Peak Flow --      Pain Score 08/11/18 1316 0     Pain Loc --      Pain Edu? --      Excl. in [employer]? --    No data found.  Updated Vital Signs BP (!) 120/54 (BP Location: Left Arm)   Pulse (!) 58   Temp 98.4 F (36.9 C) (Oral)   Resp 20   SpO2 99%      Physical Exam Constitutional:      General: He is not in acute distress.    Appearance: He is well-developed.     Comments: Benign resting tremor noted  HENT:     Head: Normocephalic and atraumatic.  Eyes:     Conjunctiva/sclera: Conjunctivae normal.     Pupils: Pupils are equal, round, and reactive to light.  Neck:     Musculoskeletal: Normal range of motion.  Cardiovascular:     Rate and Rhythm: Normal rate and regular rhythm.     Heart sounds: Murmur present.     Comments: Distinct click, artificial valve Pulmonary:     Effort: Pulmonary effort is normal. No respiratory distress.     Breath sounds: Examination of the right-lower field reveals rales. Rales present. No wheezing.  Abdominal:     General: There is no distension.     Palpations: Abdomen is soft.  Musculoskeletal: Normal range of motion.  Skin:    General: Skin is  warm and dry.  Neurological:     Mental Status: He is alert.      UC Treatments / Results  Labs (all labs ordered are listed, but only abnormal results are displayed) Labs Reviewed - No data to display  EKG None  Radiology Dg Chest 2 View  Result Date: 08/11/2018 CLINICAL DATA:  Pt has noticed coughing and wheezing x >1 week. Pt states he is not a smoker, Pt states he  doesn't take any medication for hypertension. He is not a diabetic. EXAM: CHEST - 2 VIEW COMPARISON:  07/05/2018 FINDINGS: There stable changes from prior CABG surgery. Cardiac silhouette is borderline enlarged. No mediastinal or hilar masses. No evidence of adenopathy. No pleural effusion or pneumothorax. Skeletal structures are intact. IMPRESSION: No active cardiopulmonary disease. Electronically Signed   By: Lajean Manes M.D.   On: 08/11/2018 13:51    Procedures Procedures (including critical care time)  Medications Ordered in UC Medications - No data to display  Initial Impression / Assessment and Plan / UC Course  I have reviewed the triage vital signs and the nursing notes.  Pertinent labs & imaging results that were available during my care of the patient were reviewed by me and considered in my medical decision making (see chart for details).     The patient is being closely followed by his primary care doctor and his cardiologist for this progressive shortness of breath.  He is stable.  I have nothing to offer from an urgent care standpoint.  I did encourage him/daughter-in-law to call each physician this afternoon for advice on management.  Possibly he may stop amiodarone.  Should still continue walking to tolerance. Final Clinical Impressions(s) / UC Diagnoses   Final diagnoses:  SOB (shortness of breath)     Discharge Instructions     Call your physician for instructions It was a pleasure meeting you    ED Prescriptions    None     Controlled Substance Prescriptions [place] Controlled  Substance Registry consulted? Not Applicable   Raylene Everts, MD 08/11/18 1929

## 2018-08-11 NOTE — Discharge Instructions (Addendum)
Call your physician for instructions It was a pleasure meeting you

## 2018-08-11 NOTE — Telephone Encounter (Signed)
Daughter called back to report results of chest x-ray that was done after calling the office this morning.

## 2018-08-11 NOTE — Telephone Encounter (Signed)
Will forward to Dr Christopher for review  

## 2018-08-11 NOTE — Telephone Encounter (Signed)
Returned call to daughter. Patient has been having respiratory issues for 1+ months. He had a CXR in May and recent PFT. PCP advised that patient contact us about stopping amiodarone based on PFTs however I explained to daughter that this was not the recommendation per report and I read to her verbatim MD interpretation. She reports patient has crackles & wheezing bilaterally, R worse than L, and PCP ordered another CXR to be completed today. He has been on an antibiotic as well. Advised that patient continue medications as prescribed since Dr. Harrell Gave did not make changes based on PFT. Asked that she call back with CXR so MD can be updated

## 2018-08-11 NOTE — Telephone Encounter (Signed)
Thank you. We do not have a prior PFT for comparison, but there was only evidence of mild inflammation/possible fibrosis, which is nonspecific and can be related to infection or other causes of inflammation as well. We have had extensive discussion about risk/benefit of stopping amiodarone. My understanding was that he actually improved for a time (we had discussed a trial of stopping amiodarone, but as he was doing better family wished to continue); so if this is a worsening I agree with PCP eval and chest x ray.

## 2018-08-11 NOTE — ED Triage Notes (Signed)
Patient presents to Urgent Care with complaints of chest congestion and shortness of breath since about 5 weeks ago. Patient reports he recently tested negative for COVID and had a pulmonary function test and it showed some possible infection and mild fibrosis. Pt finished a steroid taper the day before yesterday and finished doxycycline yesterday. Pt was sent here today for chest x-ray to assess returned wheezing. Pt given albuterol inhaler about 90 mins ago by his daughter-in-law, who is here with the patient today.

## 2018-08-11 NOTE — Telephone Encounter (Signed)
  Daughter in law is calling to report that patient had a pulmonary test done and Dr Lorenda Hatchet would like for Dr Harrell Gave to look at the results and see if he needs to stop the amiodarone. Patient will be getting a chest xray also to rule out pneumonia probably today.

## 2018-08-15 ENCOUNTER — Encounter: Payer: Self-pay | Admitting: Cardiology

## 2018-08-15 ENCOUNTER — Telehealth (INDEPENDENT_AMBULATORY_CARE_PROVIDER_SITE_OTHER): Payer: Medicare Other | Admitting: Cardiology

## 2018-08-15 VITALS — BP 144/78 | HR 56 | Ht 61.0 in | Wt 155.0 lb

## 2018-08-15 DIAGNOSIS — Z712 Person consulting for explanation of examination or test findings: Secondary | ICD-10-CM | POA: Diagnosis not present

## 2018-08-15 DIAGNOSIS — I480 Paroxysmal atrial fibrillation: Secondary | ICD-10-CM | POA: Diagnosis not present

## 2018-08-15 DIAGNOSIS — I2584 Coronary atherosclerosis due to calcified coronary lesion: Secondary | ICD-10-CM

## 2018-08-15 DIAGNOSIS — I5022 Chronic systolic (congestive) heart failure: Secondary | ICD-10-CM

## 2018-08-15 DIAGNOSIS — I2510 Atherosclerotic heart disease of native coronary artery without angina pectoris: Secondary | ICD-10-CM | POA: Diagnosis not present

## 2018-08-15 DIAGNOSIS — Z7189 Other specified counseling: Secondary | ICD-10-CM | POA: Diagnosis not present

## 2018-08-15 DIAGNOSIS — R0602 Shortness of breath: Secondary | ICD-10-CM | POA: Diagnosis not present

## 2018-08-15 NOTE — Telephone Encounter (Signed)
Left message to call back  

## 2018-08-15 NOTE — Patient Instructions (Signed)
Medication Instructions:  Your Physician recommend you continue on your current medication as directed.    If you need a refill on your cardiac medications before your next appointment, please call your pharmacy.   Lab work: None  Testing/Procedures: None  Follow-Up: At [facility], you and your health needs are our priority.  As part of our continuing mission to provide you with exceptional heart care, we have created designated Provider Care Teams.  These Care Teams include your primary Cardiologist (physician) and Advanced Practice Providers (APPs -  Physician Assistants and Nurse Practitioners) who all work together to provide you with the care you need, when you need it. You will need a follow up appointment in 4-6 weeks.  Please call our office 2 months in advance to schedule this appointment.  You may see Buford Dresser, MD or one of the following Advanced Practice Providers on your designated Care Team:   Rosaria Ferries, PA-C . Jory Sims, DNP, ANP

## 2018-08-15 NOTE — Progress Notes (Signed)
Virtual Visit via Video Note   This visit type was conducted due to national recommendations for restrictions regarding the COVID-19 Pandemic (e.g. social distancing) in an effort to limit this patient's exposure and mitigate transmission in our community.  Due to his co-morbid illnesses, this patient is at least at moderate risk for complications without adequate follow up.  This format is felt to be most appropriate for this patient at this time.  All issues noted in this document were discussed and addressed.  A limited physical exam was performed with this format.  Please refer to the patient's chart for his consent to telehealth for [facility].   Date:  08/15/2018   ID:  Tanner Ferguson, DOB 22-Jun-1927, MRN [id number]  Patient Location: Home Provider Location: Home  PCP:  Wenda Low, MD  Cardiologist:  Buford Dresser, MD  Electrophysiologist:  None   Evaluation Performed:  Follow-Up Visit  Chief Complaint:  Shortness of breath  History of Present Illness:    Tanner Ferguson is a 85 y.o. male with hx of aortic stenosis s/p mechanical AVR (1995), CAD with MI 11/2017, ischemic cardiomyopathy, paroxysmal atrial fibrillation, obstructive sleep apnea, hypertension, hyperlipidemia, reported COPD and asbestos exposure. We have been following closely for wheezing/shortness of breath.  The patient has tested negative for COVID during his recent symptoms.  Video visit with patient, daughter in law, and son today. Reviewed pattern of recent shortness of breath. Began >1 mos ago with audible wheezing. Saw Dr. Lysle Rubens, had chest x-ray 07/05/18. We had a video visit on 6/1 to discuss his symptoms. He has a complicated history, and not all records are available to me. He has a reported history of asbestos exposure.On review of care everywhere, I found the following:  PFT: (10/16/2014) Spirometry showed an FVC of 91% of predicted, FEV1 of 102% of  predicted, and an FVC/FEV1 ratio of  75%  No significanr bronchodilator responsiveness was seen  Total lung capacity is Low at 68% of predicted  Diffusion capacity is normal at 102% of predicted.   IMPRESSION:  There is no obstructive . There is mild restrictive lung defect  is seen. There was insignificant response to bronchodilators.  Lung volumes revealed mild restriction . Diffusion capacity is  normal. Clinical correlation is reccommended   CT chest on 07/30/2014: There is moderate cardiomegaly without pericardial effusion. Extensive multivessel coronary artery calcification is identified. The tracheobronchial tree is widely patent. The esophagus is minimally distended. Evaluation of the pulmonary parenchyma is negative for focal airspace disease or consolidation. No spiculated pulmonary nodule or mass is detected. There is no pleural effusion.  There are a few calcified pleural plaques at the diaphragmatic surface of the right lung base. Similarly a very few calcified pleural plaques in the left lung base at the interface with the diaphragmatic surface. Further attention to the lung bases  demonstrates subtle peripheral subpleural lines. There is no overt bronchiectasis or honeycombing.  Sleep Study - This unattended sleep study is positive for clinically significant sleep disordered breathing. Obstructive and central sleep apnea. (12/13/2012)  At that visit, we discussed the very difficult decision on whether to stop amiodarone or continue. He has had bradycardia on beta blockers, and he cannot be on CCB due to his cardiomyopathy. Also, due to his CAD/cardiomyopathy, the only options for rhythm control are amiodarone and dofetilide, and they are not interested in dofetilide (see prior discussion). He is very symptomatic in atrial fibrillation. We discussed risk/benefit, planned to stop amiodarone to see if his  symptoms improved. Also planned repeat PFTs to determine if there had been any change. These were done on  08/09/18 (with a negative COVID test prior): Patient gave best possible effort.. The results of this test meets ATS standards, although the DLCO received error code IVC < 90%. HHN given with 2.5mg  of Albuterol Hgb default value of 14.6 used. The FVC is reduced, but the FEV1/FVC ratio is increased. Patient effort was erratic, but the normal FEV1 indicates no significant obstruction. The TLC and SVC are reduced, but the FRC is normal. Following administration of bronchodilators, there is no significant response. The reduced diffusing capacity indicates a minimal loss of functional alveolar capillary surface. However, the diffusing capacity was not corrected for the patient's hemoglobin.  Conclusions: The reduced lung volumes, increased FEV1/FVC ratio and diffusion defect suggest an interstitial process such as fibrosis or interstitial inflammation. Anemia cannot be excluded as a potential cause of the diffusion defect without  correcting the observed diffusing capacity for hemoglobin.   Pulmonary Function Diagnosis:  Mild Restriction -Interstitial  Minimal Diffusion Defect In the interim, there was a call (documented 6/8) that patient's breathing was due to infection and not the amiodarone, and they wished to restart the amiodarone. Per family today, he was treated with steroids and doxycycline between our visits. This reportedly improved the wheezing, though he remained short of breath with exertion. They presented to urgent care on 6/12 for chest x-ray and exam. Exam noted rales in RLL. No wheezing. CXR with no acute process, similar to prior (on my review, both appear to have some right hilar fullness, but no effusions, etc).   Today we reviewed the testing to date. He has been started on symbicort with some improvement. No chest pain, LE edema. They are waiting to hear on the referral to pulmonary.  I reviewed my understanding of the PFTs. The restriction was mild, and the diffusion defect was labeled  as minimal. I only have the written report of prior PFTs from 2016 to compare, Per that report, there was also a mild restrictive defect and reduced total lung capacity. However, DLCO noted as normal on that study.  I told them that I could not tell for sure if this is due to amiodarone toxicity or another process. He did improve with both oral and inhaled steroids. I agree that pulmonary evaluation would be very helpful for further interpretation, and with his history of asbestos exposure/prior pleural plaques seen on CT, a high res CT may be helpful for further differentiation.  We again discussed stopping the amiodarone. I noted that it may be necessary if we cannot find another etiology for his breathing. I noted that our only other rhythm control option is dofetilide, and that he had significant bradycardia with beta blockers. They remember how terrible he felt in afib and want to avoid at all costs.   Past Medical History:  Diagnosis Date  . CAD (coronary artery disease)    s/p NSTEMI 9/19. Cath in [place], [place] 10/19. Severe CAD not amenable to PCI. Not CABG candidate  . CHF (congestive heart failure) (HCC)    EF 40-45% by echo due to iCM. EF 36% by Myoview 9/19  . Essential tremor   . Hyperlipidemia    Intolerant of statins  . Hypertension   . PAF (paroxysmal atrial fibrillation) ([place])   . Prostate cancer ([place])    No past surgical history on file.   Current Meds  Medication Sig  . amiodarone (PACERONE) 200 MG tablet Take  0.5 tablets (100 mg total) by mouth daily.  Marland Kitchen aspirin EC 81 MG tablet Take 81 mg by mouth daily.  Marland Kitchen lisinopril (ZESTRIL) 10 MG tablet Take 10 mg by mouth daily.   . phenytoin (DILANTIN) 100 MG ER capsule Take 100 mg by mouth daily.   . rosuvastatin (CRESTOR) 20 MG tablet Take 1 tablet (20 mg total) by mouth daily.  . tamsulosin (FLOMAX) 0.4 MG CAPS capsule Take 0.4 mg by mouth daily.  Marland Kitchen warfarin (COUMADIN) 1 MG tablet Take 1 mg by mouth daily. With the 2.5 mg for a  total of 3.5 mg daily  . warfarin (COUMADIN) 2.5 MG tablet Take 2.5 mg by mouth daily. With 1 mg for a total of 3.5 mg     Allergies:   Penicillins   Social History   Tobacco Use  . Smoking status: Never Smoker  . Smokeless tobacco: Former Systems developer    Types: Chew  Substance Use Topics  . Alcohol use: Not Currently  . Drug use: Not on file     Family Hx: The patient's Family history is unknown by patient.  ROS:   Please see the history of present illness.    Constitutional: Negative for chills, fever, night sweats, unintentional weight loss  HENT: Negative for ear pain and hearing loss.   Eyes: Negative for loss of vision and eye pain.  Respiratory: POSTIVE for cough, sputum, wheezing.   Cardiovascular: See HPI. Gastrointestinal: Negative for abdominal pain, melena, and hematochezia.  Genitourinary: Negative for dysuria and hematuria.  Musculoskeletal: Negative for falls and myalgias.  Skin: Negative for itching and rash.  Neurological: Negative for focal weakness, focal sensory changes and loss of consciousness.  Endo/Heme/Allergies: Does bruise/bleed easily.  All other systems reviewed and are negative.   Prior CV studies:   The following studies were reviewed today: Prior PFTs, CT scan Most recent PFTs  Labs/Other Tests and Data Reviewed:    EKG:  An ECG dated 03/09/18 was personally reviewed today and demonstrated:  sinus brady, 1st degree AV block, IVCD, nonspecific ST changes  Recent Labs: 01/25/2018: ALT 12; B Natriuretic Peptide 328.3; BUN 16; Creatinine, Ser 1.47; Hemoglobin 12.7; Platelets 128; Potassium 5.3; Sodium 131   Recent Lipid Panel No results found for: CHOL, TRIG, HDL, CHOLHDL, LDLCALC, LDLDIRECT  Wt Readings from Last 3 Encounters:  08/15/18 155 lb (70.3 kg)  07/31/18 154 lb (69.9 kg)  06/07/18 154 lb 9.6 oz (70.1 kg)     Objective:    Vital Signs:  BP (!) 144/78   Pulse (!) 56   Ht 5\' 1"  (1.549 m)   Wt 155 lb (70.3 kg)   BMI 29.29 kg/m     VITAL SIGNS:  reviewed GEN:  no acute distress EYES:  sclerae anicteric, EOMI - Extraocular Movements Intact RESPIRATORY:  normal respiratory effort, symmetric expansion CARDIOVASCULAR:  no JVD visualized on phone SKIN:  no rash, lesions or ulcers. MUSCULOSKELETAL:  no obvious deformities. NEURO:  resting tremor PSYCH:  normal affect  ASSESSMENT & PLAN:    Shortness of breath, wheezing: See prior note for full differential -no current HF symptoms otherwise reported -CXR without pneumonia or fluid -improved with steroids and doxycycline somewhat -PFTs as noted, mild restriction, minimal diffusion abnormality noted -with history of asbestos exposure/pleural plaques as well as amiodarone use, could be chemical toxicity/fibrosis.  -referred to pulm, awaiting scheduling of that appt -may benefit from high res CT for further evaluation, will defer to pulm. -we have extensively discussed risk/benefit of stopping  amiodarone. He is very symptomatic in afib, and we have limited other agents for control. However, cannot exclude amio toxicity. He stopped it briefly (about 6 days), but with long half life this was not fully out of his system in that time. They understand the risks/benefits and would prefer to continue until he can be seen by pulmonology -will need TFTs, LFTs at next in office visit if not done sooner  Paroxysmal atrial fibrillation -very symptomatic when in afib RVR, required 2 cardioversions in the fall -coumadin monitored by Dr. Glenna Durand office.  -CHA2DS2/VAS Stroke Risk Points=4, but irrelevant as he needs to be on coumadin for his mechanical valve. -continue amiodarone for now, as above  Discussed minimally or no change today:  CAD s/p NSTEMI, with diffuse severe disease seen on cath -continue aspirin -continuerosuvastatin -resting sinus bradycardia. No room for beta blocker -no chest pain, but prior symptoms was shortness of breath. Wheezing is not consistent with  his prior symptoms, but need to continue to monitor closely. -medical management  Chronic systolic heart failure, 2/2 ischemic cardiomyopathy. EF 40-45% -euvolemicper report -no beta blocker due to bradycardia -continue lisinopril, watch potassium (last 5.3) -given baseline elevated potassium, no room for spironolactone -per Dr. Clayborne Dana note, discussed entresto as an alternative to lisinopril. Would be careful given baseline elevated potassium.   History of mechanical AVR, on coumadin -continue coumadin and aspirin -SBE prophylaxis with dental procedures  COVID-19 Education: The signs and symptoms of COVID-19 were discussed with the patient and how to seek care for testing (follow up with PCP or arrange E-visit).  The importance of social distancing was discussed today.  Time:   Today, I have spent 26 minutes with the patient with telehealth technology discussing the above problems.  With review of prior studies in Care Everywhere, other provider's notes, and recent PFT results, total time of this visit is 42 minutes.  Patient Instructions  Medication Instructions:  Your Physician recommend you continue on your current medication as directed.    If you need a refill on your cardiac medications before your next appointment, please call your pharmacy.   Lab work: None  Testing/Procedures: None  Follow-Up: At [facility], you and your health needs are our priority.  As part of our continuing mission to provide you with exceptional heart care, we have created designated Provider Care Teams.  These Care Teams include your primary Cardiologist (physician) and Advanced Practice Providers (APPs -  Physician Assistants and Nurse Practitioners) who all work together to provide you with the care you need, when you need it. You will need a follow up appointment in 4-6 weeks.  Please call our office 2 months in advance to schedule this appointment.  You may see Buford Dresser,  MD or one of the following Advanced Practice Providers on your designated Care Team:   Rosaria Ferries, PA-C . Jory Sims, DNP, ANP     Medication Adjustments/Labs and Tests Ordered: Current medicines are reviewed at length with the patient today.  Concerns regarding medicines are outlined above.   Tests Ordered: No orders of the defined types were placed in this encounter.  Medication Changes: No orders of the defined types were placed in this encounter.  Follow Up:  4-6 weeks, ideally in person  Signed, Buford Dresser, MD  08/15/2018 8:39 PM    [place]

## 2018-08-21 DIAGNOSIS — I4821 Permanent atrial fibrillation: Secondary | ICD-10-CM | POA: Diagnosis not present

## 2018-08-21 DIAGNOSIS — Z7901 Long term (current) use of anticoagulants: Secondary | ICD-10-CM | POA: Diagnosis not present

## 2018-08-24 ENCOUNTER — Telehealth: Payer: Self-pay | Admitting: Cardiology

## 2018-08-24 ENCOUNTER — Telehealth: Payer: Self-pay | Admitting: Internal Medicine

## 2018-08-24 NOTE — Telephone Encounter (Signed)
Received a call from [facility] with [facility]. Per Rip Harbour, Dr. Harrell Gave had placed a referral for pt to be seen for a consult at our office and since they had not heard anything yet in regards to pt being scheduled for the consult, Rip Harbour stated that Dr. Harrell Gave had her follow up on this.  Pt had PFT performed 6/10., Rip Harbour stated that it seemed like a referral was first placed by pt's PCP Dr. Lysle Rubens but then another referral had been placed by Dr. Judeth Cornfield office. I stated to [facility] that I was going to send this over to [facility] for her to follow up on and see when we can get pt scheduled for the appt and Rip Harbour verbalized understanding.  Patrice, please advise on this for pt. Thank you!

## 2018-08-24 NOTE — Telephone Encounter (Signed)
Spoke with [employer] and patient was referred by PCP to pulmonologist. The office has not contacted patient yet and when directly to follow up was told it would be up to 10 days before contacted for appointment, today 10 th day. Called [facility] and was told message would be sent to new patient schedulers. Floria Raveling if she does not hear from them tomorrow ok to call back Monday to see if referral to [facility] ok with Dr Harrell Gave

## 2018-08-24 NOTE — Telephone Encounter (Signed)
New Message              Patient daughter in law "Tanner Ferguson" is calling to let Dr Harrell Gave know that they have not heard anything about the pulmonary consult, she states that she was told to call if they have not heard any thing, and at this time they have not heard anything.

## 2018-08-25 NOTE — Telephone Encounter (Signed)
Only received referral from Dr. Deforest Hoyles in [place]. Called and left message on pt vm to call back to schedule pulmonary consult - pt needs to be seen by Dr. Vaughan Browner for interstitial fibrosis. -pr

## 2018-08-28 NOTE — Telephone Encounter (Signed)
Per chart review, pt has an appointment scheduled at [facility] on 7/7.

## 2018-09-05 ENCOUNTER — Other Ambulatory Visit: Payer: Self-pay

## 2018-09-05 ENCOUNTER — Ambulatory Visit (INDEPENDENT_AMBULATORY_CARE_PROVIDER_SITE_OTHER): Payer: Medicare Other | Admitting: Pulmonary Disease

## 2018-09-05 ENCOUNTER — Encounter: Payer: Self-pay | Admitting: Pulmonary Disease

## 2018-09-05 VITALS — BP 120/68 | HR 60 | Temp 98.1°F | Ht 62.0 in | Wt 157.2 lb

## 2018-09-05 DIAGNOSIS — G4733 Obstructive sleep apnea (adult) (pediatric): Secondary | ICD-10-CM

## 2018-09-05 DIAGNOSIS — R0600 Dyspnea, unspecified: Secondary | ICD-10-CM

## 2018-09-05 LAB — CBC WITH DIFFERENTIAL/PLATELET
Basophils Absolute: 0 10*3/uL (ref 0.0–0.1)
Basophils Relative: 0.5 % (ref 0.0–3.0)
Eosinophils Absolute: 0.2 10*3/uL (ref 0.0–0.7)
Eosinophils Relative: 5.2 % — ABNORMAL HIGH (ref 0.0–5.0)
HCT: 35.3 % — ABNORMAL LOW (ref 39.0–52.0)
Hemoglobin: 11.9 g/dL — ABNORMAL LOW (ref 13.0–17.0)
Lymphocytes Relative: 16.4 % (ref 12.0–46.0)
Lymphs Abs: 0.5 10*3/uL — ABNORMAL LOW (ref 0.7–4.0)
MCHC: 33.8 g/dL (ref 30.0–36.0)
MCV: 94.2 fl (ref 78.0–100.0)
Monocytes Absolute: 0.4 10*3/uL (ref 0.1–1.0)
Monocytes Relative: 12.2 % — ABNORMAL HIGH (ref 3.0–12.0)
Neutro Abs: 2.2 10*3/uL (ref 1.4–7.7)
Neutrophils Relative %: 65.7 % (ref 43.0–77.0)
Platelets: 135 10*3/uL — ABNORMAL LOW (ref 150.0–400.0)
RBC: 3.74 Mil/uL — ABNORMAL LOW (ref 4.22–5.81)
RDW: 14 % (ref 11.5–15.5)
WBC: 3.3 10*3/uL — ABNORMAL LOW (ref 4.0–10.5)

## 2018-09-05 NOTE — Patient Instructions (Signed)
We will get some labs today including CBC with differential, blood allergy profile to evaluate your allergies. Continue using the Symbicort.  Will change the prescription to 2 puffs twice daily  We will schedule you for a high-resolution CT Follow-up in 2 to 4 weeks.

## 2018-09-05 NOTE — Addendum Note (Signed)
Addended by: Suzzanne Cloud E on: 09/05/2018 10:19 AM   Modules accepted: Orders

## 2018-09-05 NOTE — Progress Notes (Addendum)
Tanner Ferguson    [id number]    [birth year]  Primary Care Physician:Husain, Denton Ar, MD  Referring Physician: Wenda Low, MD [place] [employer] [place] [address],  [place] [id number]  Chief complaint: Consult for pulmonary fibrosis  HPI: 85 year old with history of atrial fibrillation, coronary artery disease, CHF, asbestos exposure in the past, aortic valve replacement, prostate cancer.  Complains of dyspnea with wheezing for the past several months.  He had been initially started on Pulmicort inhaler and then switched to Symbicort.  Treated with doxycycline and prednisone in June 2020 Reports improvement in symptoms.  States that breathing is much improved.  Denies any cough, sputum production.  Has chronic dyspnea on exertion PFTs show mild restriction and has been referred here for evaluation of interstitial lung disease.   Previously evaluated with [place] in 2016 with CT scan and pulmonary function test which showed pleural plaques, no pulmonary fibrosis and mild restriction.  Has history of MI in September 2019.  He started on amiodarone in October 2019 for atrial fibrillation.  He also has a mechanical aortic valve and is on chronic Coumadin anticoagulation.  Pets: No pets Occupation: Used to work in plumbing, heating and cooling.  Retired in 2014  Exposures, ILD questionnaire 09/05/2018: Exposure to asbestos.  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 No mold, hot tub, Jacuzzi  Smoking history: Never smoker Travel history: Lived in [place] for most of his life. Relevant family history: No significant family history of lung disease  Outpatient Encounter Medications as of 09/05/2018  Medication Sig  . albuterol (VENTOLIN HFA) 108 (90 Base) MCG/ACT inhaler   . amiodarone (PACERONE) 200 MG tablet Take 0.5 tablets (100 mg total) by mouth daily.  Marland Kitchen aspirin EC 81 MG tablet Take 81 mg by mouth daily.  Marland Kitchen  lisinopril (ZESTRIL) 10 MG tablet Take 10 mg by mouth daily.   . phenytoin (DILANTIN) 100 MG ER capsule Take 100 mg by mouth daily.   . rosuvastatin (CRESTOR) 20 MG tablet Take 1 tablet (20 mg total) by mouth daily.  . SYMBICORT 80-4.5 MCG/ACT inhaler Inhale 2 puffs into the lungs daily.   . tamsulosin (FLOMAX) 0.4 MG CAPS capsule Take 0.4 mg by mouth daily.  Marland Kitchen warfarin (COUMADIN) 1 MG tablet Take 1 mg by mouth daily. With the 2.5 mg for a total of 3.5 mg daily  . warfarin (COUMADIN) 2.5 MG tablet Take 2.5 mg by mouth daily. With 1 mg for a total of 3.5 mg   No facility-administered encounter medications on file as of 09/05/2018.     Allergies as of 09/05/2018 - Review Complete 09/05/2018  Allergen Reaction Noted  . Penicillins Rash 01/14/2018    Past Medical History:  Diagnosis Date  . CAD (coronary artery disease)    s/p NSTEMI 9/19. Cath in [place], [place] 10/19. Severe CAD not amenable to PCI. Not CABG candidate  . CHF (congestive heart failure) (HCC)    EF 40-45% by echo due to iCM. EF 36% by Myoview 9/19  . Essential tremor   . Hyperlipidemia    Intolerant of statins  . Hypertension   . PAF (paroxysmal atrial fibrillation) ([place])   . Prostate cancer [facility])     History reviewed. No pertinent surgical history.  Family History  Family history unknown: Yes    Social History   Socioeconomic History  . Marital status: Widowed    Spouse name: Not on file  .  Number of children: Not on file  . Years of education: Not on file  . Highest education level: Not on file  Occupational History  . Not on file  Social Needs  . Financial resource strain: Not on file  . Food insecurity    Worry: Not on file    Inability: Not on file  . Transportation needs    Medical: Not on file    Non-medical: Not on file  Tobacco Use  . Smoking status: Never Smoker  . Smokeless tobacco: Former Systems developer    Types: Chew  Substance and Sexual Activity  . Alcohol use: Not Currently  . Drug use: Not on  file  . Sexual activity: Not on file  Lifestyle  . Physical activity    Days per week: Not on file    Minutes per session: Not on file  . Stress: Not on file  Relationships  . Social Herbalist on phone: Not on file    Gets together: Not on file    Attends religious service: Not on file    Active member of club or organization: Not on file    Attends meetings of clubs or organizations: Not on file    Relationship status: Not on file  . Intimate partner violence    Fear of current or ex partner: Not on file    Emotionally abused: Not on file    Physically abused: Not on file    Forced sexual activity: Not on file  Other Topics Concern  . Not on file  Social History Narrative  . Not on file    Review of systems: Review of Systems  Constitutional: Negative for fever and chills.  HENT: Negative.   Eyes: Negative for blurred vision.  Respiratory: as per HPI  Cardiovascular: Negative for chest pain and palpitations.  Gastrointestinal: Negative for vomiting, diarrhea, blood per rectum. Genitourinary: Negative for dysuria, urgency, frequency and hematuria.  Musculoskeletal: Negative for myalgias, back pain and joint pain.  Skin: Negative for itching and rash.  Neurological: Negative for dizziness, tremors, focal weakness, seizures and loss of consciousness.  Endo/Heme/Allergies: Negative for environmental allergies.  Psychiatric/Behavioral: Negative for depression, suicidal ideas and hallucinations.  All other systems reviewed and are negative.  Physical Exam: Blood pressure 120/68, pulse 60, temperature 98.1 F (36.7 C), temperature source Oral, height 5\' 2"  (1.575 m), weight 157 lb 3.2 oz (71.3 kg), SpO2 98 %. Gen:      No acute distress HEENT:  EOMI, sclera anicteric Neck:     No masses; no thyromegaly Lungs:    Basal crackles. CV:         Regular rate and rhythm; no murmurs Abd:      + bowel sounds; soft, non-tender; no palpable masses, no distension Ext:     No edema; adequate peripheral perfusion Skin:      Warm and dry; no rash Neuro: alert and oriented x 3 Psych: normal mood and affect  Data Reviewed: Imaging: CT chest 07/30/2014-calcified pleural plaques with no clear evidence of fibrosis, cardiomegaly.  Chest x-ray 12/20-no acute cardiopulmonary abnormality.  I have reviewed the images personally.  PFTs: 01/11/2015 ([place]) -FVC 91%, FEV 102%, F/F 75%. Lung volumes show mild restriction.  Diffusion capacity is normal.  08/09/2018 FVC 1.94 (76%), FEV1 1.50 [89%), F/F 77, TLC 4.06 [71%], DLCO 13.62 [72%], DLCO/VA 109% Minimal restriction and minimal diffusion defect which corrects alveolar volume.  Sleep [facility])  09/13/2013- positive for clinically significant sleep disordered breathing.  Cardiac Echo 11/04/2017-EF 40-45%, concentric LVH, mild chronic aortic valve with trivial AR.  Assessment:  Evaluation for dyspnea, interstitial lung disease Patient has history of asbestos exposure and CT from 2016 noted with calcified pleural plaques with no fibrosis.  There is also concern for amiodarone induced interstitial lung disease  We will schedule a high-resolution CT for evaluation Continue amiodarone for now pending review of CT scan.  Per cardiology discussion he does not have very good alternatives in terms of antiarrhythmics  Wheezing, allergies Suspect that his acute episode of respiratory issues may have been allergies, reactive airway disease Continue Symbicort.  Have asked him to take 2 puffs twice daily for maximum benefit Check CBC differential, IgE and blood allergy profile for further evaluation.  Sleep apnea Records show a sleep study on 09/13/2013 which was positive for OSA.  He is not on treatment now We will order home sleep study to evaluate  Plan/Recommendations: - High-resolution CT - CBC, IgE, blood allergy profile - Increase Symbicort to 2 puffs twice daily - Home sleep study  Marshell Garfinkel MD  [facility] 09/05/2018, 9:04 AM  CC: Wenda Low, MD

## 2018-09-06 DIAGNOSIS — F0390 Unspecified dementia without behavioral disturbance: Secondary | ICD-10-CM | POA: Diagnosis not present

## 2018-09-06 DIAGNOSIS — Z0000 Encounter for general adult medical examination without abnormal findings: Secondary | ICD-10-CM | POA: Diagnosis not present

## 2018-09-06 DIAGNOSIS — I700 Atherosclerosis of aorta: Secondary | ICD-10-CM | POA: Diagnosis not present

## 2018-09-06 DIAGNOSIS — C61 Malignant neoplasm of prostate: Secondary | ICD-10-CM | POA: Diagnosis not present

## 2018-09-06 DIAGNOSIS — I509 Heart failure, unspecified: Secondary | ICD-10-CM | POA: Diagnosis not present

## 2018-09-06 DIAGNOSIS — I2510 Atherosclerotic heart disease of native coronary artery without angina pectoris: Secondary | ICD-10-CM | POA: Diagnosis not present

## 2018-09-06 DIAGNOSIS — J849 Interstitial pulmonary disease, unspecified: Secondary | ICD-10-CM | POA: Diagnosis not present

## 2018-09-06 DIAGNOSIS — I4891 Unspecified atrial fibrillation: Secondary | ICD-10-CM | POA: Diagnosis not present

## 2018-09-06 DIAGNOSIS — I10 Essential (primary) hypertension: Secondary | ICD-10-CM | POA: Diagnosis not present

## 2018-09-06 DIAGNOSIS — R251 Tremor, unspecified: Secondary | ICD-10-CM | POA: Diagnosis not present

## 2018-09-06 DIAGNOSIS — Z1389 Encounter for screening for other disorder: Secondary | ICD-10-CM | POA: Diagnosis not present

## 2018-09-06 DIAGNOSIS — Z954 Presence of other heart-valve replacement: Secondary | ICD-10-CM | POA: Diagnosis not present

## 2018-09-06 LAB — RESPIRATORY ALLERGY PROFILE REGION II ~~LOC~~
Allergen, A. alternata, m6: <0.1 kU/L
Allergen, Cedar tree, t12: <0.1 kU/L
Allergen, Comm Silver Birch, t9: <0.1 kU/L
Allergen, Cottonwood, t14: <0.1 kU/L
Allergen, D pternoyssinus,d7: <0.1 kU/L
Allergen, Mouse Urine Protein, e78: <0.1 kU/L
Allergen, Mulberry, t76: <0.1 kU/L
Allergen, Oak,t7: <0.1 kU/L
Allergen, P. notatum, m1: 0.14 kU/L — ABNORMAL HIGH
Aspergillus fumigatus, m3: <0.1 kU/L
Bermuda Grass: <0.1 kU/L
Box Elder IgE: <0.1 kU/L
CLADOSPORIUM HERBARUM (M2) IGE: <0.1 kU/L
COMMON RAGWEED (SHORT) (W1) IGE: <0.1 kU/L
Cat Dander: <0.1 kU/L
Class: 0
Class: 0
Class: 0
Class: 0
Class: 0
Class: 0
Class: 0
Class: 0
Class: 0
Class: 0
Class: 0
Class: 0
Class: 0
Class: 0
Class: 0
Class: 0
Class: 0
Class: 0
Class: 0
Class: 0
Class: 0
Class: 0
Class: 0
Class: 0
Cockroach: <0.1 kU/L
D. farinae: <0.1 kU/L
Dog Dander: <0.1 kU/L
Elm IgE: <0.1 kU/L
IgE (Immunoglobulin E), Serum: 62 kU/L (ref <=114)
Johnson Grass: <0.1 kU/L
Pecan/Hickory Tree IgE: <0.1 kU/L
Rough Pigweed  IgE: <0.1 kU/L
Sheep Sorrel IgE: <0.1 kU/L
Timothy Grass: <0.1 kU/L

## 2018-09-06 LAB — INTERPRETATION:

## 2018-09-08 ENCOUNTER — Other Ambulatory Visit: Payer: Self-pay

## 2018-09-08 ENCOUNTER — Ambulatory Visit: Payer: Medicare Other

## 2018-09-08 ENCOUNTER — Ambulatory Visit (HOSPITAL_BASED_OUTPATIENT_CLINIC_OR_DEPARTMENT_OTHER)
Admission: RE | Admit: 2018-09-08 | Discharge: 2018-09-08 | Disposition: A | Discharge status: Home / Self Care | Payer: Medicare Other | Source: Ambulatory Visit | Attending: Pulmonary Disease | Admitting: Pulmonary Disease

## 2018-09-08 DIAGNOSIS — R0602 Shortness of breath: Secondary | ICD-10-CM | POA: Diagnosis not present

## 2018-09-08 DIAGNOSIS — G4733 Obstructive sleep apnea (adult) (pediatric): Secondary | ICD-10-CM

## 2018-09-08 DIAGNOSIS — R0600 Dyspnea, unspecified: Secondary | ICD-10-CM

## 2018-09-10 DIAGNOSIS — G4733 Obstructive sleep apnea (adult) (pediatric): Secondary | ICD-10-CM | POA: Diagnosis not present

## 2018-09-12 DIAGNOSIS — G4733 Obstructive sleep apnea (adult) (pediatric): Secondary | ICD-10-CM | POA: Diagnosis not present

## 2018-09-19 DIAGNOSIS — Z7901 Long term (current) use of anticoagulants: Secondary | ICD-10-CM | POA: Diagnosis not present

## 2018-09-19 DIAGNOSIS — I4821 Permanent atrial fibrillation: Secondary | ICD-10-CM | POA: Diagnosis not present

## 2018-09-26 ENCOUNTER — Ambulatory Visit: Payer: Medicare Other | Admitting: Cardiology

## 2018-09-26 ENCOUNTER — Telehealth: Payer: Self-pay

## 2018-09-26 NOTE — Telephone Encounter (Signed)
   Son informed to wear masks.  COVID-19 Pre-Screening Questions:  . In the past 7 to 10 days have you had a cough,  shortness of breath, headache, congestion, fever (100 or greater) body aches, chills, sore throat, or sudden loss of taste or sense of smell? NO . Have you been around anyone with known Covid 19. No . Have you been around anyone who is awaiting Covid 19 test results in the past 7 to 10 days? No . Have you been around anyone who has been exposed to Covid 19, or has mentioned symptoms of Covid 19 within the past 7 to 10 days? No  If you have any concerns/questions about symptoms patients report during screening (either on the phone or at [facility]). Contact the provider seeing the patient or DOD for further guidance.  If neither are available contact a member of the leadership team.

## 2018-09-28 ENCOUNTER — Ambulatory Visit (INDEPENDENT_AMBULATORY_CARE_PROVIDER_SITE_OTHER): Payer: Medicare Other | Admitting: Pulmonary Disease

## 2018-09-28 ENCOUNTER — Encounter: Payer: Self-pay | Admitting: Pulmonary Disease

## 2018-09-28 ENCOUNTER — Other Ambulatory Visit: Payer: Self-pay

## 2018-09-28 VITALS — BP 116/70 | HR 60 | Temp 97.7°F | Ht 63.0 in | Wt 155.8 lb

## 2018-09-28 DIAGNOSIS — G4733 Obstructive sleep apnea (adult) (pediatric): Secondary | ICD-10-CM

## 2018-09-28 DIAGNOSIS — I255 Ischemic cardiomyopathy: Secondary | ICD-10-CM | POA: Diagnosis not present

## 2018-09-28 DIAGNOSIS — Z9189 Other specified personal risk factors, not elsewhere classified: Secondary | ICD-10-CM

## 2018-09-28 DIAGNOSIS — R0600 Dyspnea, unspecified: Secondary | ICD-10-CM | POA: Diagnosis not present

## 2018-09-28 DIAGNOSIS — Z889 Allergy status to unspecified drugs, medicaments and biological substances status: Secondary | ICD-10-CM

## 2018-09-28 NOTE — Patient Instructions (Addendum)
I have reviewed the CT scan which shows very mild scarring in the lung We will monitor this with annual CT scan and lung function test Continue the albuterol inhaler We will make a referral to allergy for evaluation of your allergy symptoms Start you on CPAP AutoSet 5-15 Follow-up in 2 months with download.

## 2018-09-28 NOTE — Progress Notes (Signed)
Tanner Ferguson    [id number]    1927/10/05  Primary Care Physician:Husain, Denton Ar, MD  Referring Physician: Wenda Low, MD [place] [employer] [place] [address],  [place] [address]  Chief complaint: Follow-up for asbestosis, OSA  HPI: 85 year old with history of atrial fibrillation, coronary artery disease, CHF, asbestos exposure in the past, aortic valve replacement, prostate cancer.  Complains of dyspnea with wheezing for the past several months.  He had been initially started on Pulmicort inhaler and then switched to Symbicort.  Treated with doxycycline and prednisone in June 2020 Reports improvement in symptoms.  States that breathing is much improved.  Denies any cough, sputum production.  Has chronic dyspnea on exertion PFTs show mild restriction and has been referred here for evaluation of interstitial lung disease.   Previously evaluated with [place] in 2016 with CT scan and pulmonary function test which showed pleural plaques, no pulmonary fibrosis and mild restriction.  Has history of MI in September 2019.  He started on amiodarone in October 2019 for atrial fibrillation.  He also has a mechanical aortic valve and is on chronic Coumadin anticoagulation.  Pets: No pets Occupation: Used to work in plumbing, heating and cooling.  Retired in 2014  Exposures, ILD questionnaire 09/05/2018: Exposure to asbestos.  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 No mold, hot tub, Jacuzzi  Smoking history: Never smoker Travel history: Lived in [place] for most of his life. Relevant family history: No significant family history of lung disease  Interim history: Here for follow-up and review of CT scan Is using the Symbicort twice a day.  Has stable symptoms of dyspnea with wheezing, seasonal allergies  Outpatient Encounter Medications as of 09/28/2018  Medication Sig  . albuterol (VENTOLIN HFA) 108 (90 Base)  MCG/ACT inhaler   . amiodarone (PACERONE) 200 MG tablet Take 0.5 tablets (100 mg total) by mouth daily.  Marland Kitchen aspirin EC 81 MG tablet Take 81 mg by mouth daily.  . rosuvastatin (CRESTOR) 20 MG tablet Take 1 tablet (20 mg total) by mouth daily.  . SYMBICORT 80-4.5 MCG/ACT inhaler Inhale 2 puffs into the lungs 2 (two) times a day.   . tamsulosin (FLOMAX) 0.4 MG CAPS capsule Take 0.4 mg by mouth daily.  Marland Kitchen warfarin (COUMADIN) 1 MG tablet Take 1 mg by mouth daily. With the 2.5 mg for a total of 3.5 mg daily  . warfarin (COUMADIN) 2.5 MG tablet Take 2.5 mg by mouth daily. With 1 mg for a total of 3.5 mg  . lisinopril (ZESTRIL) 10 MG tablet Take 10 mg by mouth daily.   . phenytoin (DILANTIN) 100 MG ER capsule Take 100 mg by mouth daily.    No facility-administered encounter medications on file as of 09/28/2018.    Physical Exam: Blood pressure 116/70, pulse 60, temperature 97.7 F (36.5 C), temperature source Oral, height 5\' 3"  (1.6 m), weight 155 lb 12.8 oz (70.7 kg), SpO2 96 %. Gen:      No acute distress HEENT:  EOMI, sclera anicteric Neck:     No masses; no thyromegaly Lungs:    Clear to auscultation bilaterally; normal respiratory effort CV:         Regular rate and rhythm; no murmurs Abd:      + bowel sounds; soft, non-tender; no palpable masses, no distension Ext:    No edema; adequate peripheral perfusion Skin:      Warm and dry; no  rash Neuro: alert and oriented x 3 Psych: normal mood and affect  Data Reviewed: Imaging: CT chest 07/30/2014-calcified pleural plaques with no clear evidence of fibrosis, cardiomegaly.  CT high-resolution 09/08/2018- calcified pleural plaques.  Mild bibasal pulmonary fibrosis with no traction bronchiectasis or honeycombing.  Probable UIP pattern.  I reviewed the images personally.  PFTs: 01/11/2015 ([place]) -FVC 91%, FEV 102%, F/F 75%. Lung volumes show mild restriction.  Diffusion capacity is normal.  08/09/2018 FVC 1.94 (76%), FEV1 1.50 [89%), F/F  77, TLC 4.06 [71%], DLCO 13.62 [72%], DLCO/VA 109% Minimal restriction and minimal diffusion defect which corrects alveolar volume.  Labs: CBC 09/05/2018-WBC 3.3, eos 5.2%, absolute eosinophil count 1716 Blood allergy profile 09/05/2018- IgE 62, RAST panel negative  Sleep PSG [facility])  09/13/2013- positive for clinically significant sleep disordered breathing.  Cardiac Echo 11/04/2017-EF 40-45%, concentric LVH, mild chronic aortic valve with trivial AR.  Assessment:  Asbestosis Patient has history of asbestos exposure.  CT is reviewed with very mild fibrosis and pleural plaques He has minimal restriction and diffusion defect on lung function tests  No treatment needed at present.  We will continue to monitor this with annual CT scan and PFTs There is a concern for amiodarone toxicity but the imaging is not typical.  Do not feel we need to stop amiodarone. Per cardiology discussion he does not have very good alternatives in terms of antiarrhythmics  Mild asthma Symptoms of dyspnea, wheezing.  Likely has mild asthma, reactive airway disease Continue Symbicort Will benefit from allergy referral.  Sleep apnea Sleep study with mild apnea Start AutoSet CPAP  Plan/Recommendations: - Continue Symbicort - Allergy referral - AutoSet CPAP 5-15  Marshell Garfinkel MD [facility] 09/28/2018, 1:42 PM  CC: Wenda Low, MD

## 2018-09-28 NOTE — Addendum Note (Signed)
Addended by: Hildred Alamin I on: 09/28/2018 02:32 PM   Modules accepted: Orders

## 2018-09-29 ENCOUNTER — Ambulatory Visit: Payer: Medicare Other | Admitting: Cardiology

## 2018-10-02 ENCOUNTER — Telehealth: Payer: Self-pay | Admitting: Cardiology

## 2018-10-02 ENCOUNTER — Other Ambulatory Visit: Payer: Self-pay

## 2018-10-02 ENCOUNTER — Ambulatory Visit (INDEPENDENT_AMBULATORY_CARE_PROVIDER_SITE_OTHER): Payer: Medicare Other | Admitting: Cardiology

## 2018-10-02 ENCOUNTER — Encounter: Payer: Self-pay | Admitting: Cardiology

## 2018-10-02 VITALS — BP 138/64 | HR 59 | Temp 97.9°F | Ht 63.0 in | Wt 152.0 lb

## 2018-10-02 DIAGNOSIS — I480 Paroxysmal atrial fibrillation: Secondary | ICD-10-CM

## 2018-10-02 DIAGNOSIS — Z952 Presence of prosthetic heart valve: Secondary | ICD-10-CM

## 2018-10-02 DIAGNOSIS — I2510 Atherosclerotic heart disease of native coronary artery without angina pectoris: Secondary | ICD-10-CM

## 2018-10-02 DIAGNOSIS — I255 Ischemic cardiomyopathy: Secondary | ICD-10-CM | POA: Diagnosis not present

## 2018-10-02 DIAGNOSIS — R0609 Other forms of dyspnea: Secondary | ICD-10-CM

## 2018-10-02 DIAGNOSIS — I5042 Chronic combined systolic (congestive) and diastolic (congestive) heart failure: Secondary | ICD-10-CM | POA: Diagnosis not present

## 2018-10-02 DIAGNOSIS — I2584 Coronary atherosclerosis due to calcified coronary lesion: Secondary | ICD-10-CM | POA: Diagnosis not present

## 2018-10-02 DIAGNOSIS — Z7901 Long term (current) use of anticoagulants: Secondary | ICD-10-CM | POA: Diagnosis not present

## 2018-10-02 NOTE — Patient Instructions (Signed)
Medication Instructions:  Stop: Lisinopril 10 mg  If you need a refill on your cardiac medications before your next appointment, please call your pharmacy.   Lab work: None  Testing/Procedures: Your physician has requested that you have an echocardiogram. Echocardiography is a painless test that uses sound waves to create images of your heart. It provides your doctor with information about the size and shape of your heart and how well your heart's chambers and valves are working. This procedure takes approximately one hour. There are no restrictions for this procedure. [place] [address]   Follow-Up: At [facility], you and your health needs are our priority.  As part of our continuing mission to provide you with exceptional heart care, we have created designated Provider Care Teams.  These Care Teams include your primary Cardiologist (physician) and Advanced Practice Providers (APPs -  Physician Assistants and Nurse Practitioners) who all work together to provide you with the care you need, when you need it. You will need a virtual follow up appointment in 1 months.  Please call our office 2 months in advance to schedule this appointment.  You may see Buford Dresser, MD or one of the following Advanced Practice Providers on your designated Care Team:   Rosaria Ferries, PA-C . Jory Sims, DNP, ANP

## 2018-10-02 NOTE — Telephone Encounter (Signed)
Spoke to pt son to remind of pt appt today with Dr. Harrell Gave at 3:20. Pt son verbalized thanks for the call and stated they will be here.

## 2018-10-02 NOTE — Progress Notes (Signed)
Cardiology Office Note:    Date:  10/02/2018   ID:  Tanner Ferguson, DOB 01-Jul-1927, MRN [id number]  PCP:  Wenda Low, MD  Cardiologist:  Buford Dresser, MD PhD  Referring MD: Wenda Low, MD   CC: follow up  History of Present Illness:    Tanner Ferguson is a 85 y.o. male with a hx of aortic stenosis s/p mechanical AVR (1995), CAD with MI 11/2017, ischemic cardiomyopathy, paroxysmal atrial fibrillation, obstructive sleep apnea, hypertension, hyperlipidemia  who is seen for follow up today. Please see my initial note from 03/09/18 for full summary of his cardiac history.  Today:  Here with son. Somewhat more alert/active since stopping phenytoin. However, still with limited exercise tolerance and dyspnea on exertion. Feels like he gives out when walking. [place], but has to stop after about 4 laps. Leg pain stops him first, then breathing. No swelling. Shortness of breath has been stable. He has dry cough with white sputum, possibly slightly better since stopping lisinopril. Wheezing seems to come and go, but overall improved with inhalers.   I reviewed Dr. Matilde Bash notes from Tanner Ferguson 09/28/18 visit. My interpretation of the plan is for continued monitoring of the asbestosis given mild fibrosis, pleural plaques, and minimal restriction/diffusion defects on PFTs. Does not think imaging is typical for amiodarone toxicity, ok to continue. Possibly has mild asthma, planned for allergy referral. Started on AutoPAP for sleep apnea.  I unfortunately cannot see Dr. Glenna Durand notes, but Mr. Sedlacek son reports that there have been issues with low blood pressure and abnormal kidney function. He has stopped lisinopril daily, was only using PRN. Has follow up bloodwork next week to monitor kidney function.   Heart rates at home have mid-50s. No known bouts of afib.   Denies chest pain, shortness of breath at rest. No PND, orthopnea, LE edema. No syncope or palpitations.   Past Medical History:  Diagnosis Date  . CAD (coronary artery disease)    s/p NSTEMI 9/19. Cath in [place], [place] 10/19. Severe CAD not amenable to PCI. Not CABG candidate  . CHF (congestive heart failure) (HCC)    EF 40-45% by echo due to iCM. EF 36% by Myoview 9/19  . Essential tremor   . Hyperlipidemia    Intolerant of statins  . Hypertension   . PAF (paroxysmal atrial fibrillation) ([place])   . Prostate cancer [facility])     Current Medications: Current Outpatient Medications on File Prior to Visit  Medication Sig  . albuterol (VENTOLIN HFA) 108 (90 Base) MCG/ACT inhaler   . amiodarone (PACERONE) 200 MG tablet Take 0.5 tablets (100 mg total) by mouth daily.  Marland Kitchen aspirin EC 81 MG tablet Take 81 mg by mouth daily.  Marland Kitchen lisinopril (ZESTRIL) 10 MG tablet Take 10 mg by mouth daily.   . phenytoin (DILANTIN) 100 MG ER capsule Take 100 mg by mouth daily.   . rosuvastatin (CRESTOR) 20 MG tablet Take 1 tablet (20 mg total) by mouth daily.  . SYMBICORT 80-4.5 MCG/ACT inhaler Inhale 2 puffs into the lungs 2 (two) times a day.   . tamsulosin (FLOMAX) 0.4 MG CAPS capsule Take 0.4 mg by mouth daily.  Marland Kitchen warfarin (COUMADIN) 1 MG tablet Take 1 mg by mouth daily. With the 2.5 mg for a total of 3.5 mg daily  . warfarin (COUMADIN) 2.5 MG tablet Take 2.5 mg by mouth daily. With 1 mg for a total of 3.5 mg   No current facility-administered medications on file prior  to visit.      Allergies:   Penicillins   Social History   Socioeconomic History  . Marital status: Widowed    Spouse name: Not on file  . Number of children: Not on file  . Years of education: Not on file  . Highest education level: Not on file  Occupational History  . Not on file  Social Needs  . Financial resource strain: Not on file  . Food insecurity    Worry: Not on file    Inability: Not on file  . Transportation needs    Medical: Not on file    Non-medical: Not on file  Tobacco Use  . Smoking status: Never Smoker  . Smokeless  tobacco: Former Systems developer    Types: Chew  Substance and Sexual Activity  . Alcohol use: Not Currently  . Drug use: Not on file  . Sexual activity: Not on file  Lifestyle  . Physical activity    Days per week: Not on file    Minutes per session: Not on file  . Stress: Not on file  Relationships  . Social Herbalist on phone: Not on file    Gets together: Not on file    Attends religious service: Not on file    Active member of club or organization: Not on file    Attends meetings of clubs or organizations: Not on file    Relationship status: Not on file  Other Topics Concern  . Not on file  Social History Narrative  . Not on file     Family History: No FH of premature CAD  ROS:   Please see the history of present illness.  Additional pertinent ROS: Constitutional: Negative for chills, fever, night sweats, unintentional weight loss  HENT: Negative for ear pain and hearing loss.   Eyes: Negative for loss of vision and eye pain.  Respiratory: See HPI. Cardiovascular: See HPI. Gastrointestinal: Negative for abdominal pain, melena, and hematochezia.  Genitourinary: Negative for dysuria and hematuria.  Musculoskeletal: Negative for falls and myalgias.  Skin: Negative for itching and rash.  Neurological: Negative for focal weakness, focal sensory changes and loss of consciousness. Positive for tremors. Endo/Heme/Allergies: Does bruise/bleed easily.     EKGs/Labs/Other Studies Reviewed:    The following studies were reviewed today: Recent pulmonary notes/studies  EKG:  EKG is personally reviewed.  The ekg ordered 03/09/18 demonstrates sinus brady, 1st degree AV block, IVCD, nonspecific Twave changes  Recent Labs: 01/25/2018: ALT 12; B Natriuretic Peptide 328.3; BUN 16; Creatinine, Ser 1.47; Potassium 5.3; Sodium 131 09/05/2018: Hemoglobin 11.9; Platelets 135.0  Recent Lipid Panel No results found for: CHOL, TRIG, HDL, CHOLHDL, VLDL, LDLCALC, LDLDIRECT  Physical Exam:     VS:  BP 138/64   Pulse (!) 59   Temp 97.9 F (36.6 C)   Ht 5\' 3"  (1.6 m)   Wt 152 lb (68.9 kg)   BMI 26.93 kg/m     Wt Readings from Last 3 Encounters:  10/02/18 152 lb (68.9 kg)  09/28/18 155 lb 12.8 oz (70.7 kg)  09/05/18 157 lb 3.2 oz (71.3 kg)     GEN: Well nourished, well developed in no acute distress HEENT: Normal, moist mucous membranes NECK: No JVD CARDIAC: regular rhythm, normal S1 and crisp mechanical S2, no rubs, gallops. 1/6 systolic ejection murmur VASCULAR: Radial and DP pulses 2+ bilaterally. No carotid bruits RESPIRATORY:  Velcro breath sounds at bilateral bases, no wheezing, no consolidation ABDOMEN: Soft, non-tender, non-distended MUSCULOSKELETAL:  Ambulates independently SKIN: Warm and dry, no edema NEUROLOGIC:  Alert, answers questions appropriately. No focal neuro deficits noted. +Tremor PSYCHIATRIC:  Normal affect    ASSESSMENT:    1. DOE (dyspnea on exertion)   2. Ischemic cardiomyopathy   3. History of mechanical aortic valve replacement   4. Anticoagulant long-term use   5. Paroxysmal atrial fibrillation (HCC)   6. Coronary artery disease due to calcified coronary lesion   7. Chronic combined systolic and diastolic heart failure (HCC)    PLAN:    Dyspnea on exertion, limited exercise tolerance: slightly improved breathing with inhalers. No other heart failure symptoms described. -appreciate pulmonology input given his complexity -slightly improved on inhalers but not at prior baseline -not felt to be typical for amiodarone toxicity -exam today without clinical evidence of heart failure. However, has been nearly a year since his echo in [place], and his condition has declined since then.  Will recheck echocardiogram for further evaluation -no infectious symptoms.   Chronic systolic and diastolic heart failure, 2/2 ischemic cardiomyopathy. EF 40-45% -recheck echo as above given his progressive symptoms -no beta blocker due to bradycardia  -had cough on lisinopril, slightly improved being off of it -being followed by Dr. Lysle Rubens for kidney injury per patient's son. Plan for recheck next week. Most recent Cr 1.6 -if kidney function improving, I would recommend a trial of losartan 12.5 mg daily as he is otherwise not on/able to tolerate any heart failure medications. If he is started on losartan, I would recheck his kidney function and potassium two weeks after starting the medication. -I would stop the lisinopril entirely (currently using PRN) given kidney function and cough -given baseline elevated potassium, no room for spironolactone -per Dr. Clayborne Dana note, discussed entresto previously. Given his potassium and kidney issues, would not recommend at this time.  Paroxysmal atrial fibrillation: no recent palpitations, in sinus rhythm currently -very symptomatic when in afib RVR, required 2 cardioversions in the fall -coumadin monitored by Dr. Glenna Durand office.  -CHA2DS2/VAS Stroke Risk Points=4, but irrelevant as he needs to be on coumadin for his mechanical valve. -continue amiodarone  CAD s/p NSTEMI 2019, with diffuse severe disease seen on cath: no recent chest pain. DOE may be an anginal equivalent if no other cause found -continue aspirin -continuerosuvastatin -resting sinus bradycardia. No room for beta blocker -no chest pain, but prior symptoms was shortness of breath.Wheezing is not consistent with his prior symptoms, but need to continue to monitor closely. -medical management  History of mechanical AVR, on coumadin -continue coumadin and aspirin -SBE prophylaxis with dental procedures  Plan for follow up: virtual appt in 1 mos  Medication Adjustments/Labs and Tests Ordered: Current medicines are reviewed at length with the patient today.  Concerns regarding medicines are outlined above.  Orders Placed This Encounter  Procedures  . ECHOCARDIOGRAM COMPLETE   No orders of the defined types were placed in  this encounter.   Patient Instructions  Medication Instructions:  Stop: Lisinopril 10 mg  If you need a refill on your cardiac medications before your next appointment, please call your pharmacy.   Lab work: None  Testing/Procedures: Your physician has requested that you have an echocardiogram. Echocardiography is a painless test that uses sound waves to create images of your heart. It provides your doctor with information about the size and shape of your heart and how well your heart's chambers and valves are working. This procedure takes approximately one hour. There are no restrictions for this procedure. [address]  [employer]. [address]   Follow-Up: At [facility], you and your health needs are our priority.  As part of our continuing mission to provide you with exceptional heart care, we have created designated Provider Care Teams.  These Care Teams include your primary Cardiologist (physician) and Advanced Practice Providers (APPs -  Physician Assistants and Nurse Practitioners) who all work together to provide you with the care you need, when you need it. You will need a virtual follow up appointment in 1 months.  Please call our office 2 months in advance to schedule this appointment.  You may see Buford Dresser, MD or one of the following Advanced Practice Providers on your designated Care Team:   Rosaria Ferries, PA-C . Jory Sims, DNP, ANP       Signed, Buford Dresser, MD PhD 10/02/2018 6:28 PM    [place]

## 2018-10-13 DIAGNOSIS — I10 Essential (primary) hypertension: Secondary | ICD-10-CM | POA: Diagnosis not present

## 2018-10-16 DIAGNOSIS — Z7901 Long term (current) use of anticoagulants: Secondary | ICD-10-CM | POA: Diagnosis not present

## 2018-10-16 DIAGNOSIS — I4821 Permanent atrial fibrillation: Secondary | ICD-10-CM | POA: Diagnosis not present

## 2018-10-18 ENCOUNTER — Ambulatory Visit (INDEPENDENT_AMBULATORY_CARE_PROVIDER_SITE_OTHER)
Admission: EM | Admit: 2018-10-18 | Discharge: 2018-10-18 | Disposition: A | Discharge status: Home / Self Care | Payer: Medicare Other | Source: Home / Self Care

## 2018-10-18 ENCOUNTER — Other Ambulatory Visit: Payer: Self-pay

## 2018-10-18 ENCOUNTER — Ambulatory Visit (INDEPENDENT_AMBULATORY_CARE_PROVIDER_SITE_OTHER): Payer: Medicare Other

## 2018-10-18 ENCOUNTER — Encounter (HOSPITAL_COMMUNITY): Payer: Self-pay | Admitting: Emergency Medicine

## 2018-10-18 DIAGNOSIS — L089 Local infection of the skin and subcutaneous tissue, unspecified: Secondary | ICD-10-CM

## 2018-10-18 DIAGNOSIS — M79641 Pain in right hand: Secondary | ICD-10-CM | POA: Diagnosis not present

## 2018-10-18 DIAGNOSIS — S6721XA Crushing injury of right hand, initial encounter: Secondary | ICD-10-CM | POA: Diagnosis not present

## 2018-10-18 LAB — COMPREHENSIVE METABOLIC PANEL
ALT: 14 U/L (ref 0–44)
AST: 18 U/L (ref 15–41)
Albumin: 3.6 g/dL (ref 3.5–5.0)
Alkaline Phosphatase: 52 U/L (ref 38–126)
Anion gap: 9 (ref 5–15)
BUN: 29 mg/dL — ABNORMAL HIGH (ref 8–23)
CO2: 23 mmol/L (ref 22–32)
Calcium: 9.1 mg/dL (ref 8.9–10.3)
Chloride: 98 mmol/L (ref 98–111)
Creatinine, Ser: 2.14 mg/dL — ABNORMAL HIGH (ref 0.61–1.24)
GFR calc Af Amer: 31 mL/min — ABNORMAL LOW (ref >60)
GFR calc non Af Amer: 26 mL/min — ABNORMAL LOW (ref >60)
Glucose, Bld: 84 mg/dL (ref 70–99)
Potassium: 4.4 mmol/L (ref 3.5–5.1)
Sodium: 130 mmol/L — ABNORMAL LOW (ref 135–145)
Total Bilirubin: 0.8 mg/dL (ref 0.3–1.2)
Total Protein: 7.9 g/dL (ref 6.5–8.1)

## 2018-10-18 LAB — CBC WITH DIFFERENTIAL/PLATELET
Abs Immature Granulocytes: 0.01 10*3/uL (ref 0.00–0.07)
Basophils Absolute: 0 10*3/uL (ref 0.0–0.1)
Basophils Relative: 0 %
Eosinophils Absolute: 0 10*3/uL (ref 0.0–0.5)
Eosinophils Relative: 1 %
HCT: 35 % — ABNORMAL LOW (ref 39.0–52.0)
Hemoglobin: 11.6 g/dL — ABNORMAL LOW (ref 13.0–17.0)
Immature Granulocytes: 0 %
Lymphocytes Relative: 15 %
Lymphs Abs: 0.7 10*3/uL (ref 0.7–4.0)
MCH: 30.6 pg (ref 26.0–34.0)
MCHC: 33.1 g/dL (ref 30.0–36.0)
MCV: 92.3 fL (ref 80.0–100.0)
Monocytes Absolute: 0.6 10*3/uL (ref 0.1–1.0)
Monocytes Relative: 14 %
Neutro Abs: 3.1 10*3/uL (ref 1.7–7.7)
Neutrophils Relative %: 70 %
Platelets: 123 10*3/uL — ABNORMAL LOW (ref 150–400)
RBC: 3.79 MIL/uL — ABNORMAL LOW (ref 4.22–5.81)
RDW: 13.2 % (ref 11.5–15.5)
WBC: 4.4 10*3/uL (ref 4.0–10.5)
nRBC: 0 % (ref 0.0–0.2)

## 2018-10-18 MED ORDER — AMOXICILLIN-POT CLAVULANATE 875-125 MG PO TABS: 1.0000 | ORAL_TABLET | Freq: Two times a day (BID) | ORAL | 0 refills | Status: DC

## 2018-10-18 MED ORDER — CEFTRIAXONE SODIUM 1 G IJ SOLR
INTRAMUSCULAR | Status: AC
  Filled 2018-10-18: qty 10

## 2018-10-18 MED ORDER — CEFTRIAXONE SODIUM 1 G IJ SOLR
1.0000 g | Freq: Once | INTRAMUSCULAR | Status: AC
  Administered 2018-10-18: 1 g via INTRAMUSCULAR

## 2018-10-18 NOTE — Discharge Instructions (Signed)
The x ray was normal We will treat for infection Antibiotics given here in the clinic.  Sending home with Augmentin I will call with anything concerning on the blood work.  If the hand becomes way worse with more severe selling and redness he will need to go to the ER.

## 2018-10-18 NOTE — ED Provider Notes (Signed)
[facility]    CSN: [id number] Arrival date & time: 10/18/18  1350     History   Chief Complaint Chief Complaint  Patient presents with  . Finger Injury    HPI Tanner Ferguson is a 85 y.o. male.   Pt is a 85 year old male that presents with right ring finger injury that occurred a few days ago. Believes that he may have smashed it between a chair and table. Not really sure. Since he has had pain and swelling. He tried to make a small incision in the finger to "relieve the pressure" per family. This morning he woke up with significant erythema, swelling and mild drainage. Ring stuck on the finger.          Past Medical History:  Diagnosis Date  . CAD (coronary artery disease)    s/p NSTEMI 9/19. Cath in [place], [place] 10/19. Severe CAD not amenable to PCI. Not CABG candidate  . CHF (congestive heart failure) (HCC)    EF 40-45% by echo due to iCM. EF 36% by Myoview 9/19  . Essential tremor   . Hyperlipidemia    Intolerant of statins  . Hypertension   . PAF (paroxysmal atrial fibrillation) ([place])   . Prostate cancer [facility])     Patient Active Problem List   Diagnosis Date Noted  . History of mechanical aortic valve replacement 03/09/2018  . Chronic systolic heart failure ([place]) 03/09/2018  . Anticoagulant long-term use 03/09/2018  . Ischemic cardiomyopathy 03/09/2018  . Coronary artery disease   . Paroxysmal atrial fibrillation ([place]) 01/25/2018    History reviewed. No pertinent surgical history.     Home Medications    Prior to Admission medications   Medication Sig Start Date End Date Taking? Authorizing Provider  albuterol (VENTOLIN HFA) 108 (90 Base) MCG/ACT inhaler  08/01/18   [provider]  amiodarone (PACERONE) 200 MG tablet Take 0.5 tablets (100 mg total) by mouth daily. 03/13/18   Buford Dresser, MD  amoxicillin-clavulanate (AUGMENTIN) 875-125 MG tablet Take 1 tablet by mouth every 12 (twelve) hours. 10/18/18   Loura Halt A, NP   aspirin EC 81 MG tablet Take 81 mg by mouth daily.    [provider]  rosuvastatin (CRESTOR) 20 MG tablet Take 1 tablet (20 mg total) by mouth daily. 03/09/18 09/28/18  Buford Dresser, MD  SYMBICORT 80-4.5 MCG/ACT inhaler Inhale 2 puffs into the lungs 2 (two) times a day.  08/11/18   [provider]  tamsulosin (FLOMAX) 0.4 MG CAPS capsule Take 0.4 mg by mouth daily.    [provider]  warfarin (COUMADIN) 1 MG tablet Take 1 mg by mouth daily. With the 2.5 mg for a total of 3.5 mg daily    [provider]  warfarin (COUMADIN) 2.5 MG tablet Take 2.5 mg by mouth daily. With 1 mg for a total of 3.5 mg    [provider]    Family History Family History  Family history unknown: Yes    Social History Social History   Tobacco Use  . Smoking status: Never Smoker  . Smokeless tobacco: Former Systems developer    Types: Chew  Substance Use Topics  . Alcohol use: Not Currently  . Drug use: Not on file     Allergies   Penicillins   Review of Systems Review of Systems  Constitutional: Negative for fever.  Musculoskeletal: Positive for joint swelling.  Skin: Positive for color change and wound.  Hematological: Bruises/bleeds easily.  Physical Exam Triage Vital Signs ED Triage Vitals [10/18/18 1427]  Enc Vitals Group     BP (!) 103/49     Pulse Rate (!) 57     Resp 18     Temp 98.4 F (36.9 C)     Temp Source Oral     SpO2 95 %     Weight      Height      Head Circumference      Peak Flow      Pain Score 8     Pain Loc      Pain Edu?      Excl. in [place]?    No data found.  Updated Vital Signs BP (!) 103/49 (BP Location: Left Arm)   Pulse (!) 57   Temp 98.4 F (36.9 C) (Oral)   Resp 18   SpO2 95%   Visual Acuity Right Eye Distance:   Left Eye Distance:   Bilateral Distance:    Right Eye Near:   Left Eye Near:    Bilateral Near:     Physical Exam Vitals signs and nursing note reviewed.  Constitutional:       General: He is not in acute distress.    Appearance: Normal appearance. He is not ill-appearing, toxic-appearing or diaphoretic.  HENT:     Head: Normocephalic.     Mouth/Throat:     Pharynx: Oropharynx is clear.  Eyes:     Conjunctiva/sclera: Conjunctivae normal.  Neck:     Musculoskeletal: Normal range of motion.  Pulmonary:     Effort: Pulmonary effort is normal.  Musculoskeletal:        General: Swelling, tenderness and signs of injury present.     Comments: Ring cut off of the right ring finger. Significant swelling, erythema, tenderness to the ring finger.limited flexion.   Skin:    General: Skin is warm and dry.  Neurological:     Mental Status: He is alert.  Psychiatric:        Mood and Affect: Mood normal.      UC Treatments / Results  Labs (all labs ordered are listed, but only abnormal results are displayed) Labs Reviewed  CBC WITH DIFFERENTIAL/PLATELET - Abnormal; Notable for the following components:      Result Value   RBC 3.79 (*)    Hemoglobin 11.6 (*)    HCT 35.0 (*)    Platelets 123 (*)    All other components within normal limits  COMPREHENSIVE METABOLIC PANEL - Abnormal; Notable for the following components:   Sodium 130 (*)    BUN 29 (*)    Creatinine, Ser 2.14 (*)    GFR calc non Af Amer 26 (*)    GFR calc Af Amer 31 (*)    All other components within normal limits    EKG   Radiology Dg Hand Complete Right  Result Date: 10/18/2018 CLINICAL DATA:  Crush injury of the right hand yesterday moving 2 chairs. Pain. Initial encounter. EXAM: RIGHT HAND - COMPLETE 3+ VIEW COMPARISON:  None. FINDINGS: No acute bony or joint abnormality is identified. Multifocal interphalangeal joint osteoarthritis appears worst at the IP joint of the thumb and DIP joints of the index and long fingers. Degenerative change is also present at the scaphoid trapezium trapezoid joint. Degenerative disease is also seen at the first through fourth MCP joints. There is  chondrocalcinosis at these joints and in the triangular fibrocartilage. Atherosclerosis noted. IMPRESSION: No acute abnormality. Multifocal osteoarthritis. Degenerative change about the  first through fourth MCP joints may be due to CPPD arthropathy given chondrocalcinosis. Electronically Signed   By: Inge Rise M.D.   On: 10/18/2018 15:49    Procedures Procedures (including critical care time)  Medications Ordered in UC Medications  cefTRIAXone (ROCEPHIN) injection 1 g (1 g Intramuscular Given 10/18/18 1624)  cefTRIAXone (ROCEPHIN) 1 g injection (has no administration in time range)    Initial Impression / Assessment and Plan / UC Course  I have reviewed the triage vital signs and the nursing notes.  Pertinent labs & imaging results that were available during my care of the patient were reviewed by me and considered in my medical decision making (see chart for details).     Ring removed from the finger Sending for xray. X ray negative for any acute fracture.  Most likely swelling is from infection.  Will treat agressive with rocephin injection here in the clinic and send home with Augmentin.  Close watch and follow up  Strict precautions and instructions given.  Go to the ER for worsening swelling and erythema.   Daughter very concerned about pt kidney function He had lab work drawn a week ago at PCP and kidney function has slightly worsened since then with lab work drawn here today. Pt has been mildly fatigued and unsure how much water he has been drinking. Tried to contact Dr. Marchia Bond office and left voicemail to see if they can get him into the office this week. Labs discussed before the pt left the clinic today.    Final Clinical Impressions(s) / UC Diagnoses   Final diagnoses:  Finger infection     Discharge Instructions     The x ray was normal We will treat for infection Antibiotics given here in the clinic.  Sending home with Augmentin I will call with  anything concerning on the blood work.  If the hand becomes way worse with more severe selling and redness he will need to go to the ER.      ED Prescriptions    Medication Sig Dispense Auth. Provider   amoxicillin-clavulanate (AUGMENTIN) 875-125 MG tablet Take 1 tablet by mouth every 12 (twelve) hours. 14 tablet Loura Halt A, NP     Controlled Substance Prescriptions [place] Controlled Substance Registry consulted? Not Applicable   Orvan July, NP 10/19/18 [phone]

## 2018-10-18 NOTE — ED Triage Notes (Signed)
Pt here after smashing right ring finger last night and then had increased pain so he opened up; now more swollen and red

## 2018-10-19 ENCOUNTER — Other Ambulatory Visit: Payer: Self-pay

## 2018-10-19 ENCOUNTER — Encounter (HOSPITAL_BASED_OUTPATIENT_CLINIC_OR_DEPARTMENT_OTHER): Payer: Self-pay

## 2018-10-19 ENCOUNTER — Inpatient Hospital Stay (HOSPITAL_BASED_OUTPATIENT_CLINIC_OR_DEPARTMENT_OTHER)
Admission: EM | Admit: 2018-10-19 | Discharge: 2018-10-21 | DRG: 641 | Disposition: A | Discharge status: Home / Self Care | Payer: Medicare Other | Attending: Family Medicine | Admitting: Family Medicine

## 2018-10-19 DIAGNOSIS — S6721XA Crushing injury of right hand, initial encounter: Secondary | ICD-10-CM | POA: Diagnosis present

## 2018-10-19 DIAGNOSIS — L03011 Cellulitis of right finger: Secondary | ICD-10-CM | POA: Diagnosis present

## 2018-10-19 DIAGNOSIS — N183 Chronic kidney disease, stage 3 (moderate): Secondary | ICD-10-CM | POA: Diagnosis present

## 2018-10-19 DIAGNOSIS — Z7982 Long term (current) use of aspirin: Secondary | ICD-10-CM

## 2018-10-19 DIAGNOSIS — I5022 Chronic systolic (congestive) heart failure: Secondary | ICD-10-CM | POA: Diagnosis present

## 2018-10-19 DIAGNOSIS — R7989 Other specified abnormal findings of blood chemistry: Secondary | ICD-10-CM | POA: Diagnosis not present

## 2018-10-19 DIAGNOSIS — S61214A Laceration without foreign body of right ring finger without damage to nail, initial encounter: Secondary | ICD-10-CM | POA: Diagnosis present

## 2018-10-19 DIAGNOSIS — E871 Hypo-osmolality and hyponatremia: Secondary | ICD-10-CM | POA: Diagnosis not present

## 2018-10-19 DIAGNOSIS — Z7901 Long term (current) use of anticoagulants: Secondary | ICD-10-CM

## 2018-10-19 DIAGNOSIS — Z87891 Personal history of nicotine dependence: Secondary | ICD-10-CM

## 2018-10-19 DIAGNOSIS — G250 Essential tremor: Secondary | ICD-10-CM | POA: Diagnosis present

## 2018-10-19 DIAGNOSIS — I480 Paroxysmal atrial fibrillation: Secondary | ICD-10-CM | POA: Diagnosis present

## 2018-10-19 DIAGNOSIS — I130 Hypertensive heart and chronic kidney disease with heart failure and stage 1 through stage 4 chronic kidney disease, or unspecified chronic kidney disease: Secondary | ICD-10-CM | POA: Diagnosis present

## 2018-10-19 DIAGNOSIS — W230XXA Caught, crushed, jammed, or pinched between moving objects, initial encounter: Secondary | ICD-10-CM | POA: Diagnosis present

## 2018-10-19 DIAGNOSIS — I252 Old myocardial infarction: Secondary | ICD-10-CM

## 2018-10-19 DIAGNOSIS — Z23 Encounter for immunization: Secondary | ICD-10-CM

## 2018-10-19 DIAGNOSIS — I10 Essential (primary) hypertension: Secondary | ICD-10-CM | POA: Diagnosis present

## 2018-10-19 DIAGNOSIS — N179 Acute kidney failure, unspecified: Secondary | ICD-10-CM | POA: Diagnosis present

## 2018-10-19 DIAGNOSIS — D649 Anemia, unspecified: Secondary | ICD-10-CM | POA: Diagnosis present

## 2018-10-19 DIAGNOSIS — I2510 Atherosclerotic heart disease of native coronary artery without angina pectoris: Secondary | ICD-10-CM | POA: Diagnosis present

## 2018-10-19 DIAGNOSIS — I4891 Unspecified atrial fibrillation: Secondary | ICD-10-CM | POA: Diagnosis present

## 2018-10-19 DIAGNOSIS — Z7951 Long term (current) use of inhaled steroids: Secondary | ICD-10-CM

## 2018-10-19 DIAGNOSIS — R092 Respiratory arrest: Secondary | ICD-10-CM | POA: Diagnosis present

## 2018-10-19 DIAGNOSIS — M19041 Primary osteoarthritis, right hand: Secondary | ICD-10-CM | POA: Diagnosis present

## 2018-10-19 DIAGNOSIS — F03918 Unspecified dementia, unspecified severity, with other behavioral disturbance: Secondary | ICD-10-CM | POA: Diagnosis present

## 2018-10-19 DIAGNOSIS — E785 Hyperlipidemia, unspecified: Secondary | ICD-10-CM | POA: Diagnosis present

## 2018-10-19 DIAGNOSIS — L03113 Cellulitis of right upper limb: Secondary | ICD-10-CM | POA: Diagnosis not present

## 2018-10-19 DIAGNOSIS — F0390 Unspecified dementia without behavioral disturbance: Secondary | ICD-10-CM | POA: Diagnosis present

## 2018-10-19 DIAGNOSIS — Z20828 Contact with and (suspected) exposure to other viral communicable diseases: Secondary | ICD-10-CM | POA: Diagnosis present

## 2018-10-19 DIAGNOSIS — L03119 Cellulitis of unspecified part of limb: Secondary | ICD-10-CM

## 2018-10-19 DIAGNOSIS — Z880 Allergy status to penicillin: Secondary | ICD-10-CM

## 2018-10-19 DIAGNOSIS — Z03818 Encounter for observation for suspected exposure to other biological agents ruled out: Secondary | ICD-10-CM | POA: Diagnosis not present

## 2018-10-19 HISTORY — DX: Unspecified dementia, unspecified severity, without behavioral disturbance, psychotic disturbance, mood disturbance, and anxiety: F03.90

## 2018-10-19 LAB — CBC WITH DIFFERENTIAL/PLATELET
Abs Immature Granulocytes: 0.02 10*3/uL (ref 0.00–0.07)
Basophils Absolute: 0 10*3/uL (ref 0.0–0.1)
Basophils Relative: 0 %
Eosinophils Absolute: 0.1 10*3/uL (ref 0.0–0.5)
Eosinophils Relative: 1 %
HCT: 35 % — ABNORMAL LOW (ref 39.0–52.0)
Hemoglobin: 11.3 g/dL — ABNORMAL LOW (ref 13.0–17.0)
Immature Granulocytes: 1 %
Lymphocytes Relative: 17 %
Lymphs Abs: 0.7 10*3/uL (ref 0.7–4.0)
MCH: 30.3 pg (ref 26.0–34.0)
MCHC: 32.3 g/dL (ref 30.0–36.0)
MCV: 93.8 fL (ref 80.0–100.0)
Monocytes Absolute: 0.7 10*3/uL (ref 0.1–1.0)
Monocytes Relative: 16 %
Neutro Abs: 2.8 10*3/uL (ref 1.7–7.7)
Neutrophils Relative %: 65 %
Platelets: 125 10*3/uL — ABNORMAL LOW (ref 150–400)
RBC: 3.73 MIL/uL — ABNORMAL LOW (ref 4.22–5.81)
RDW: 13.3 % (ref 11.5–15.5)
WBC: 4.3 10*3/uL (ref 4.0–10.5)
nRBC: 0 % (ref 0.0–0.2)

## 2018-10-19 LAB — BASIC METABOLIC PANEL
Anion gap: 9 (ref 5–15)
BUN: 30 mg/dL — ABNORMAL HIGH (ref 8–23)
CO2: 22 mmol/L (ref 22–32)
Calcium: 8.5 mg/dL — ABNORMAL LOW (ref 8.9–10.3)
Chloride: 93 mmol/L — ABNORMAL LOW (ref 98–111)
Creatinine, Ser: 1.78 mg/dL — ABNORMAL HIGH (ref 0.61–1.24)
GFR calc Af Amer: 38 mL/min — ABNORMAL LOW (ref >60)
GFR calc non Af Amer: 33 mL/min — ABNORMAL LOW (ref >60)
Glucose, Bld: 89 mg/dL (ref 70–99)
Potassium: 4.4 mmol/L (ref 3.5–5.1)
Sodium: 124 mmol/L — ABNORMAL LOW (ref 135–145)

## 2018-10-19 MED ORDER — ACETAMINOPHEN 325 MG PO TABS
650.0000 mg | ORAL_TABLET | Freq: Once | ORAL | Status: AC
  Administered 2018-10-19: 650 mg via ORAL
  Filled 2018-10-19: qty 2

## 2018-10-19 MED ORDER — ACETAMINOPHEN 325 MG PO TABS: 650.0000 mg | ORAL_TABLET | Freq: Four times a day (QID) | ORAL | Status: DC | PRN

## 2018-10-19 MED ORDER — WARFARIN SODIUM 1 MG PO TABS
1.0000 mg | ORAL_TABLET | Freq: Every day | ORAL | Status: DC
  Administered 2018-10-19: 1 mg via ORAL
  Filled 2018-10-19 (×2): qty 1

## 2018-10-19 MED ORDER — AMIODARONE HCL 100 MG PO TABS
100.0000 mg | ORAL_TABLET | Freq: Every day | ORAL | Status: DC
  Filled 2018-10-19: qty 1

## 2018-10-19 MED ORDER — ASPIRIN EC 81 MG PO TBEC: 81.0000 mg | DELAYED_RELEASE_TABLET | Freq: Every day | ORAL | Status: DC

## 2018-10-19 MED ORDER — ALBUTEROL SULFATE HFA 108 (90 BASE) MCG/ACT IN AERS
2.0000 | INHALATION_SPRAY | Freq: Four times a day (QID) | RESPIRATORY_TRACT | Status: DC | PRN
  Filled 2018-10-19: qty 6.7

## 2018-10-19 MED ORDER — WARFARIN SODIUM 2.5 MG PO TABS
2.5000 mg | ORAL_TABLET | Freq: Every day | ORAL | Status: DC
  Administered 2018-10-19: 2.5 mg via ORAL
  Filled 2018-10-19: qty 1

## 2018-10-19 MED ORDER — CLINDAMYCIN PHOSPHATE 600 MG/50ML IV SOLN
600.0000 mg | Freq: Once | INTRAVENOUS | Status: AC
  Administered 2018-10-19: 600 mg via INTRAVENOUS
  Filled 2018-10-19: qty 50

## 2018-10-19 MED ORDER — CLINDAMYCIN PHOSPHATE 600 MG/50ML IV SOLN
600.0000 mg | Freq: Three times a day (TID) | INTRAVENOUS | Status: DC
  Administered 2018-10-19: 600 mg via INTRAVENOUS
  Filled 2018-10-19 (×2): qty 50

## 2018-10-19 MED ORDER — TAMSULOSIN HCL 0.4 MG PO CAPS
0.4000 mg | ORAL_CAPSULE | Freq: Every day | ORAL | Status: DC
  Administered 2018-10-19 – 2018-10-20 (×2): 0.4 mg via ORAL
  Filled 2018-10-19 (×2): qty 1

## 2018-10-19 MED ORDER — TETANUS-DIPHTH-ACELL PERTUSSIS 5-2.5-18.5 LF-MCG/0.5 IM SUSP
0.5000 mL | Freq: Once | INTRAMUSCULAR | Status: AC
  Administered 2018-10-19: 0.5 mL via INTRAMUSCULAR
  Filled 2018-10-19: qty 0.5

## 2018-10-19 MED ORDER — ROSUVASTATIN CALCIUM 20 MG PO TABS
20.0000 mg | ORAL_TABLET | Freq: Every day | ORAL | Status: DC
  Administered 2018-10-20 (×2): 20 mg via ORAL
  Filled 2018-10-19 (×3): qty 1

## 2018-10-19 MED ORDER — MOMETASONE FURO-FORMOTEROL FUM 100-5 MCG/ACT IN AERO
2.0000 | INHALATION_SPRAY | Freq: Two times a day (BID) | RESPIRATORY_TRACT | Status: DC
  Administered 2018-10-20 – 2018-10-21 (×3): 2 via RESPIRATORY_TRACT
  Filled 2018-10-19 (×2): qty 8.8

## 2018-10-19 MED ORDER — SODIUM CHLORIDE 0.9 % IV SOLN
INTRAVENOUS | Status: DC | PRN
  Administered 2018-10-19: 23:00:00 500 mL via INTRAVENOUS

## 2018-10-19 MED ORDER — SODIUM CHLORIDE 0.9 % IV BOLUS
1000.0000 mL | Freq: Once | INTRAVENOUS | Status: AC
  Administered 2018-10-19: 1000 mL via INTRAVENOUS

## 2018-10-19 NOTE — ED Notes (Signed)
Pt. Will need help when admitted ... ordering any foods  And with any [place] and with cutting foods  Pt. Has dentures and will need proper food choices.  Pt. Has tremors and they cause him to drop things.

## 2018-10-19 NOTE — ED Notes (Signed)
[facility] notiified ([name]) - patient ready for transport

## 2018-10-19 NOTE — ED Provider Notes (Signed)
Medical screening examination/treatment/procedure(s) were conducted as a shared visit with non-physician practitioner(s) and myself.  I personally evaluated the patient during the encounter. Briefly, the patient is a 85 y.o. male right hand pain/infection.  Possible concern for electrolyte abnormalities and kidney injury.  Patient with history of A. fib, essential tremor, CAD.  Was started on Augmentin yesterday for right ring finger infection.  Patient got his finger caught in between 2 chairs earlier in the week.  Made a small incision in his finger to help relieve the pressure that he was having but now appears to have an infection.  Had an x-ray yesterday that was negative.  Took 1 dose of Augmentin yesterday.  Swelling to the back of the right finger on the dorsum into the right dorsum of the hand.  There is no signs to suggest a felon or flexor tenosynovitis.  This is a cellulitis to the dorsum of the right ring finger and right hand.  This is outlined.  Family also concerned about electrolyte issues that he is had recently.  Creatinine has been abnormal.  Recently stopped lisinopril to correct for this.  Will give dose of IV clindamycin.  Likely has not really failed antibiotic therapy but is on the wrong antibiotic likely.  We will recheck his kidney function.  He has no fever.  Sodium is 124.  Creatinine is improved to 1.7.  Given electrolyte issues and cellulitis will admit for further care.  Was given normal saline bolus.  Likely hyponatremia from dehydration.  Patient admitted to medicine in stable condition.   EKG Interpretation None           Lennice Sites, DO 10/19/18 2026

## 2018-10-19 NOTE — ED Triage Notes (Addendum)
Pt smashed his finger while moving some chairs. Pt was seen at [facility] and started on abx for a finger infection. Pt has had Rocephin injection yesterday, and one dose of Augmentin today. Pt's daughter-in-law is concerned because it appears worse today. Area is bandaged in triage.

## 2018-10-19 NOTE — ED Notes (Signed)
ED Provider at bedside. 

## 2018-10-19 NOTE — ED Notes (Signed)
Land line [phone] daughter in law   [place]

## 2018-10-19 NOTE — ED Notes (Signed)
Called [facility] about Pt. meds and explained that the Pt. Takes all meds in the evenings.

## 2018-10-19 NOTE — ED Provider Notes (Addendum)
[facility] EMERGENCY DEPARTMENT Provider Note   CSN: [id number] Arrival date & time: 10/19/18  1707     History   Chief Complaint Chief Complaint  Patient presents with   Wound Check    HPI Tanner Ferguson is a 85 y.o. male.     Tanner Ferguson is a 85 y.o. male with a history of hypertension, hyperlipidemia, CHF, CAD, paroxysmal A. fib, dementia, tremor, and prostate cancer, who presents to the emergency department with concern for worsening hand infection.  Patient was seen at urgent care yesterday for evaluation of pain and swelling to the right index finger.  Patient reports he thinks he injured the finger smashing it between 2 chairs a few days prior and when he started to have increasing pain and pressure building up in the finger he punctured the finger with a pocket knife to try and help "relief pressure."  Since then he has had worsening swelling and redness over the dorsum of the finger.  X-ray did not show any evidence of fracture, and patient was given a dose of Rocephin and started on Augmentin with concern for cellulitis at urgent care yesterday.  Patient's daughter reports despite 1 dose of Augmentin and Rocephin, he has had worsening redness and swelling extending from the dorsum of the finger onto the hand.  He has had a small amount of clear drainage from the site where he punctured his finger, and has reported some increasing pain.  No fevers or chills.  Patient's daughter also reports concern over worsening kidney function, patient was seen by his PCP in July and taken off of lisinopril due to worsening renal function.  Kidney function rechecked 5 days ago and despite medication adjustments continued to have creatinine of 1.6, this was rechecked yesterday at urgent care, and creatinine had increased to 2.14.  Patient has not had any associated chest pain, shortness of breath, abdominal pain, nausea, vomiting or diarrhea.  Family reports that they provide frequent meals  and encourage hydration.  PCP had recommended that if patient seen in the ED, kidney function be rechecked again.     Past Medical History:  Diagnosis Date   CAD (coronary artery disease)    s/p NSTEMI 9/19. Cath in [place], [place] 10/19. Severe CAD not amenable to PCI. Not CABG candidate   CHF (congestive heart failure) (HCC)    EF 40-45% by echo due to iCM. EF 36% by Myoview 9/19   Essential tremor    Hyperlipidemia    Intolerant of statins   Hypertension    PAF (paroxysmal atrial fibrillation) ([place])    Prostate cancer [facility])     Patient Active Problem List   Diagnosis Date Noted   History of mechanical aortic valve replacement 38/11/1749   Chronic systolic heart failure ([place]) 03/09/2018   Anticoagulant long-term use 03/09/2018   Ischemic cardiomyopathy 03/09/2018   Coronary artery disease    Paroxysmal atrial fibrillation ([place]) 01/25/2018    History reviewed. No pertinent surgical history.      Home Medications    Prior to Admission medications   Medication Sig Start Date End Date Taking? Authorizing Provider  albuterol (VENTOLIN HFA) 108 (90 Base) MCG/ACT inhaler  08/01/18   [provider]  amiodarone (PACERONE) 200 MG tablet Take 0.5 tablets (100 mg total) by mouth daily. 03/13/18   Buford Dresser, MD  amoxicillin-clavulanate (AUGMENTIN) 875-125 MG tablet Take 1 tablet by mouth every 12 (twelve) hours. 10/18/18   Loura Halt A, NP  aspirin EC 81 MG  tablet Take 81 mg by mouth daily.    [provider]  rosuvastatin (CRESTOR) 20 MG tablet Take 1 tablet (20 mg total) by mouth daily. 03/09/18 09/28/18  Buford Dresser, MD  SYMBICORT 80-4.5 MCG/ACT inhaler Inhale 2 puffs into the lungs 2 (two) times a day.  08/11/18   [provider]  tamsulosin (FLOMAX) 0.4 MG CAPS capsule Take 0.4 mg by mouth daily.    [provider]  warfarin (COUMADIN) 1 MG tablet Take 1 mg by mouth daily. With the 2.5 mg for a total of 3.5 mg daily     [provider]  warfarin (COUMADIN) 2.5 MG tablet Take 2.5 mg by mouth daily. With 1 mg for a total of 3.5 mg    [provider]    Family History Family History  Family history unknown: Yes    Social History Social History   Tobacco Use   Smoking status: Never Smoker   Smokeless tobacco: Former Systems developer    Types: Chew  Substance Use Topics   Alcohol use: Not Currently   Drug use: Not on file     Allergies   Penicillins   Review of Systems Review of Systems  Constitutional: Negative for chills and fever.  HENT: Negative.   Eyes: Negative for visual disturbance.  Respiratory: Negative for cough and shortness of breath.   Cardiovascular: Negative for chest pain.  Gastrointestinal: Negative for abdominal pain, diarrhea, nausea and vomiting.  Genitourinary: Negative for dysuria and frequency.  Musculoskeletal: Negative for arthralgias, joint swelling and myalgias.  Skin: Positive for color change and wound.  Neurological: Positive for tremors (Chronic). Negative for dizziness, syncope and light-headedness.     Physical Exam Updated Vital Signs BP (!) 143/83 (BP Location: Left Arm)    Pulse 78    Temp 98.1 F (36.7 C) (Oral)    Resp 16    Ht 5\' 3"  (1.6 m)    Wt 70.3 kg    SpO2 100%    BMI 27.46 kg/m   Physical Exam Vitals signs and nursing note reviewed.  Constitutional:      General: He is not in acute distress.    Appearance: Normal appearance. He is well-developed and normal weight. He is not diaphoretic.     Comments: Alert, pleasantly demented, tremulous, which is baseline for the patient  HENT:     Head: Normocephalic and atraumatic.     Mouth/Throat:     Comments: Mucous membranes slightly dry Eyes:     General:        Right eye: No discharge.        Left eye: No discharge.     Pupils: Pupils are equal, round, and reactive to light.  Neck:     Musculoskeletal: Neck supple.  Cardiovascular:     Rate and Rhythm: Normal rate and  regular rhythm.     Heart sounds: Normal heart sounds.  Pulmonary:     Effort: Pulmonary effort is normal. No respiratory distress.     Breath sounds: Normal breath sounds. No wheezing or rales.     Comments: Respirations equal and unlabored, patient able to speak in full sentences, lungs clear to auscultation bilaterally Abdominal:     General: Bowel sounds are normal. There is no distension.     Palpations: Abdomen is soft. There is no mass.     Tenderness: There is no abdominal tenderness. There is no guarding.     Comments: Abdomen soft, nondistended, nontender to palpation in all quadrants  without guarding or peritoneal signs  Musculoskeletal:        General: No deformity.     Comments: Right hand with erythema and swelling over the dorsum of the right index finger with some redness and swelling extending onto the back of the hand, there is no redness or swelling on the palmar surface, there is no fluctuance and all compartments are soft, not suggestive of felon and patient is able to flex and extend the finger without difficulty or pain, no signs of flexor tenosynovitis.  Skin:    General: Skin is warm and dry.     Capillary Refill: Capillary refill takes less than 2 seconds.  Neurological:     Mental Status: He is alert.     Coordination: Coordination normal.     Comments: Speech is clear, able to follow commands. Essential tremor, able to move all extremities.  Psychiatric:        Mood and Affect: Mood normal.        Behavior: Behavior normal.            ED Treatments / Results  Labs (all labs ordered are listed, but only abnormal results are displayed) Labs Reviewed  CBC WITH DIFFERENTIAL/PLATELET - Abnormal; Notable for the following components:      Result Value   RBC 3.73 (*)    Hemoglobin 11.3 (*)    HCT 35.0 (*)    Platelets 125 (*)    All other components within normal limits  BASIC METABOLIC PANEL - Abnormal; Notable for the following components:   Sodium  124 (*)    Chloride 93 (*)    BUN 30 (*)    Creatinine, Ser 1.78 (*)    Calcium 8.5 (*)    GFR calc non Af Amer 33 (*)    GFR calc Af Amer 38 (*)    All other components within normal limits  SARS CORONAVIRUS 2    EKG None  Radiology Dg Hand Complete Right  Result Date: 10/18/2018 CLINICAL DATA:  Crush injury of the right hand yesterday moving 2 chairs. Pain. Initial encounter. EXAM: RIGHT HAND - COMPLETE 3+ VIEW COMPARISON:  None. FINDINGS: No acute bony or joint abnormality is identified. Multifocal interphalangeal joint osteoarthritis appears worst at the IP joint of the thumb and DIP joints of the index and long fingers. Degenerative change is also present at the scaphoid trapezium trapezoid joint. Degenerative disease is also seen at the first through fourth MCP joints. There is chondrocalcinosis at these joints and in the triangular fibrocartilage. Atherosclerosis noted. IMPRESSION: No acute abnormality. Multifocal osteoarthritis. Degenerative change about the first through fourth MCP joints may be due to CPPD arthropathy given chondrocalcinosis. Electronically Signed   By: Inge Rise M.D.   On: 10/18/2018 15:49    Procedures .Critical Care Performed by: Jacqlyn Larsen, PA-C Authorized by: Jacqlyn Larsen, PA-C   Critical care provider statement:    Critical care time (minutes):  45   Critical care was necessary to treat or prevent imminent or life-threatening deterioration of the following conditions:  Metabolic crisis (Hyponatremia)   Critical care was time spent personally by me on the following activities:  Discussions with consultants, evaluation of patient's response to treatment, examination of patient, ordering and performing treatments and interventions, ordering and review of laboratory studies, ordering and review of radiographic studies, pulse oximetry, re-evaluation of patient's condition, obtaining history from patient or surrogate and review of old charts    (including critical care time)  Medications  Ordered in ED Medications  Tdap (BOOSTRIX) injection 0.5 mL (0.5 mLs Intramuscular Given 10/19/18 1746)  sodium chloride 0.9 % bolus 1,000 mL (0 mLs Intravenous Stopped 10/19/18 1907)  clindamycin (CLEOCIN) IVPB 600 mg (0 mg Intravenous Stopped 10/19/18 1907)  acetaminophen (TYLENOL) tablet 650 mg (650 mg Oral Given 10/19/18 1816)     Initial Impression / Assessment and Plan / ED Course  I have reviewed the triage vital signs and the nursing notes.  Pertinent labs & imaging results that were available during my care of the patient were reviewed by me and considered in my medical decision making (see chart for details).  84 year old male who presents with worsening redness and swelling over the right hand and index finger, was seen for hand injury and infection at urgent care yesterday, placed on Augmentin, but worsening infection.  Also concern for worsening kidney function, 5 days previously was 1.6 and when checked yesterday at urgent care was found to be 2.14.  Daughter is very concerned about worsening kidney function despite changing medications at PCPs office.  Hand does appear to have increasing redness, x-ray yesterday without evidence of fracture.  And there is no fluctuance or evident felon and no evidence of flexor tenosynovitis on exam.  Appears to be primarily worsening cellulitis.  Patient would likely benefit from change in antibiotic therapy to clindamycin, will give IV dose here and will check basic labs and give IV fluids.  Patient does appear a bit dehydrated on exam.  Kidney function slightly improved today at 1.78, but he does have hyponatremia of 124, was previously 130, no leukocytosis and stable hemoglobin.  Given infection as well as worsening hyponatremia and fluctuating kidney function, patient's daughter-in-law is very concerned that he will not do well with this at home.  Will discuss case with hospitalist.  Case discussed with  Dr. Posey Pronto with [facility] hospitalist who will see and admit the patient for further evaluation of hyponatremia and kidney function and for continued treatment with IV clindamycin of hand infection, margins of erythema were marked, if progressing may need hand surgery consult.  Patient discussed with Dr. Ronnald Nian, who saw patient as well and agrees with plan.   Final Clinical Impressions(s) / ED Diagnoses   Final diagnoses:  Cellulitis of hand  Hyponatremia  Elevated serum creatinine    ED Discharge Orders    None       Jacqlyn Larsen, Hershal Coria 10/19/18 2112    Jacqlyn Larsen, PA-C 10/19/18 [place], [place], DO 10/19/18 2334

## 2018-10-19 NOTE — ED Triage Notes (Signed)
Pt's daughter-in-law states pt needs a TDaP.

## 2018-10-19 NOTE — ED Notes (Addendum)
Call pt's daughter-in-law for any questions about pt's history. Colleen Hu [phone]

## 2018-10-19 NOTE — ED Notes (Signed)
Pt has not had any of his home medications to today per daughter-in-law.

## 2018-10-20 ENCOUNTER — Encounter (HOSPITAL_COMMUNITY): Payer: Self-pay | Admitting: Internal Medicine

## 2018-10-20 DIAGNOSIS — G250 Essential tremor: Secondary | ICD-10-CM | POA: Diagnosis present

## 2018-10-20 DIAGNOSIS — D649 Anemia, unspecified: Secondary | ICD-10-CM | POA: Diagnosis present

## 2018-10-20 DIAGNOSIS — I2510 Atherosclerotic heart disease of native coronary artery without angina pectoris: Secondary | ICD-10-CM | POA: Diagnosis present

## 2018-10-20 DIAGNOSIS — F0390 Unspecified dementia without behavioral disturbance: Secondary | ICD-10-CM | POA: Diagnosis present

## 2018-10-20 DIAGNOSIS — M19041 Primary osteoarthritis, right hand: Secondary | ICD-10-CM | POA: Diagnosis present

## 2018-10-20 DIAGNOSIS — Z03818 Encounter for observation for suspected exposure to other biological agents ruled out: Secondary | ICD-10-CM | POA: Diagnosis not present

## 2018-10-20 DIAGNOSIS — E785 Hyperlipidemia, unspecified: Secondary | ICD-10-CM | POA: Diagnosis present

## 2018-10-20 DIAGNOSIS — I480 Paroxysmal atrial fibrillation: Secondary | ICD-10-CM | POA: Diagnosis present

## 2018-10-20 DIAGNOSIS — Z23 Encounter for immunization: Secondary | ICD-10-CM | POA: Diagnosis not present

## 2018-10-20 DIAGNOSIS — L03011 Cellulitis of right finger: Secondary | ICD-10-CM

## 2018-10-20 DIAGNOSIS — N179 Acute kidney failure, unspecified: Secondary | ICD-10-CM | POA: Diagnosis present

## 2018-10-20 DIAGNOSIS — I10 Essential (primary) hypertension: Secondary | ICD-10-CM | POA: Diagnosis present

## 2018-10-20 DIAGNOSIS — Z7982 Long term (current) use of aspirin: Secondary | ICD-10-CM | POA: Diagnosis not present

## 2018-10-20 DIAGNOSIS — Z880 Allergy status to penicillin: Secondary | ICD-10-CM | POA: Diagnosis not present

## 2018-10-20 DIAGNOSIS — Z87891 Personal history of nicotine dependence: Secondary | ICD-10-CM | POA: Diagnosis not present

## 2018-10-20 DIAGNOSIS — Z20828 Contact with and (suspected) exposure to other viral communicable diseases: Secondary | ICD-10-CM | POA: Diagnosis present

## 2018-10-20 DIAGNOSIS — R092 Respiratory arrest: Secondary | ICD-10-CM | POA: Diagnosis present

## 2018-10-20 DIAGNOSIS — F03918 Unspecified dementia, unspecified severity, with other behavioral disturbance: Secondary | ICD-10-CM | POA: Diagnosis present

## 2018-10-20 DIAGNOSIS — Z7901 Long term (current) use of anticoagulants: Secondary | ICD-10-CM | POA: Diagnosis not present

## 2018-10-20 DIAGNOSIS — E871 Hypo-osmolality and hyponatremia: Secondary | ICD-10-CM | POA: Diagnosis not present

## 2018-10-20 DIAGNOSIS — W230XXA Caught, crushed, jammed, or pinched between moving objects, initial encounter: Secondary | ICD-10-CM | POA: Diagnosis not present

## 2018-10-20 DIAGNOSIS — S61214A Laceration without foreign body of right ring finger without damage to nail, initial encounter: Secondary | ICD-10-CM | POA: Diagnosis present

## 2018-10-20 DIAGNOSIS — R7989 Other specified abnormal findings of blood chemistry: Secondary | ICD-10-CM | POA: Diagnosis not present

## 2018-10-20 DIAGNOSIS — I5022 Chronic systolic (congestive) heart failure: Secondary | ICD-10-CM | POA: Diagnosis present

## 2018-10-20 DIAGNOSIS — Z7951 Long term (current) use of inhaled steroids: Secondary | ICD-10-CM | POA: Diagnosis not present

## 2018-10-20 DIAGNOSIS — L03113 Cellulitis of right upper limb: Secondary | ICD-10-CM | POA: Diagnosis not present

## 2018-10-20 DIAGNOSIS — L03119 Cellulitis of unspecified part of limb: Secondary | ICD-10-CM | POA: Diagnosis not present

## 2018-10-20 DIAGNOSIS — I252 Old myocardial infarction: Secondary | ICD-10-CM | POA: Diagnosis not present

## 2018-10-20 DIAGNOSIS — I130 Hypertensive heart and chronic kidney disease with heart failure and stage 1 through stage 4 chronic kidney disease, or unspecified chronic kidney disease: Secondary | ICD-10-CM | POA: Diagnosis present

## 2018-10-20 DIAGNOSIS — S6721XA Crushing injury of right hand, initial encounter: Secondary | ICD-10-CM | POA: Diagnosis present

## 2018-10-20 DIAGNOSIS — N183 Chronic kidney disease, stage 3 (moderate): Secondary | ICD-10-CM | POA: Diagnosis present

## 2018-10-20 LAB — CBC WITH DIFFERENTIAL/PLATELET
Abs Immature Granulocytes: 0.01 10*3/uL (ref 0.00–0.07)
Basophils Absolute: 0 10*3/uL (ref 0.0–0.1)
Basophils Relative: 0 %
Eosinophils Absolute: 0.1 10*3/uL (ref 0.0–0.5)
Eosinophils Relative: 2 %
HCT: 31.8 % — ABNORMAL LOW (ref 39.0–52.0)
Hemoglobin: 10.3 g/dL — ABNORMAL LOW (ref 13.0–17.0)
Immature Granulocytes: 0 %
Lymphocytes Relative: 19 %
Lymphs Abs: 0.5 10*3/uL — ABNORMAL LOW (ref 0.7–4.0)
MCH: 30.7 pg (ref 26.0–34.0)
MCHC: 32.4 g/dL (ref 30.0–36.0)
MCV: 94.9 fL (ref 80.0–100.0)
Monocytes Absolute: 0.4 10*3/uL (ref 0.1–1.0)
Monocytes Relative: 15 %
Neutro Abs: 1.6 10*3/uL — ABNORMAL LOW (ref 1.7–7.7)
Neutrophils Relative %: 64 %
Platelets: 108 10*3/uL — ABNORMAL LOW (ref 150–400)
RBC: 3.35 MIL/uL — ABNORMAL LOW (ref 4.22–5.81)
RDW: 13.2 % (ref 11.5–15.5)
WBC: 2.6 10*3/uL — ABNORMAL LOW (ref 4.0–10.5)
nRBC: 0 % (ref 0.0–0.2)

## 2018-10-20 LAB — PROTIME-INR
INR: 2.4 — ABNORMAL HIGH (ref 0.8–1.2)
Prothrombin Time: 25.7 seconds — ABNORMAL HIGH (ref 11.4–15.2)

## 2018-10-20 LAB — COMPREHENSIVE METABOLIC PANEL
ALT: 12 U/L (ref 0–44)
AST: 18 U/L (ref 15–41)
Albumin: 3.3 g/dL — ABNORMAL LOW (ref 3.5–5.0)
Alkaline Phosphatase: 46 U/L (ref 38–126)
Anion gap: 4 — ABNORMAL LOW (ref 5–15)
BUN: 25 mg/dL — ABNORMAL HIGH (ref 8–23)
CO2: 23 mmol/L (ref 22–32)
Calcium: 8.4 mg/dL — ABNORMAL LOW (ref 8.9–10.3)
Chloride: 100 mmol/L (ref 98–111)
Creatinine, Ser: 1.48 mg/dL — ABNORMAL HIGH (ref 0.61–1.24)
GFR calc Af Amer: 48 mL/min — ABNORMAL LOW (ref >60)
GFR calc non Af Amer: 41 mL/min — ABNORMAL LOW (ref >60)
Glucose, Bld: 93 mg/dL (ref 70–99)
Potassium: 4.5 mmol/L (ref 3.5–5.1)
Sodium: 127 mmol/L — ABNORMAL LOW (ref 135–145)
Total Bilirubin: 0.4 mg/dL (ref 0.3–1.2)
Total Protein: 7.3 g/dL (ref 6.5–8.1)

## 2018-10-20 LAB — CBG MONITORING, ED: Glucose-Capillary: 89 mg/dL (ref 70–99)

## 2018-10-20 LAB — SARS CORONAVIRUS 2 (TAT 6-24 HRS): SARS Coronavirus 2: NEGATIVE

## 2018-10-20 LAB — SODIUM, URINE, RANDOM: Sodium, Ur: 74 mmol/L

## 2018-10-20 LAB — OSMOLALITY: Osmolality: 272 mOsm/kg — ABNORMAL LOW (ref 275–295)

## 2018-10-20 MED ORDER — ACETAMINOPHEN 325 MG PO TABS
650.0000 mg | ORAL_TABLET | Freq: Four times a day (QID) | ORAL | Status: DC | PRN
  Administered 2018-10-20: 650 mg via ORAL
  Filled 2018-10-20: qty 2

## 2018-10-20 MED ORDER — WARFARIN - PHARMACIST DOSING INPATIENT: Freq: Every day | Status: DC

## 2018-10-20 MED ORDER — CLINDAMYCIN PHOSPHATE 600 MG/50ML IV SOLN
600.0000 mg | Freq: Three times a day (TID) | INTRAVENOUS | Status: DC
  Administered 2018-10-20 – 2018-10-21 (×4): 600 mg via INTRAVENOUS
  Filled 2018-10-20 (×5): qty 50

## 2018-10-20 MED ORDER — AMIODARONE HCL 100 MG PO TABS
100.0000 mg | ORAL_TABLET | Freq: Every day | ORAL | Status: DC
  Administered 2018-10-20: 100 mg via ORAL
  Filled 2018-10-20: qty 1

## 2018-10-20 MED ORDER — SODIUM CHLORIDE 0.9 % IV SOLN
INTRAVENOUS | Status: DC
  Administered 2018-10-20 – 2018-10-21 (×2): via INTRAVENOUS

## 2018-10-20 MED ORDER — WARFARIN SODIUM 3 MG PO TABS
3.5000 mg | ORAL_TABLET | Freq: Once | ORAL | Status: AC
  Administered 2018-10-20: 3.5 mg via ORAL
  Filled 2018-10-20: qty 1

## 2018-10-20 MED ORDER — ALBUTEROL SULFATE (2.5 MG/3ML) 0.083% IN NEBU: 2.5000 mg | INHALATION_SOLUTION | Freq: Four times a day (QID) | RESPIRATORY_TRACT | Status: DC | PRN

## 2018-10-20 MED ORDER — ACETAMINOPHEN 650 MG RE SUPP: 650.0000 mg | Freq: Four times a day (QID) | RECTAL | Status: DC | PRN

## 2018-10-20 MED ORDER — PROCHLORPERAZINE EDISYLATE 10 MG/2ML IJ SOLN: 5.0000 mg | INTRAMUSCULAR | Status: DC | PRN

## 2018-10-20 MED ORDER — ASPIRIN EC 81 MG PO TBEC
81.0000 mg | DELAYED_RELEASE_TABLET | Freq: Every day | ORAL | Status: DC
  Administered 2018-10-20: 81 mg via ORAL
  Filled 2018-10-20: qty 1

## 2018-10-20 NOTE — Progress Notes (Signed)
ANTICOAGULATION CONSULT NOTE - Initial Consult  Pharmacy Consult for Warfarin Indication: atrial fibrillation  Allergies  Allergen Reactions  . Penicillins Rash    Patient Measurements: Height: 5\' 3"  (160 cm) Weight: 155 lb (70.3 kg) IBW/kg (Calculated) : 56.9   Vital Signs: Temp: 97.8 F (36.6 C) (08/21 0110) Temp Source: Oral (08/21 0110) BP: 144/82 (08/21 0110) Pulse Rate: 58 (08/21 0110)  Labs: Recent Labs    10/18/18 1500 10/19/18 1800  HGB 11.6* 11.3*  HCT 35.0* 35.0*  PLT 123* 125*  CREATININE 2.14* 1.78*    Estimated Creatinine Clearance: 24.8 mL/min (A) (by C-G formula based on SCr of 1.78 mg/dL (H)).   Medical History: Past Medical History:  Diagnosis Date  . CAD (coronary artery disease)    s/p NSTEMI 9/19. Cath in [place], [place] 10/19. Severe CAD not amenable to PCI. Not CABG candidate  . CHF (congestive heart failure) (HCC)    EF 40-45% by echo due to iCM. EF 36% by Myoview 9/19  . Dementia ([place])   . Essential tremor   . Hyperlipidemia    Intolerant of statins  . Hypertension   . PAF (paroxysmal atrial fibrillation) ([place])   . Prostate cancer ([place])     Medications:  Scheduled:  . amiodarone  100 mg Oral QHS  . aspirin EC  81 mg Oral QHS  . mometasone-formoterol  2 puff Inhalation BID  . rosuvastatin  20 mg Oral Daily  . tamsulosin  0.4 mg Oral Daily   Infusions:  . sodium chloride 500 mL (10/19/18 2318)  . clindamycin (CLEOCIN) IV 600 mg (10/19/18 2322)    Assessment: 39 yoM admitted with finger wound. On chronic warfarin for a-fib. HD 3.5 mg daily LD 8/20 2020  Goal of Therapy:  INR = 2-3    Plan:  Daily INR  Lawana Pai R 10/20/2018,1:36 AM

## 2018-10-20 NOTE — Progress Notes (Signed)
[facility] for Warfarin Indication: atrial fibrillation  Allergies  Allergen Reactions  . Penicillins Rash    Patient Measurements: Height: 5\' 3"  (160 cm) Weight: 155 lb (70.3 kg) IBW/kg (Calculated) : 56.9   Vital Signs: Temp: 98.1 F (36.7 C) (08/21 1309) Temp Source: Oral (08/21 0447) BP: 123/66 (08/21 1309) Pulse Rate: 55 (08/21 1309)  Labs: Recent Labs    10/18/18 1500 10/19/18 1800 10/20/18 0240  HGB 11.6* 11.3* 10.3*  HCT 35.0* 35.0* 31.8*  PLT 123* 125* 108*  CREATININE 2.14* 1.78* 1.48*    Estimated Creatinine Clearance: 29.8 mL/min (A) (by C-G formula based on SCr of 1.48 mg/dL (H)).   Medications:  Scheduled:  . amiodarone  100 mg Oral QHS  . aspirin EC  81 mg Oral QHS  . mometasone-formoterol  2 puff Inhalation BID  . rosuvastatin  20 mg Oral Daily  . tamsulosin  0.4 mg Oral Daily   Infusions:  . sodium chloride 500 mL (10/19/18 2318)  . sodium chloride 75 mL/hr at 10/20/18 1332  . clindamycin (CLEOCIN) IV 600 mg (10/20/18 1333)    Assessment: 44 yoM admitted with finger wound. On chronic warfarin for a-fib; Pharmacy to continue dosing inpatient.    Baseline INR therapeutic  Prior anticoagulation: warfarin 3.5 mg daily; LD 10/19/18  Significant events:  Today, 10/20/2018:  CBC: Hgb/Plt low but stable  INR therapeutic, no missed warf doses  Major drug interactions: none noted  No bleeding issues per nursing  Eating 75% of meals  Goal of Therapy: INR 2-3  Plan:  Warfarin 3.5 mg PO tonight at 18:00  Daily INR  CBC at least q72 hr while on warfarin  Monitor for signs of bleeding or thrombosis   Reuel Boom, PharmD, BCPS [phone] 10/20/2018, 1:48 PM

## 2018-10-20 NOTE — H&P (Signed)
History and Physical    Tanner Ferguson [id number] DOB: 05/08/1927 DOA: 10/19/2018  PCP: Wenda Low, MD   Patient coming from: [place].  I have personally briefly reviewed patient's old medical records in [place]  Chief Complaint: Finger trauma.  HPI: Tanner Ferguson is a 85 y.o. male with medical history significant of CAD, chronic systolic CHF, dementia, essential tremor, hyperlipidemia, hypertension, paroxysmal atrial fibrillation, prostate cancer who went to [facility] due to worsening of his right hand injury that he sustained on Monday despite taking Augmentin.  He denies fever, chills or night sweats.  No anorexia, rhinorrhea, sore throat, dyspnea, wheezing or hemoptysis.  Denies chest pain, palpitations, dizziness, diaphoresis, PND, orthopnea or recent pitting edema of the lower extremities.  No abdominal pain, nausea or vomiting, diarrhea or constipation, melena or hematochezia.  Denies dysuria, frequency or hematuria.  No polyuria, polydipsia, polyphagia or blurred vision.  ED Course: Initial vital signs temperature 98.1 F, pulse 78, respirations 16, blood pressure 143/83 mmHg and O2 sat 100% on room air.  The patient was given 600 mg of clindamycin.  His CBC showed a white count of 4.3, hemoglobin 11.3 g/dL and platelets 125.  BMP shows a sodium of 124, potassium 4.4, chloride 93 and CO2 22 mmol/L.  Glucose 89, BUN 30, creatinine 1.78 and calcium 8.5 mg/dL.  Right hand x-ray done on 10/18/2018 showed multifocal osteoarthritis, but no acute on normality.  Review of Systems: As per HPI otherwise 10 point review of systems negative.   Past Medical History:  Diagnosis Date   CAD (coronary artery disease)    s/p NSTEMI 9/19. Cath in [place], [place] 10/19. Severe CAD not amenable to PCI. Not CABG candidate   CHF (congestive heart failure) (HCC)    EF 40-45% by echo due to iCM. EF 36% by Myoview 9/19   Dementia (HCC)    Essential tremor     Hyperlipidemia    Intolerant of statins   Hypertension    PAF (paroxysmal atrial fibrillation) ([place])    Prostate cancer ([place])     History reviewed. No pertinent surgical history.   reports that he has never smoked. He quit smokeless tobacco use about a year ago.  His smokeless tobacco use included chew. He reports previous alcohol use. No history on file for drug.  Allergies  Allergen Reactions   Penicillins Rash    Family History  Family history unknown: Yes   Prior to Admission medications   Medication Sig Start Date End Date Taking? Authorizing Provider  albuterol (VENTOLIN HFA) 108 (90 Base) MCG/ACT inhaler Inhale 1-2 puffs into the lungs every 4 (four) hours as needed for wheezing or shortness of breath.  08/01/18  Yes [provider]  amiodarone (PACERONE) 200 MG tablet Take 0.5 tablets (100 mg total) by mouth daily. Patient taking differently: Take 100 mg by mouth every evening.  03/13/18  Yes Buford Dresser, MD  amoxicillin-clavulanate (AUGMENTIN) 875-125 MG tablet Take 1 tablet by mouth every 12 (twelve) hours. 10/18/18  Yes Bast, Traci A, NP  aspirin EC 81 MG tablet Take 81 mg by mouth every evening.    Yes [provider]  rosuvastatin (CRESTOR) 20 MG tablet Take 1 tablet (20 mg total) by mouth daily. Patient taking differently: Take 20 mg by mouth every evening.  03/09/18 10/19/18 Yes Buford Dresser, MD  SYMBICORT 80-4.5 MCG/ACT inhaler Inhale 2 puffs into the lungs 2 (two) times a day.  08/11/18  Yes [provider]  tamsulosin (FLOMAX) 0.4 MG CAPS capsule Take 0.4 mg by mouth daily after supper.    Yes [provider]  warfarin (COUMADIN) 1 MG tablet Take 1 mg by mouth See admin instructions. With the 2.5 mg for a total of 3.5 mg daily in the evenings   Yes [provider]  warfarin (COUMADIN) 2.5 MG tablet Take 2.5 mg by mouth See admin instructions. With 1 mg for a total of 3.5 mg in the evenings   Yes [provider]    Physical Exam: Vitals:   10/19/18 2230 10/19/18 2300 10/20/18 0020 10/20/18 0110  BP: (!) 120/47 139/64 129/85 (!) 144/82  Pulse: (!) 55 (!) 54 (!) 52 (!) 58  Resp: (!) 21 20 (!) 22 18  Temp:    97.8 F (36.6 C)  TempSrc:    Oral  SpO2: 99% 99% 97% 100%  Weight:      Height:        Constitutional: NAD, calm, comfortable Eyes: PERRL, lids and conjunctivae normal ENMT: Mucous membranes are moist. Posterior pharynx clear of any exudate or lesions. Neck: normal, supple, no masses, no thyromegaly Respiratory: clear to auscultation bilaterally, no wheezing, no crackles. Normal respiratory effort. No accessory muscle use.  Cardiovascular: Regular rate and rhythm, no murmurs / rubs / gallops. No extremity edema. 2+ pedal pulses. No carotid bruits.  Abdomen: Soft, no tenderness, no masses palpated. No hepatosplenomegaly. Bowel sounds positive.  Musculoskeletal: no clubbing / cyanosis.  Right hand fourth finger distal MCP area wound with surrounding ecchymosis, erythema, edema, calor and TTP. Good ROM, no contractures. Normal muscle tone.  Skin: Positive ecchymosis, erythema, edema as described earlier.  Please see picture below. Neurologic: CN 2-12 grossly intact. Sensation intact, DTR normal. Strength 5/5 in all 4.  Psychiatric: Normal judgment and insight. Alert and oriented x 3. Normal mood.             Labs on Admission: I have personally reviewed following labs and imaging studies  CBC: Recent Labs  Lab 10/18/18 1500 10/19/18 1800  WBC 4.4 4.3  NEUTROABS 3.1 2.8  HGB 11.6* 11.3*  HCT 35.0* 35.0*  MCV 92.3 93.8  PLT 123* 0000000*   Basic Metabolic Panel: Recent Labs  Lab 10/18/18 1500 10/19/18 1800  NA 130* 124*  K 4.4 4.4  CL 98 93*  CO2 23 22  GLUCOSE 84 89  BUN 29* 30*  CREATININE 2.14* 1.78*  CALCIUM 9.1 8.5*   GFR: Estimated Creatinine Clearance: 24.8 mL/min (A) (by C-G formula based on SCr of 1.78 mg/dL (H)). Liver Function  Tests: Recent Labs  Lab 10/18/18 1500  AST 18  ALT 14  ALKPHOS 52  BILITOT 0.8  PROT 7.9  ALBUMIN 3.6   No results for input(s): LIPASE, AMYLASE in the last 168 hours. No results for input(s): AMMONIA in the last 168 hours. Coagulation Profile: No results for input(s): INR, PROTIME in the last 168 hours. Cardiac Enzymes: No results for input(s): CKTOTAL, CKMB, CKMBINDEX, TROPONINI in the last 168 hours. BNP (last 3 results) No results for input(s): PROBNP in the last 8760 hours. HbA1C: No results for input(s): HGBA1C in the last 72 hours. CBG: Recent Labs  Lab 10/20/18 0015  GLUCAP 89   Lipid Profile: No results for input(s): CHOL, HDL, LDLCALC, TRIG, CHOLHDL, LDLDIRECT in the last 72 hours. Thyroid Function Tests: No results for input(s): TSH, T4TOTAL, FREET4, T3FREE, THYROIDAB in the last 72 hours. Anemia Panel: No results for input(s): VITAMINB12, FOLATE, FERRITIN, TIBC, IRON,  RETICCTPCT in the last 72 hours. Urine analysis: No results found for: COLORURINE, APPEARANCEUR, LABSPEC, [place], GLUCOSEU, HGBUR, BILIRUBINUR, KETONESUR, PROTEINUR, UROBILINOGEN, NITRITE, LEUKOCYTESUR  Radiological Exams on Admission: Dg Hand Complete Right  Result Date: 10/18/2018 CLINICAL DATA:  Crush injury of the right hand yesterday moving 2 chairs. Pain. Initial encounter. EXAM: RIGHT HAND - COMPLETE 3+ VIEW COMPARISON:  None. FINDINGS: No acute bony or joint abnormality is identified. Multifocal interphalangeal joint osteoarthritis appears worst at the IP joint of the thumb and DIP joints of the index and long fingers. Degenerative change is also present at the scaphoid trapezium trapezoid joint. Degenerative disease is also seen at the first through fourth MCP joints. There is chondrocalcinosis at these joints and in the triangular fibrocartilage. Atherosclerosis noted. IMPRESSION: No acute abnormality. Multifocal osteoarthritis. Degenerative change about the first through fourth MCP joints  may be due to CPPD arthropathy given chondrocalcinosis. Electronically Signed   By: Inge Rise M.D.   On: 10/18/2018 15:49    EKG: Independently reviewed.   Assessment/Plan Principal Problem:   Cellulitis of finger of right hand Observation/MedSurg. Analgesics as needed. Continue local care. Continue clindamycin 600 mg IVP every 8 hours. Consider hand surgeon evaluation if no improvement.  Active Problems:   Hyponatremia Per patient, he is not using diuretics. However, he has been drinking a lot of water lately. Fluid restriction. Check urine sodium. Check serum osmolality. Follow-up sodium level.    Paroxysmal atrial fibrillation (HCC) CHA?DS?-VASc Score of at least 5. Continue amiodarone for rate control. Warfarin per pharmacy.    Coronary artery disease Continue Crestor. On warfarin.    Chronic systolic heart failure (HCC) No signs of acute decompensation.    Hyperlipidemia Continue Crestor. Monitor LFTs periodically.    Hypertension Currently not on antihypertensives. Monitor blood pressure.    Dementia ([place]) Supportive care.    Anemia Monitor H&H.   DVT prophylaxis: Lovenox SQ. Code Status: Full code. Family Communication: Disposition Plan: Observation for IV antibiotics and hyponatremia work-up. Consults called: Admission status: MedSurg/observation.   Reubin Milan MD [facility]  If 7PM-7AM, please contact night-coverage [email or web address]  10/20/2018, 1:57 AM   This document was prepared using Dragon voice recognition software and may contain some unintended transcription errors.

## 2018-10-20 NOTE — Progress Notes (Signed)
Patient seen and evaluated, chart reviewed, please see EMR for updated orders. Please see full H&P dictated by admitting physician Dr. Olevia Bowens for same date of service.    Brief summary 85 y.o. male with medical history significant of CAD, chronic systolic CHF, dementia, essential tremor, hyperlipidemia, hypertension, paroxysmal atrial fibrillation, prostate cancer admitted on 10/20/2018 with right ring finger cellulitis, acute kidney injury and hyponatremia     A/p  1) acute on chronic hyponatremia--- Per available old records as per family patient's baseline sodium is usually between 128 and 131, admitted with a sodium of 124 sodium trending up to 127 -Serum osmolarity is 272 -Avoid excessive free water intake  2)AKI----acute kidney injury on CKD stage - III, worsening renal function may be due to dehydration,   creatinine on admission= 1.78,   baseline creatinine = 1.4, creatinine is now= 1.48     , renally adjust medications, avoid nephrotoxic agents / dehydration/hypotension  3) right ring finger trauma with secondary cellulitis--- continue clindamycin pending wound culture results  4) chronic atrial fibrillation--continue amiodarone for rate control, continue Coumadin for anticoagulation pharmacy to manage Coumadin therapy  Disposition--possible discharge in 1 to 2 days if renal function, sodium and cellulitis improves  Discussed with daughter-in-law Ms. Colleen  Carlton ([phone])--questions answered

## 2018-10-21 LAB — BASIC METABOLIC PANEL
Anion gap: 6 (ref 5–15)
BUN: 24 mg/dL — ABNORMAL HIGH (ref 8–23)
CO2: 21 mmol/L — ABNORMAL LOW (ref 22–32)
Calcium: 8.5 mg/dL — ABNORMAL LOW (ref 8.9–10.3)
Chloride: 101 mmol/L (ref 98–111)
Creatinine, Ser: 1.48 mg/dL — ABNORMAL HIGH (ref 0.61–1.24)
GFR calc Af Amer: 48 mL/min — ABNORMAL LOW (ref >60)
GFR calc non Af Amer: 41 mL/min — ABNORMAL LOW (ref >60)
Glucose, Bld: 86 mg/dL (ref 70–99)
Potassium: 4.8 mmol/L (ref 3.5–5.1)
Sodium: 128 mmol/L — ABNORMAL LOW (ref 135–145)

## 2018-10-21 LAB — PROTIME-INR
INR: 2.7 — ABNORMAL HIGH (ref 0.8–1.2)
Prothrombin Time: 28 seconds — ABNORMAL HIGH (ref 11.4–15.2)

## 2018-10-21 MED ORDER — ROSUVASTATIN CALCIUM 20 MG PO TABS: 20.0000 mg | ORAL_TABLET | Freq: Every evening | ORAL | 1 refills | Status: AC

## 2018-10-21 MED ORDER — ASPIRIN EC 81 MG PO TBEC: 81.0000 mg | DELAYED_RELEASE_TABLET | Freq: Every day | ORAL | 1 refills | Status: DC

## 2018-10-21 MED ORDER — CLINDAMYCIN HCL 300 MG PO CAPS: 300.0000 mg | ORAL_CAPSULE | Freq: Three times a day (TID) | ORAL | 0 refills | Status: AC

## 2018-10-21 MED ORDER — WARFARIN SODIUM 2.5 MG PO TABS: 2.5000 mg | ORAL_TABLET | Freq: Once | ORAL | Status: DC

## 2018-10-21 NOTE — Discharge Summary (Addendum)
Tanner Ferguson, is a 85 y.o. male  DOB 02-14-1928  MRN [id number].  Admission date:  10/19/2018  Admitting Physician  Lenore Cordia, MD  Discharge Date:  10/21/2018   Primary MD  Wenda Low, MD  Recommendations for primary care physician for things to follow:   1) your sodium and your kidney function has improved--okay to repeat BMP/kidney and electrolyte blood test with the primary care physician in about a week or so 2)Please Avoid ibuprofen/Advil/Aleve/Motrin/Goody Powders/Naproxen/BC powders/Meloxicam/Diclofenac/Indomethacin and other Nonsteroidal anti-inflammatory medications as these will make you more likely to bleed and can cause stomach ulcers, can also cause Kidney problems.  3) avoid drinking too much free water (limits herself to no more than 20 ounces (689ml) of free water per day, limit your total fluid intake to no more than 60 ounces (1.8L) per day of the total of 60 ounces free water should not be more than 20 ounces 4) take clindamycin 300 mg 3 times a day for 5 days for right ring finger infection--- please call if you develop diarrhea, as you may need to be tested for C. difficile infection 5) see if stopping caffeinated drink may improve your tremors 6) please continue your Coumadin and recheck your PT/INR with your regular doctor in about a week or so   Admission Diagnosis  Cellulitis of hand [L03.119] Hyponatremia [E87.1] Elevated serum creatinine [R79.89]  Discharge Diagnosis  Cellulitis of hand [L03.119] Hyponatremia [E87.1] Elevated serum creatinine [R79.89]    Principal Problem:   Cellulitis of finger of right hand Active Problems:   Paroxysmal atrial fibrillation (HCC)   Coronary artery disease   Chronic systolic heart failure (HCC)   Hyperlipidemia   Hypertension   Dementia (HCC)   Anemia   Hyponatremia   Collapse with respiratory arrest on examination [facility])       Past Medical History:  Diagnosis Date  . CAD (coronary artery disease)    s/p NSTEMI 9/19. Cath in [place], [place] 10/19. Severe CAD not amenable to PCI. Not CABG candidate  . CHF (congestive heart failure) (HCC)    EF 40-45% by echo due to iCM. EF 36% by Myoview 9/19  . Dementia ([place])   . Essential tremor   . Hyperlipidemia    Intolerant of statins  . Hypertension   . PAF (paroxysmal atrial fibrillation) ([place])   . Prostate cancer [facility])     History reviewed. No pertinent surgical history.     HPI  from the history and physical done on the day of admission:   Chief Complaint: Finger trauma.  HPI: Tanner Ferguson is a 86 y.o. male with medical history significant of CAD, chronic systolic CHF, dementia, essential tremor, hyperlipidemia, hypertension, paroxysmal atrial fibrillation, prostate cancer who went to [place] due to worsening of his right hand injury that he sustained on Monday despite taking Augmentin.  He denies fever, chills or night sweats.  No anorexia, rhinorrhea, sore throat, dyspnea, wheezing or hemoptysis.  Denies chest pain, palpitations, dizziness, diaphoresis, PND, orthopnea or recent pitting  edema of the lower extremities.  No abdominal pain, nausea or vomiting, diarrhea or constipation, melena or hematochezia.  Denies dysuria, frequency or hematuria.  No polyuria, polydipsia, polyphagia or blurred vision.  ED Course: Initial vital signs temperature 98.1 F, pulse 78, respirations 16, blood pressure 143/83 mmHg and O2 sat 100% on room air.  The patient was given 600 mg of clindamycin.  His CBC showed a white count of 4.3, hemoglobin 11.3 g/dL and platelets 125.  BMP shows a sodium of 124, potassium 4.4, chloride 93 and CO2 22 mmol/L.  Glucose 89, BUN 30, creatinine 1.78 and calcium 8.5 mg/dL.  Right hand x-ray done on 10/18/2018 showed multifocal osteoarthritis, but no acute on normality.     Hospital Course:     Brief Summary 85 y.o.malewith  medical history significant ofCAD, chronic systolic CHF, dementia, essential tremor, hyperlipidemia, hypertension, paroxysmal atrial fibrillation, prostate cancer admitted on 10/20/2018 with right ring finger cellulitis, acute kidney injury and hyponatremia    A/p 1) acute on chronic hyponatremia--- Per available old records as per family patient's baseline sodium is usually between 128 and 131, admitted with a sodium of 124 sodium trending up to 128 -Serum osmolarity is 272 -Avoid excessive free water intake -Sodium is back to patient's baseline, patient feels better okay to discharge with repeat BMP with PCP within a week  2)AKI----acute kidney injury on CKD stage - III,  renal function improved with gentle IV hydration,,   creatinine on admission= 1.78,   baseline creatinine = 1.4, creatinine is now= 1.48     , renally adjust medications, avoid nephrotoxic agents / dehydration/hypotension -Renal function is back to baseline , repeat BMP with PCP within a week advised  3) right ring finger trauma with secondary cellulitis---  treated with IV clindamycin, much improved, please see before and after pictures in epic, continue clindamycin for additional 5 days -wound culture NGTD  4) chronic atrial fibrillation--continue amiodarone for rate control, continue Coumadin for anticoagulation   5) history of essential tremors-stable  Disposition--=discharge home-  Discussed with daughter-in-law  [name] [phone] 10/20/2018 and again on 10/21/2018 prior to discharge questions answered  Discharge Condition: stable  Follow UP--PCP within a week for recheck and for repeat BMP test  Diet and Activity recommendation:  As advised  Discharge Instructions    Discharge Instructions    Call MD for:  difficulty breathing, headache or visual disturbances   Complete by: As directed    Call MD for:  persistant dizziness or light-headedness   Complete by: As directed    Call MD for:   persistant nausea and vomiting   Complete by: As directed    Call MD for:  redness, tenderness, or signs of infection (pain, swelling, redness, odor or green/yellow discharge around incision site)   Complete by: As directed    Call MD for:  severe uncontrolled pain   Complete by: As directed    Call MD for:  temperature >100.4   Complete by: As directed    Diet - low sodium heart healthy   Complete by: As directed    Discharge instructions   Complete by: As directed    1) your sodium and your kidney function has improved--okay to repeat BMP/kidney and electrolyte blood test with the primary care physician in about a week or so 2)Please Avoid ibuprofen/Advil/Aleve/Motrin/Goody Powders/Naproxen/BC powders/Meloxicam/Diclofenac/Indomethacin and other Nonsteroidal anti-inflammatory medications as these will make you more likely to bleed and can cause stomach ulcers, can also cause Kidney problems.  3) avoid drinking too much free water (limits herself to no more than 20 ounces (623ml) of free water per day, limit your total fluid intake to no more than 60 ounces (1.8L) per day of the total of 60 ounces free water should not be more than 20 ounces 4) take clindamycin 300 mg 3 times a day for 5 days for right ring finger infection--- please call if you develop diarrhea, as you may need to be tested for C. difficile infection 5) see if stopping caffeinated drink may improve your tremors 6) please continue your Coumadin and recheck your PT/INR with your regular doctor in about a week or so   Increase activity slowly   Complete by: As directed         Discharge Medications     Allergies as of 10/21/2018      Reactions   Penicillins Rash      Medication List    STOP taking these medications   amoxicillin-clavulanate 875-125 MG tablet Commonly known as: AUGMENTIN     TAKE these medications   albuterol 108 (90 Base) MCG/ACT inhaler Commonly known as: VENTOLIN HFA Inhale 1-2 puffs into  the lungs every 4 (four) hours as needed for wheezing or shortness of breath.   amiodarone 200 MG tablet Commonly known as: Pacerone Take 0.5 tablets (100 mg total) by mouth daily. What changed: when to take this   aspirin EC 81 MG tablet Take 1 tablet (81 mg total) by mouth daily with breakfast. What changed: when to take this   clindamycin 300 MG capsule Commonly known as: Cleocin Take 1 capsule (300 mg total) by mouth 3 (three) times daily for 5 days.   rosuvastatin 20 MG tablet Commonly known as: CRESTOR Take 1 tablet (20 mg total) by mouth every evening.   Symbicort 80-4.5 MCG/ACT inhaler Generic drug: budesonide-formoterol Inhale 2 puffs into the lungs 2 (two) times a day.   tamsulosin 0.4 MG Caps capsule Commonly known as: FLOMAX Take 0.4 mg by mouth daily after supper.   warfarin 1 MG tablet Commonly known as: COUMADIN Take 1 mg by mouth See admin instructions. With the 2.5 mg for a total of 3.5 mg daily in the evenings   warfarin 2.5 MG tablet Commonly known as: COUMADIN Take 2.5 mg by mouth See admin instructions. With 1 mg for a total of 3.5 mg in the evenings       Major procedures and Radiology Reports - PLEASE review detailed and final reports for all details, in brief -   Dg Hand Complete Right  Result Date: 10/18/2018 CLINICAL DATA:  Crush injury of the right hand yesterday moving 2 chairs. Pain. Initial encounter. EXAM: RIGHT HAND - COMPLETE 3+ VIEW COMPARISON:  None. FINDINGS: No acute bony or joint abnormality is identified. Multifocal interphalangeal joint osteoarthritis appears worst at the IP joint of the thumb and DIP joints of the index and long fingers. Degenerative change is also present at the scaphoid trapezium trapezoid joint. Degenerative disease is also seen at the first through fourth MCP joints. There is chondrocalcinosis at these joints and in the triangular fibrocartilage. Atherosclerosis noted. IMPRESSION: No acute abnormality. Multifocal  osteoarthritis. Degenerative change about the first through fourth MCP joints may be due to CPPD arthropathy given chondrocalcinosis. Electronically Signed   By: Inge Rise M.D.   On: 10/18/2018 15:49    Micro Results    Recent Results (from the past 240 hour(s))  SARS CORONAVIRUS 2 Nasal Swab Aptima Multi Swab  Status: None   Collection Time: 10/19/18  7:58 PM   Specimen: Aptima Multi Swab; Nasal Swab  Result Value Ref Range Status   SARS Coronavirus 2 NEGATIVE NEGATIVE Final    Comment: (NOTE) SARS-CoV-2 target nucleic acids are NOT DETECTED. The SARS-CoV-2 RNA is generally detectable in upper and lower respiratory specimens during the acute phase of infection. Negative results do not preclude SARS-CoV-2 infection, do not rule out co-infections with other pathogens, and should not be used as the sole basis for treatment or other patient management decisions. Negative results must be combined with clinical observations, patient history, and epidemiological information. The expected result is Negative. Fact Sheet for Patients: SugarRoll.be Fact Sheet for Healthcare Providers: https://www.woods-mathews.com/ This test is not yet approved or cleared by the [place] FDA and  has been authorized for detection and/or diagnosis of SARS-CoV-2 by FDA under an Emergency Use Authorization (EUA). This EUA will remain  in effect (meaning this test can be used) for the duration of the COVID-19 declaration under Section 56 4(b)(1) of the Act, 21 U.S.C. section 360bbb-3(b)(1), unless the authorization is terminated or revoked sooner. Performed at [facility], [place] [address], [place], [place] [address]   Aerobic Culture (superficial specimen)     Status: None (Preliminary result)   Collection Time: 10/20/18  8:57 AM   Specimen: Wound  Result Value Ref Range Status   Specimen Description   Final    WOUND RIGHT FINGER Performed at  [place] [address] [place], [place] [address]    Special Requests   Final    NONE Performed at [facility], [place] [address], [place], [place] [address]    Gram Stain   Final    FEW WBC PRESENT, PREDOMINANTLY PMN RARE GRAM POSITIVE COCCI Performed at [facility], [place] [address] [place], [place] [address]    Culture PENDING  Incomplete   Report Status PENDING  Incomplete       Today   Subjective    Tanner Ferguson today has no new complaints,  --No fevers no chills, voiding well, eating and drinking well,          Patient has been seen and examined prior to discharge   Objective   Blood pressure (!) 122/59, pulse (!) 51, temperature 98.5 F (36.9 C), temperature source Oral, resp. rate 15, height 5\' 3"  (1.6 m), weight 70.3 kg, SpO2 97 %.   Intake/Output Summary (Last 24 hours) at 10/21/2018 1005 Last data filed at 10/21/2018 0552 Gross per 24 hour  Intake 1900.44 ml  Output 2325 ml  Net -424.56 ml    Exam Gen:- Awake Alert, no acute distress  HEENT:- .AT, No sclera icterus Neck-Supple Neck,No JVD,.  Lungs-  CTAB , good air movement bilaterally  CV- S1, S2 normal, regular, valve click Abd-  +ve B.Sounds, Abd Soft, No tenderness,    Extremity -No  edema,   good pulses Psych-affect is appropriate, oriented x3 Neuro-no new focal deficits, essential tremors, not new  MSK--right ring finger open wound and cellulitis appears to have improved significantly --Please see photos in epic  [place]    10/21/2018 08:41  Attached To:  Hospital Encounter on 10/19/18  Source Information  Roxan Hockey, MD  [facility]    Media Information    Document Information  Photos    10/21/2018 08:41  Attached To:  Hospital Encounter on 10/19/18  Source Information  Roxan Hockey, MD  [facility]       Data Review   CBC w Diff:  Lab  Results  Component Value Date   WBC 2.6 (L) 10/20/2018   HGB 10.3 (L) 10/20/2018   HCT 31.8 (L) 10/20/2018   PLT 108 (L) 10/20/2018   LYMPHOPCT 19 10/20/2018   MONOPCT 15 10/20/2018   EOSPCT 2 10/20/2018   BASOPCT 0 10/20/2018   CMP:  Lab Results  Component Value Date   NA 128 (L) 10/21/2018   K 4.8 10/21/2018   CL 101 10/21/2018   CO2 21 (L) 10/21/2018   BUN 24 (H) 10/21/2018   CREATININE 1.48 (H) 10/21/2018   PROT 7.3 10/20/2018   ALBUMIN 3.3 (L) 10/20/2018   BILITOT 0.4 10/20/2018   ALKPHOS 46 10/20/2018   AST 18 10/20/2018   ALT 12 10/20/2018  .   Total Discharge time is about 33 minutes  Roxan Hockey M.D on 10/21/2018 at 10:05 AM  Go to [email or web address] -  for contact info  [facility] - Office  [phone]

## 2018-10-21 NOTE — Progress Notes (Signed)
Tanner Ferguson for Warfarin Indication: atrial fibrillation  Allergies  Allergen Reactions  . Penicillins Rash    Patient Measurements: Height: 5\' 3"  (160 cm) Weight: 155 lb (70.3 kg) IBW/kg (Calculated) : 56.9   Vital Signs: Temp: 98.5 F (36.9 C) (08/22 0549) Temp Source: Oral (08/22 0549) BP: 122/59 (08/22 0549) Pulse Rate: 51 (08/22 0549)  Labs: Recent Labs    10/18/18 1500 10/19/18 1800 10/20/18 0240 10/20/18 1401 10/21/18 0232  HGB 11.6* 11.3* 10.3*  --   --   HCT 35.0* 35.0* 31.8*  --   --   PLT 123* 125* 108*  --   --   LABPROT  --   --   --  25.7* 28.0*  INR  --   --   --  2.4* 2.7*  CREATININE 2.14* 1.78* 1.48*  --  1.48*    Estimated Creatinine Clearance: 29.8 mL/min (A) (by C-G formula based on SCr of 1.48 mg/dL (H)).   Medications:  Scheduled:  . amiodarone  100 mg Oral QHS  . aspirin EC  81 mg Oral QHS  . mometasone-formoterol  2 puff Inhalation BID  . rosuvastatin  20 mg Oral Daily  . tamsulosin  0.4 mg Oral Daily  . Warfarin - Pharmacist Dosing Inpatient   Does not apply q1800   Infusions:  . sodium chloride 500 mL (10/19/18 2318)  . sodium chloride 75 mL/hr at 10/21/18 0234  . clindamycin (CLEOCIN) IV 600 mg (10/21/18 0531)    Assessment: 71 yoM admitted with finger wound. On chronic warfarin for a-fib; Pharmacy to continue dosing inpatient.  Prior anticoagulation: warfarin 3.5 mg daily; LD 10/19/18.  INR 2.4  Significant events:  Today, 10/21/2018:  INR 2.7, remains therapeutic but increased  CBC: Hgb/Plt low but stable (last on 8/21)  Major drug interactions: None noted.  Clindamycin (started 8/21) is low risk; Amiodarone and Crestor are chronic meds.  No bleeding issues per nursing  Eating 75% of meals  Goal of Therapy: INR 2-3  Plan:  Warfarin 2.5 mg PO tonight at 18:00  Daily INR  CBC at least q72 hr while on warfarin  Monitor for signs of bleeding or thrombosis   Gretta Arab PharmD, BCPS Clinical pharmacist phone 7am- 5pm: [phone] 10/21/2018 7:30 AM

## 2018-10-21 NOTE — Discharge Instructions (Signed)
Living With Heart Failure  Heart failure is a long-term (chronic) condition in which the heart cannot pump enough blood through the body. When this happens, parts of the body do not get the blood and oxygen they need. There is no cure for heart failure at this time, so it is important for you to take good care of yourself and follow the treatment plan set by your health care provider. If you are living with heart failure, there are ways to help you manage the disease. Follow these instructions at home: Living with heart failure requires you to make changes in your life. Your health care team will teach you about the changes you need to make in order to relieve your symptoms and lower your risk of going to the hospital. Follow the treatment plan as set by your health care provider. Medicines Medicines are important in reducing your heart's workload, slowing the progression of heart failure, and improving your symptoms.  Take over-the-counter and prescription medicines only as told by your health care provider.  Do not stop taking your medicine unless your health care provider tells you to do that.  Do not skip any dose of your medicine.  Refill prescriptions before you run out of medicine. You need your medicines every day. Eating and drinking   Eat heart-healthy foods. Talk with a dietitian to make an eating plan that is right for you. ? If directed by your health care provider: ? Limit salt (sodium). Lowering your sodium intake may reduce symptoms of heart failure. Ask a dietitian to recommend heart-healthy seasonings. ? Limit your fluid intake. Fluid restriction may reduce symptoms of heart failure. ? Use low-fat cooking methods instead of frying. Low-fat methods include roasting, grilling, broiling, baking, poaching, steaming, and stir-frying. ? Choose foods that contain no trans fat and are low in saturated fat and cholesterol. Healthy choices include fresh or frozen fruits and vegetables,  fish, lean meats, legumes, fat-free or low-fat dairy products, and whole-grain or high-fiber foods.  Limit alcohol intake to no more than 1 drink a day for nonpregnant women and 2 drinks a day for men. One drink equals 12 oz of beer, 5 oz of wine, or 1 oz of hard liquor. ? Drinking more than that is harmful to your heart. Tell your health care provider if you drink alcohol several times a week. ? Talk with your health care provider about whether any level of alcohol use is safe for you. Activity   Ask your health care provider about attending cardiac rehabilitation. These programs include aerobic physical activity, which provides many benefits for your heart.  If no cardiac rehabilitation program is available, ask your health care provider what aerobic exercises are safe for you to do. Lifestyle Make the lifestyle changes recommended by your health care provider. In general:  Lose weight if your health care provider tells you to do that. Weight loss may reduce symptoms of heart failure.  Do not use any products that contain nicotine or tobacco, such as cigarettes or e-cigarettes. If you need help quitting, ask your health care provider.  Do not use street (illegal) drugs.  Return to your normal activities as told by your health care provider. Ask your health care provider what activities are safe for you. General instructions   Make sure you weigh yourself every day to track your weight. Rapid weight gain may indicate an increase in fluid in your body and may increase the workload of your heart. ? Weigh yourself every morning.  Do this after you urinate but before you eat breakfast. ? Wear the same type of clothing, without shoes, each time you weigh yourself. ? Weigh yourself on the same scale and in the same spot each time.  Living with chronic heart failure often leads to emotions such as fear, stress, anxiety, and depression. If you feel any of these emotions and need help coping,  contact your health care provider. Other ways to get help include: ? Talking to friends and family members about your condition. They can give you support and guidance. Explain your symptoms to them and, if comfortable, invite them to attend appointments or rehabilitation with you. ? Joining a support group for people with chronic heart failure. Talking with other people who have the same symptoms may give you new ways of coping with your disease and your emotions.  Stay up to date with your shots (vaccines). Staying current on pneumococcal and influenza vaccines is especially important in preventing germs from attacking your airways (respiratory infections).  Keep all follow-up visits as told by your health care provider. This is important. How to recognize changes in your condition You and your family members need to know what changes to watch for in your condition. Watch for the following changes and report them to your health care provider:  Sudden weight gain. Ask your health care provider what amount of weight gain to report.  Shortness of breath: ? Feeling short of breath while at rest, with no exercise or activity that required great effort. ? Feeling breathless with activity.  Swelling of your lower legs or ankles.  Difficulty sleeping: ? You wake up feeling short of breath. ? You have to use more pillows to raise your head in order to sleep.  Frequent, dry, hacking cough.  Loss of appetite.  Feeling more tired all the time.  Depression or feelings of sadness or hopelessness.  Bloating in the stomach. Where to find more information  Local support groups. Ask your health care provider about groups near you.  The American Heart Association: www.heart.org Contact a health care provider if:  You have a rapid weight gain.  You have increasing shortness of breath that is unusual for you.  You are unable to participate in your usual physical activities.  You tire  easily.  You cough more than normal, especially with physical activity.  You have any swelling or more swelling in areas such as your hands, feet, ankles, or abdomen.  You feel like your heart is beating quickly (palpitations).  You become dizzy or light-headed when you stand up. Get help right away if:  You have difficulty breathing.  You notice or your family notices a change in your awareness, such as having trouble staying awake or having difficulty with concentration.  You have pain or discomfort in your chest.  You have an episode of fainting (syncope). Summary  There is no cure for heart failure, so it is important for you to take good care of yourself and follow the treatment plan set by your health care provider.  Medicines are important in reducing your heart's workload, slowing the progression of heart failure, and improving your symptoms.  Living with chronic heart failure often leads to emotions such as fear, stress, anxiety, and depression. If you are feeling any of these emotions and need help coping, contact your health care provider. This information is not intended to replace advice given to you by your health care provider. Make sure you discuss any questions you  have with your health care provider. Document Released: 06/30/2016 Document Revised: 01/28/2017 Document Reviewed: 06/30/2016 Elsevier Patient Education  2020 [place]. Heart Failure and Exercise Heart failure is a condition in which the heart does not fill or pump enough blood and oxygen to support your body and its functions. Heart failure is a long-term (chronic) condition. Living with heart failure can be challenging. However, following your health care provider's instructions about a healthy lifestyle may help improve your symptoms. This includes choosing the right exercise plan. Doing daily physical activity is important after a diagnosis of heart failure. You may have some activity restrictions,  so talk to your health care provider before doing any exercises. What are the benefits of exercise? Exercise may:  Make your heart muscles stronger.  Lower your blood pressure.  Lower your cholesterol.  Help you lose weight.  Help your bones stay strong.  Improve your blood circulation.  Help your body use oxygen better. This relieves symptoms such as fatigue and shortness of breath.  Help your mental health by lowering the risk of depression and other problems.  Improve your quality of life.  Decrease your chance of hospital admission for heart failure. What is an exercise plan? An exercise plan is a set of specific exercises and training activities. You will work with your health care provider to create the exercise plan that works for you. The plan may include:  Different types of exercises and how to do them.  Cardiac rehabilitation exercises. These are supervised programs that are designed to strengthen your heart. What are strengthening exercises? Strengthening exercises are a type of physical activity that involves using resistance to improve your muscle strength. Strengthening exercises usually have repetitive motions. These types of exercises can include:  Lifting weights.  Using weight machines.  Using resistance tubes and bands.  Using kettlebells.  Using your body weight, such as doing push-ups or squats. What are balance exercises? Balance exercises are another type of physical activity. They strengthen the muscles of the back, stomach, and pelvis (core muscles) and improve your balance. They can also lower your risk of falling. These types of exercises can include:  Standing on one leg.  Walking backward, sideways, and in a straight line.  Standing up after sitting, without using your hands.  Shifting your weight from one leg to the other.  Lifting one leg in front of you.  Doing tai chi. This is a type of exercise that uses slow movements and deep  breathing. How can I increase my flexibility? Having better flexibility can keep you from falling. It can also lengthen your muscles, improve your range of motion, and help your joints. You can increase your flexibility by:  Doing tai chi.  Doing yoga.  Stretching. How much aerobic exercise should I get?  Aerobic exercises strengthen your breathing and circulation system and increase your body's use of oxygen. Examples of aerobic exercise include biking, walking, running, and swimming. Talk to your health care provider to find out how much aerobic exercise is safe for you.  To do these exercises:  Start exercising slowly, limiting the amount of time at first. You may need to start with 5 minutes of aerobic exercise every day.  Slowly add more minutes until you can safely do at least 30 minutes of exercise at least 4 days a week. Summary  Daily physical activity is important after a diagnosis of heart failure.  Exercise can make your heart muscles stronger. It also offers other benefits that  will improve your health.  Talk to your health care provider before doing any exercises. This information is not intended to replace advice given to you by your health care provider. Make sure you discuss any questions you have with your health care provider. Document Released: 06/29/2016 Document Revised: 07/02/2016 Document Reviewed: 06/29/2016 Elsevier Patient Education  2020 [place]. Heart Failure, Self Care Heart failure is a serious condition. This document explains the things you need to do to take care of yourself after a heart failure diagnosis. You may be asked to change your diet, take certain medicines, and make other lifestyle changes in order to stay as healthy as possible. Your health care provider may also give you more specific instructions. If you have problems or questions, contact your health care provider. What are the risks? Having heart failure puts you at higher risk for  certain problems. These problems can get worse if you do not take good care of yourself. Problems may include:  Blood clotting problems. This may cause a stroke.  Damage to the kidneys, liver, or lungs.  Abnormal heart rhythms. Supplies needed:  Scale for monitoring weight.  Blood pressure monitor.  Notebook.  Medicines. How to care for yourself when you have heart failure Medicines Take over-the-counter and prescription medicines only as told by your health care provider. Medicines reduce the workload of your heart, slow the progression of heart failure, and improve symptoms. Take your medicines every day.  Do not stop taking your medicine unless your health care provider tells you to do so.  Do not skip any dose of medicine.  Refill your prescriptions before you run out of medicine. Eating and drinking   Eat heart-healthy foods. Talk with a dietitian to make an eating plan that is right for you. ? Choose foods that contain no trans fat and are low in saturated fat and cholesterol. Healthy choices include fresh or frozen fruits and vegetables, fish, lean meats, legumes, fat-free or low-fat dairy products, and whole-grain or high-fiber foods. ? Limit salt (sodium) if told by your health care provider. Sodium restriction may reduce symptoms of heart failure. Ask a dietitian to recommend heart-healthy seasonings. ? Use healthy cooking methods instead of frying. Healthy methods include roasting, grilling, broiling, baking, poaching, steaming, and stir-frying.  Limit your fluid intake, if directed by your health care provider. Fluid restriction may reduce symptoms of heart failure. Alcohol use  Do not drink alcohol if: ? Your health care provider tells you not to drink. ? Your heart was damaged by alcohol, or you have severe heart failure. ? You are pregnant, may be pregnant, or are planning to become pregnant.  If you drink alcohol: ? Limit how much you use to:  0-1 drink a  day for women.  0-2 drinks a day for men. ? Be aware of how much alcohol is in your drink. In the [place] one drink equals one 12 oz bottle of beer (355 mL), one 5 oz glass of wine (148 mL), or one 1 oz glass of hard liquor (44 mL). Lifestyle   Do not use any products that contain nicotine or tobacco, such as cigarettes, e-cigarettes, and chewing tobacco. If you need help quitting, ask your health care provider. ? Do not use nicotine gum or patches before talking to your health care provider.  Do not use illegal drugs.  Work with your health care provider to safely reach the right body weight.  Do physical activity if told by your health care provider.  Talk to your health care provider before you begin an exercise if: ? You are an older adult. ? You have severe heart failure.  Learn to manage stress. If you need help to do this, ask your health care provider.  Participate in or seek rehabilitation as needed to keep or improve your independence and quality of life.  Plan rest periods when you get tired. Monitoring important information   Weigh yourself every day. This will help you to notice if too much fluid is building up in your body. ? Weigh yourself every morning after you urinate and before you eat breakfast. ? Wear the same amount of clothing each time you weigh yourself. ? Record your daily weight. Provide your health care provider with your weight record.  Monitor and record your pulse and blood pressure as told by your health care provider. Dealing with extreme temperatures  If the weather is extremely hot: ? Avoid vigorous physical activity. ? Use air conditioning or fans, or find a cooler location. ? Avoid caffeine and alcohol. ? Wear loose-fitting, lightweight, and light-colored clothing.  If the weather is extremely cold: ? Avoid vigorous activity. ? Layer your clothes. ? Wear mittens or gloves, a hat, and a scarf when you go outside. ? Avoid alcohol. Follow  these instructions at home:  Stay up to date with vaccines. Pneumococcal and flu (influenza) vaccines are especially important in preventing infections of the airways.  Keep all follow-up visits as told by your health care provider. This is important. Contact a health care provider if you:  Have a rapid weight gain.  Have increasing shortness of breath.  Are unable to participate in your usual physical activities.  Get tired easily.  Cough more than normal, especially with physical activity.  Lose your appetite or feel nauseous.  Have any swelling or more swelling in areas such as your hands, feet, ankles, or abdomen.  Are unable to sleep because it is hard to breathe.  Feel like your heart is beating quickly (palpitations).  Become dizzy or light-headed when you stand up. Get help right away if you:  Have trouble breathing.  Notice or your family notices a change in your awareness, such as having trouble staying awake or concentrating.  Have pain or discomfort in your chest.  Have an episode of fainting (syncope). These symptoms may represent a serious problem that is an emergency. Do not wait to see if the symptoms will go away. Get medical help right away. Call your local emergency services (911 in the [place] Do not drive yourself to the hospital. Summary  Heart failure is a serious condition. To care for yourself, you may be asked to change your diet, take certain medicines, and make other lifestyle changes.  Take your medicines every day. Do not stop taking them unless your health care provider tells you to do so.  Eat heart-healthy foods, such as fresh or frozen fruits and vegetables, fish, lean meats, legumes, fat-free or low-fat dairy products, and whole-grain or high-fiber foods.  Ask your health care provider if you have any alcohol restrictions. You may have to stop drinking alcohol if you have severe heart failure.  Contact your health care provider if you  notice problems, such as rapid weight gain or a fast heartbeat. Get help right away if you faint, or have chest pain or trouble breathing. This information is not intended to replace advice given to you by your health care provider. Make sure you discuss any questions you have  with your health care provider. Document Released: 05/31/2018 Document Revised: 05/30/2018 Document Reviewed: 05/31/2018 Elsevier Patient Education  2020 [employer]. 1) your sodium and your kidney function has improved--okay to repeat BMP/kidney and electrolyte blood test with the primary care physician in about a week or so 2)Please Avoid ibuprofen/Advil/Aleve/Motrin/Goody Powders/Naproxen/BC powders/Meloxicam/Diclofenac/Indomethacin and other Nonsteroidal anti-inflammatory medications as these will make you more likely to bleed and can cause stomach ulcers, can also cause Kidney problems.  3) avoid drinking too much free water (limits herself to no more than 20 ounces (628m) of free water per day, limit your total fluid intake to no more than 60 ounces (1.8L) per day of the total of 60 ounces free water should not be more than 20 ounces 4) take clindamycin 300 mg 3 times a day for 5 days for right ring finger infection--- please call if you develop diarrhea, as you may need to be tested for C. difficile infection 5) see if stopping caffeinated drink may improve your tremors 6) please continue your Coumadin and recheck your PT/INR with your regular doctor in about a week or so

## 2018-10-22 LAB — AEROBIC CULTURE W GRAM STAIN (SUPERFICIAL SPECIMEN)

## 2018-10-26 DIAGNOSIS — N183 Chronic kidney disease, stage 3 (moderate): Secondary | ICD-10-CM | POA: Diagnosis not present

## 2018-10-26 DIAGNOSIS — I10 Essential (primary) hypertension: Secondary | ICD-10-CM | POA: Diagnosis not present

## 2018-10-26 DIAGNOSIS — D61818 Other pancytopenia: Secondary | ICD-10-CM | POA: Diagnosis not present

## 2018-10-26 DIAGNOSIS — D696 Thrombocytopenia, unspecified: Secondary | ICD-10-CM | POA: Diagnosis not present

## 2018-10-26 DIAGNOSIS — Z23 Encounter for immunization: Secondary | ICD-10-CM | POA: Diagnosis not present

## 2018-10-26 DIAGNOSIS — L0390 Cellulitis, unspecified: Secondary | ICD-10-CM | POA: Diagnosis not present

## 2018-10-26 DIAGNOSIS — E871 Hypo-osmolality and hyponatremia: Secondary | ICD-10-CM | POA: Diagnosis not present

## 2018-10-30 ENCOUNTER — Telehealth: Payer: Self-pay | Admitting: Hematology and Oncology

## 2018-10-30 NOTE — Telephone Encounter (Signed)
Received a new patient referral from Dr. Lysle Rubens for pancytopenia and low platelets. I cld and spoke to the pt's daughter in law, Tanner Ferguson and scheduled Tanner Ferguson to see Dr. Lindi Adie on 9/10 at 2:45pm. Aware to arrive 15 minutes early.

## 2018-10-31 ENCOUNTER — Other Ambulatory Visit (HOSPITAL_COMMUNITY): Payer: Medicare Other

## 2018-11-03 DIAGNOSIS — I5020 Unspecified systolic (congestive) heart failure: Secondary | ICD-10-CM | POA: Diagnosis not present

## 2018-11-03 DIAGNOSIS — N183 Chronic kidney disease, stage 3 (moderate): Secondary | ICD-10-CM | POA: Diagnosis not present

## 2018-11-03 DIAGNOSIS — E871 Hypo-osmolality and hyponatremia: Secondary | ICD-10-CM | POA: Diagnosis not present

## 2018-11-03 DIAGNOSIS — I480 Paroxysmal atrial fibrillation: Secondary | ICD-10-CM | POA: Diagnosis not present

## 2018-11-03 DIAGNOSIS — I129 Hypertensive chronic kidney disease with stage 1 through stage 4 chronic kidney disease, or unspecified chronic kidney disease: Secondary | ICD-10-CM | POA: Diagnosis not present

## 2018-11-03 DIAGNOSIS — I2510 Atherosclerotic heart disease of native coronary artery without angina pectoris: Secondary | ICD-10-CM | POA: Diagnosis not present

## 2018-11-08 ENCOUNTER — Ambulatory Visit (HOSPITAL_COMMUNITY): Payer: Medicare Other | Attending: Cardiology

## 2018-11-08 ENCOUNTER — Other Ambulatory Visit: Payer: Self-pay

## 2018-11-08 DIAGNOSIS — I255 Ischemic cardiomyopathy: Secondary | ICD-10-CM | POA: Insufficient documentation

## 2018-11-08 DIAGNOSIS — R0609 Other forms of dyspnea: Secondary | ICD-10-CM | POA: Diagnosis not present

## 2018-11-08 MED ORDER — PERFLUTREN LIPID MICROSPHERE
1.0000 mL | INTRAVENOUS | Status: AC | PRN
  Administered 2018-11-08: 1 mL via INTRAVENOUS

## 2018-11-10 ENCOUNTER — Telehealth (INDEPENDENT_AMBULATORY_CARE_PROVIDER_SITE_OTHER): Payer: Medicare Other | Admitting: Cardiology

## 2018-11-10 ENCOUNTER — Encounter: Payer: Self-pay | Admitting: Cardiology

## 2018-11-10 VITALS — Ht 60.0 in | Wt 157.0 lb

## 2018-11-10 DIAGNOSIS — Z7901 Long term (current) use of anticoagulants: Secondary | ICD-10-CM

## 2018-11-10 DIAGNOSIS — I2510 Atherosclerotic heart disease of native coronary artery without angina pectoris: Secondary | ICD-10-CM | POA: Diagnosis not present

## 2018-11-10 DIAGNOSIS — R0609 Other forms of dyspnea: Secondary | ICD-10-CM

## 2018-11-10 DIAGNOSIS — I480 Paroxysmal atrial fibrillation: Secondary | ICD-10-CM | POA: Diagnosis not present

## 2018-11-10 DIAGNOSIS — Z952 Presence of prosthetic heart valve: Secondary | ICD-10-CM | POA: Diagnosis not present

## 2018-11-10 DIAGNOSIS — I255 Ischemic cardiomyopathy: Secondary | ICD-10-CM

## 2018-11-10 DIAGNOSIS — I2584 Coronary atherosclerosis due to calcified coronary lesion: Secondary | ICD-10-CM

## 2018-11-10 NOTE — Patient Instructions (Addendum)
Medication Instructions:  Your Physician recommend you continue on your current medication as directed.    If you need a refill on your cardiac medications before your next appointment, please call your pharmacy.   Lab work: None  Testing/Procedures: None  Follow-Up: At [facility], you and your health needs are our priority.  As part of our continuing mission to provide you with exceptional heart care, we have created designated Provider Care Teams.  These Care Teams include your primary Cardiologist (physician) and Advanced Practice Providers (APPs -  Physician Assistants and Nurse Practitioners) who all work together to provide you with the care you need, when you need it. You will need a follow up appointment in 6 weeks.  You may see Buford Dresser, MD or one of the following Advanced Practice Providers on your designated Care Team:   Rosaria Ferries, PA-C . Jory Sims, DNP, ANP

## 2018-11-10 NOTE — Progress Notes (Signed)
Virtual Visit via Video Note   This visit type was conducted due to national recommendations for restrictions regarding the COVID-19 Pandemic (e.g. social distancing) in an effort to limit this patient's exposure and mitigate transmission in our community.  Due to his co-morbid illnesses, this patient is at least at moderate risk for complications without adequate follow up.  This format is felt to be most appropriate for this patient at this time.  All issues noted in this document were discussed and addressed.  A limited physical exam was performed with this format.  Please refer to the patient's chart for his consent to telehealth for [facility].   Date:  11/10/2018   ID:  [name], DOB [id number], MRN [id number]  Patient Location: Home Provider Location: Home  PCP:  Wenda Low, MD  Cardiologist:  Buford Dresser, MD  Electrophysiologist:  None   Evaluation Performed:  Follow-Up Visit  Chief Complaint:  Shortness of breath  History of Present Illness:    Tanner Ferguson is a 85 y.o. male with hx of aortic stenosis s/p mechanical AVR (1995), CAD with MI 11/2017, ischemic cardiomyopathy with prior reduced EF, now normalized, paroxysmal atrial fibrillation, obstructive sleep apnea, hypertension, hyperlipidemia, reported COPD and asbestos exposure. We have been following closely for wheezing/shortness of breath.  The patient has tested negative for COVID during his recent symptoms.  Today: Recently discharged 10/21/18 for had cellulitis, with AKI and hyponatremia. Patient's son and daughter and law on the phone, reviewed multiple concerns today.  Discussed echo results at length: Echo results:  1. The left ventricle has normal systolic function with an ejection fraction of 60-65%. The cavity size was normal. There is mildly increased left ventricular wall thickness with severe basal septal hypertrophy. There is apical and apical anterior  akinesis.  2. The right  ventricle has normal systolic function. The cavity was mildly enlarged. There is no increase in right ventricular wall thickness.  3. A Mechanical AVR valve is present in the aortic position. Procedure Date: 1995 Normal aortic valve prosthesis. The prosthesis is not well visualized. The mean AVG is 80mmHg. There is trivial perivalvular AI.  4. There is mild to moderate mitral annular calcification present and mild MR.  5. There is mild dilatation of the ascending aorta measuring 40 mm.  6. Right atrial size was mildly dilated.  7. Left atrial size was moderately dilated.  Notable: IVC not dilated, has respiratory variability. RAP normal, RVSP normal. Average E/e'<15, not suggestive of elevated LV filling pressures.   We spent a significant amount of time discussing what this means in terms of his shortness of breath. His echo is remarkably (and honestly, surprisingly) normal. Mechanical valve normal, EF normal, diastolic function not significantly abnormal, normal filling pressures. With this, I have a low suspicion that his shortness of breath is cardiac in origin. He cannot do a CPX test right now given his activity tolerance.   They have also seen pulmonology and have an upcoming appt with hematology on Monday. Even with his pulmonary disease and chronic anemia, his symptoms seem out of proportion.   We spent time talking about deconditioning. I think home PT would be a good option, but in the age of coronavirus he is high risk for exposure. We talked about best ways to potentially work on conditioning at home. He has both a recumbent exercise bike and will soon have his treadmill back. Even a few minutes per day on these may help to improve his conditioning. If he  feels better, then gradual exercise training might be the best answer.   BP at home 123XX123 systolic, [id number] is the highest. Was elevated at the time of his echo, which is not normal for him, but had heavily exerted himself to get  upstairs to echo lab. His ARB was held with recent AKI. I had a message from his nephrologist, Dr. Johnney Ou. My thought had been to retrial ARB, which Dr. Johnney Ou approved, but with a normalized EF, well controlled BP, and recent AKI, I will wait for now to see how he does before restarting.   Had some pitting edema on the right leg last week at nephrology, new last week, has been coming and going.   Prior workup: Dr. Matilde Bash notes from 09/28/18 visit. My interpretation of the plan is for continued monitoring of the asbestosis given mild fibrosis, pleural plaques, and minimal restriction/diffusion defects on PFTs. Does not think imaging is typical for amiodarone toxicity, ok to continue. Possibly has mild asthma, planned for allergy referral. Started on AutoPAP for sleep apnea.   Most recent PFTs 2020: Patient gave best possible effort.. The results of this test meets ATS standards, although the DLCO received error code IVC < 90%. HHN given with 2.5mg  of Albuterol Hgb default value of 14.6 used. The FVC is reduced, but the FEV1/FVC ratio is increased. Patient effort was erratic, but the normal FEV1 indicates no significant obstruction. The TLC and SVC are reduced, but the FRC is normal. Following administration of bronchodilators, there is no significant response. The reduced diffusing capacity indicates a minimal loss of functional alveolar capillary surface. However, the diffusing capacity was not corrected for the patient's hemoglobin.  Conclusions: The reduced lung volumes, increased FEV1/FVC ratio and diffusion defect suggest an interstitial process such as fibrosis or interstitial inflammation. Anemia cannot be excluded as a potential cause of the diffusion defect without  correcting the observed diffusing capacity for hemoglobin.    Pulmonary Function Diagnosis:   Mild Restriction -Interstitial   Minimal Diffusion Defect  PFT: (10/16/2014) Spirometry showed an FVC of 91% of predicted, FEV1 of  102% of  predicted, and an FVC/FEV1 ratio of  75% No significant bronchodilator responsiveness was seen  Total lung capacity is Low at 68% of predicted  Diffusion capacity is normal at 102% of predicted.   IMPRESSION:  There is no obstructive . There is mild restrictive lung defect  is seen. There was insignificant response to bronchodilators.  Lung volumes revealed mild restriction . Diffusion capacity is  normal. Clinical correlation is reccommended   CT chest on 07/30/2014: There is moderate cardiomegaly without pericardial effusion. Extensive multivessel coronary artery calcification is identified. The tracheobronchial tree is widely patent. The esophagus is minimally distended. Evaluation of the pulmonary parenchyma is negative for focal airspace disease or consolidation. No spiculated pulmonary nodule or mass is detected. There is no pleural effusion.  There are a few calcified pleural plaques at the diaphragmatic surface of the right lung base. Similarly a very few calcified pleural plaques in the left lung base at the interface with the diaphragmatic surface. Further attention to the lung bases  demonstrates subtle peripheral subpleural lines. There is no overt bronchiectasis or honeycombing.  Sleep Study - This unattended sleep study is positive for clinically significant sleep disordered breathing. Obstructive and central sleep apnea. (12/13/2012)  Past Medical History:  Diagnosis Date  . CAD (coronary artery disease)    s/p NSTEMI 9/19. Cath in [place], [place] 10/19. Severe CAD not amenable to PCI. Not CABG  candidate  . CHF (congestive heart failure) (HCC)    EF 40-45% by echo due to iCM. EF 36% by Myoview 9/19  . Dementia ([place])   . Essential tremor   . Hyperlipidemia    Intolerant of statins  . Hypertension   . PAF (paroxysmal atrial fibrillation) ([place])   . Prostate cancer ([place])    No past surgical history on file.   Current Meds  Medication Sig  . albuterol  (VENTOLIN HFA) 108 (90 Base) MCG/ACT inhaler Inhale 1-2 puffs into the lungs every 4 (four) hours as needed for wheezing or shortness of breath.   Marland Kitchen amiodarone (PACERONE) 200 MG tablet Take 0.5 tablets (100 mg total) by mouth daily. (Patient taking differently: Take 100 mg by mouth every evening. )  . aspirin EC 81 MG tablet Take 1 tablet (81 mg total) by mouth daily with breakfast.  . phenytoin (DILANTIN) 100 MG ER capsule Take 100 mg by mouth daily.  . rosuvastatin (CRESTOR) 20 MG tablet Take 1 tablet (20 mg total) by mouth every evening.  . SYMBICORT 80-4.5 MCG/ACT inhaler Inhale 2 puffs into the lungs 2 (two) times a day.   . tamsulosin (FLOMAX) 0.4 MG CAPS capsule Take 0.4 mg by mouth daily after supper.   . warfarin (COUMADIN) 1 MG tablet Take 1 mg by mouth See admin instructions. With the 2.5 mg for a total of 3.5 mg daily in the evenings  . warfarin (COUMADIN) 2.5 MG tablet Take 2.5 mg by mouth See admin instructions. With 1 mg for a total of 3.5 mg in the evenings     Allergies:   Penicillins   Social History   Tobacco Use  . Smoking status: Never Smoker  . Smokeless tobacco: Former Systems developer    Types: Chew  Substance Use Topics  . Alcohol use: Not Currently  . Drug use: Not on file     Family Hx: No FH of premature CAD  ROS:   Please see the history of present illness.    Constitutional: Negative for chills, fever, night sweats, unintentional weight loss  HENT: Negative for ear pain and hearing loss.   Eyes: Negative for loss of vision and eye pain.  Respiratory: Positive for dyspnea on exertion, mild cough/sputum/wheezing   Cardiovascular: See HPI. Gastrointestinal: Negative for abdominal pain, melena, and hematochezia.  Genitourinary: Negative for dysuria and hematuria.  Musculoskeletal: Negative for falls and myalgias.  Skin: Negative for itching and rash.  Neurological: Negative for focal weakness, focal sensory changes and loss of consciousness.  Endo/Heme/Allergies:  Does bruise/bleed easily.    Prior CV studies:   The following studies were reviewed today: See HPI. Reviewed recent echo at length  Labs/Other Tests and Data Reviewed:    EKG:  An ECG dated 03/09/18 was personally reviewed today and demonstrated:  sinus brady, 1st degree AV block, IVCD, nonspecific ST changes  Recent Labs: 01/25/2018: B Natriuretic Peptide 328.3 10/20/2018: ALT 12; Hemoglobin 10.3; Platelets 108 10/21/2018: BUN 24; Creatinine, Ser 1.48; Potassium 4.8; Sodium 128   Recent Lipid Panel No results found for: CHOL, TRIG, HDL, CHOLHDL, LDLCALC, LDLDIRECT  Wt Readings from Last 3 Encounters:  11/10/18 157 lb (71.2 kg)  10/19/18 155 lb (70.3 kg)  10/02/18 152 lb (68.9 kg)     Objective:    Vital Signs:  Ht 5' (1.524 m)   Wt 157 lb (71.2 kg)   BMI 30.66 kg/m    VITAL SIGNS:  reviewed GEN:  no acute distress EYES:  sclerae anicteric,  EOMI - Extraocular Movements Intact RESPIRATORY:  normal respiratory effort, symmetric expansion CARDIOVASCULAR:  no JVD visualized on phone SKIN:  no rash, lesions or ulcers. MUSCULOSKELETAL:  no obvious deformities. NEURO:  resting tremor PSYCH:  normal affect  ASSESSMENT & PLAN:    Shortness of breath, wheezing: See prior notes for extensive discussion -recent echo remarkably normal. Small area of scar noted, but normal global EF, normal filling pressures (normal IVC, normal RVSP, E/e' <15), normal mechanical aortic valve gradient -symptoms primarily exertional -has been evaluated by pulm (see HPI) and pending hematology appt, but symptoms are out of proportion to anemia and pulm findings -with recent echo results, unlikely cardiac in origin -cannot CPX at this time -will work on very gradual conditioning to see if symptoms improve  Paroxysmal atrial fibrillation -very symptomatic when in afib RVR, required 2 cardioversions in the fall -coumadin monitored by Dr. Glenna Durand office. Considering other options for this monitoring  -CHA2DS2/VAS Stroke Risk Points=4, but irrelevant as he needs to be on coumadin for his mechanical valve. -continue amiodarone  Prior chronic systolic heart failure, 2/2 ischemic cardiomyopathy. EF 40-45% before, now normalized on recent echo -euvolemic -no beta blocker due to bradycardia -have tried ACEi and ARB. Now on hold 2/2 recent AKI. With borderline BP and normalized EF, will hold for now on restarting, revisit at next appt -given baseline elevated potassium, no room for spironolactone  Discussed minimally or no change today:  CAD s/p NSTEMI, with diffuse severe disease seen on cath -continue aspirin -continuerosuvastatin -resting sinus bradycardia. No room for beta blocker -medical management  History of mechanical AVR, on coumadin -continue coumadin and aspirin -SBE prophylaxis with dental procedures  COVID-19 Education: The signs and symptoms of COVID-19 were discussed with the patient and how to seek care for testing (follow up with PCP or arrange E-visit).  The importance of social distancing was discussed today.  Time:   Today, I have spent 47 minutes with the patient with telehealth technology discussing the above problems.  Extensive video conversation with patient, son, and daughter in law.  Patient Instructions  Medication Instructions:  Your Physician recommend you continue on your current medication as directed.    If you need a refill on your cardiac medications before your next appointment, please call your pharmacy.   Lab work: None  Testing/Procedures: None  Follow-Up: At [facility], you and your health needs are our priority.  As part of our continuing mission to provide you with exceptional heart care, we have created designated Provider Care Teams.  These Care Teams include your primary Cardiologist (physician) and Advanced Practice Providers (APPs -  Physician Assistants and Nurse Practitioners) who all work together to provide you with the  care you need, when you need it. You will need a follow up appointment in 6 weeks.  You may see Buford Dresser, MD or one of the following Advanced Practice Providers on your designated Care Team:   Rosaria Ferries, PA-C . Jory Sims, DNP, ANP       Medication Adjustments/Labs and Tests Ordered: Current medicines are reviewed at length with the patient today.  Concerns regarding medicines are outlined above.   Tests Ordered: No orders of the defined types were placed in this encounter.  Medication Changes: No orders of the defined types were placed in this encounter.  Follow Up:  6 weeks, virtual  Signed, Buford Dresser, MD  11/10/2018 11:48 AM    [place]

## 2018-11-12 NOTE — Progress Notes (Signed)
[place] CONSULT NOTE  Patient Care Team: Wenda Low, MD as PCP - General (Internal Medicine) Buford Dresser, MD as PCP - Cardiology (Cardiology)  CHIEF COMPLAINTS/PURPOSE OF CONSULTATION:  Newly diagnosed anemia, thrombocytopenia, and neutropenia  HISTORY OF PRESENTING ILLNESS:  Tanner Ferguson 85 y.o. male is here because of recent diagnosis of anemia, thrombocytopenia, and neutropenia. He is referred by his PCP, Dr. Lysle Rubens. He was admitted from 8/20-8/22 after presenting to the ED for an infection in his right index finger. Labs on 10/20/18 showed: Hg 10.3, HCT 31.8, MCV 94.9, platelets 108, WBC 2.6, ANC 1.6, creatinine 1.38. He presents to the clinic today for initial evaluation and discussion of treatment options.  He is here today accompanied by his daughter-in-law who is a Immunologist. I reviewed his records extensively and collaborated the history with the patient.  MEDICAL HISTORY:  Past Medical History:  Diagnosis Date  . CAD (coronary artery disease)    s/p NSTEMI 9/19. Cath in [place], [place] 10/19. Severe CAD not amenable to PCI. Not CABG candidate  . CHF (congestive heart failure) (HCC)    EF 40-45% by echo due to iCM. EF 36% by Myoview 9/19  . Dementia ([place])   . Essential tremor   . Hyperlipidemia    Intolerant of statins  . Hypertension   . PAF (paroxysmal atrial fibrillation) ([place])   . Prostate cancer [facility])     SURGICAL HISTORY: No past surgical history on file.  SOCIAL HISTORY: Social History   Socioeconomic History  . Marital status: Widowed    Spouse name: Not on file  . Number of children: Not on file  . Years of education: Not on file  . Highest education level: Not on file  Occupational History  . Not on file  Social Needs  . Financial resource strain: Not on file  . Food insecurity    Worry: Not on file    Inability: Not on file  . Transportation needs    Medical: Not on file    Non-medical: Not on file  Tobacco Use  .  Smoking status: Never Smoker  . Smokeless tobacco: Former Systems developer    Types: Chew  Substance and Sexual Activity  . Alcohol use: Not Currently  . Drug use: Not on file  . Sexual activity: Not on file  Lifestyle  . Physical activity    Days per week: Not on file    Minutes per session: Not on file  . Stress: Not on file  Relationships  . Social Herbalist on phone: Not on file    Gets together: Not on file    Attends religious service: Not on file    Active member of club or organization: Not on file    Attends meetings of clubs or organizations: Not on file    Relationship status: Not on file  . Intimate partner violence    Fear of current or ex partner: Not on file    Emotionally abused: Not on file    Physically abused: Not on file    Forced sexual activity: Not on file  Other Topics Concern  . Not on file  Social History Narrative  . Not on file    FAMILY HISTORY: Family History  Family history unknown: Yes    ALLERGIES:  is allergic to penicillins.  MEDICATIONS:  Current Outpatient Medications  Medication Sig Dispense Refill  . albuterol (VENTOLIN HFA) 108 (90 Base) MCG/ACT inhaler Inhale 1-2 puffs into the lungs every  4 (four) hours as needed for wheezing or shortness of breath.     Marland Kitchen amiodarone (PACERONE) 200 MG tablet Take 0.5 tablets (100 mg total) by mouth daily. (Patient taking differently: Take 100 mg by mouth every evening. ) 45 tablet 3  . aspirin EC 81 MG tablet Take 1 tablet (81 mg total) by mouth daily with breakfast. 30 tablet 1  . phenytoin (DILANTIN) 100 MG ER capsule Take 100 mg by mouth daily.    . rosuvastatin (CRESTOR) 20 MG tablet Take 1 tablet (20 mg total) by mouth every evening. 30 tablet 1  . SYMBICORT 80-4.5 MCG/ACT inhaler Inhale 2 puffs into the lungs 2 (two) times a day.     . tamsulosin (FLOMAX) 0.4 MG CAPS capsule Take 0.4 mg by mouth daily after supper.     . warfarin (COUMADIN) 1 MG tablet Take 1 mg by mouth See admin  instructions. With the 2.5 mg for a total of 3.5 mg daily in the evenings    . warfarin (COUMADIN) 2.5 MG tablet Take 2.5 mg by mouth See admin instructions. With 1 mg for a total of 3.5 mg in the evenings     No current facility-administered medications for this visit.     REVIEW OF SYSTEMS:   Constitutional: Denies fevers, chills or abnormal night sweats Eyes: Denies blurriness of vision, double vision or watery eyes Ears, nose, mouth, throat, and face: Denies mucositis or sore throat Respiratory: Denies cough, dyspnea or wheezes Cardiovascular: Denies palpitation, chest discomfort or lower extremity swelling Gastrointestinal:  Denies nausea, heartburn or change in bowel habits Skin: Denies abnormal skin rashes Lymphatics: Denies new lymphadenopathy or easy bruising Neurological: Tremors Behavioral/Psych: Mood is stable, no new changes  All other systems were reviewed with the patient and are negative.  PHYSICAL EXAMINATION: ECOG PERFORMANCE STATUS: 2 - Symptomatic, <50% confined to bed  Vitals:   11/13/18 1458  BP: (!) 151/68  Pulse: (!) 59  Resp: 17  Temp: 98.5 F (36.9 C)  SpO2: 100%   Filed Weights   11/13/18 1458  Weight: 157 lb 11.2 oz (71.5 kg)    GENERAL:alert, no distress and comfortable SKIN: skin color, texture, turgor are normal, no rashes or significant lesions EYES: normal, conjunctiva are pink and non-injected, sclera clear OROPHARYNX:no exudate, no erythema and lips, buccal mucosa, and tongue normal  NECK: supple, thyroid normal size, non-tender, without nodularity LYMPH:  no palpable lymphadenopathy in the cervical, axillary or inguinal LUNGS: clear to auscultation and percussion with normal breathing effort HEART: regular rate & rhythm and no murmurs and no lower extremity edema ABDOMEN:abdomen soft, non-tender and normal bowel sounds Musculoskeletal:no cyanosis of digits and no clubbing  PSYCH: alert & oriented x 3 with fluent speech NEURO: no focal  motor/sensory deficits  LABORATORY DATA:  I have reviewed the data as listed Lab Results  Component Value Date   WBC 2.9 (L) 11/13/2018   HGB 11.4 (L) 11/13/2018   HCT 35.6 (L) 11/13/2018   MCV 93.7 11/13/2018   PLT 111 (L) 11/13/2018   Lab Results  Component Value Date   NA 128 (L) 10/21/2018   K 4.8 10/21/2018   CL 101 10/21/2018   CO2 21 (L) 10/21/2018    RADIOGRAPHIC STUDIES: I have personally reviewed the radiological reports and agreed with the findings in the report.  ASSESSMENT AND PLAN:  Other pancytopenia ([place]) Lab Review:  10/20/18: WBC: 2.6, Hb 10.3, Pl: 103 (range 108-135), ANC 1.6 Hospitalization: 10/19/18: Acute Kidney Injury, cellulitis: required Antibiotics.  DD: 1. Infection/ inflammation related 2. Bone marrow suppression/ dysfunction  Clinical exam does not show any splenomegaly or hepatomegaly. He has not facial aortic valve. He also has chronic kidney disease.  I discussed with him that the thrombocytopenia is not significant and does not require any work-up or treatment. The degree of anemia is generally mild and could be attributed to anemia due to illness/chronic kidney disease. The leukopenia especially the neutropenia could be attributed to his recent infection.  Plan:  11/13/2018: Labs: WBC 2.9, ANC 1.8, hemoglobin 11.4, platelets 111 I discussed with the patient's daughter-in-law the results of this test which indicate that he appears to be recovering from the recent illness. I do not intend to do a bone marrow biopsy at his age and his counts.  Based on his lab results, I can see him on an as-needed basis.  He has an appointment in December to his primary care physician.  If at that time his labs look significantly abnormal we are happy to see him as well.    All questions were answered. The patient knows to call the clinic with any problems, questions or concerns.   Rulon Eisenmenger, MD 11/13/2018    I, Molly Dorshimer, am acting as  scribe for Nicholas Lose, MD.  I have reviewed the above documentation for accuracy and completeness, and I agree with the above.

## 2018-11-13 ENCOUNTER — Inpatient Hospital Stay: Payer: Medicare Other

## 2018-11-13 ENCOUNTER — Other Ambulatory Visit: Payer: Self-pay

## 2018-11-13 ENCOUNTER — Inpatient Hospital Stay: Payer: Medicare Other | Attending: Hematology and Oncology | Admitting: Hematology and Oncology

## 2018-11-13 DIAGNOSIS — Z8546 Personal history of malignant neoplasm of prostate: Secondary | ICD-10-CM | POA: Insufficient documentation

## 2018-11-13 DIAGNOSIS — I255 Ischemic cardiomyopathy: Secondary | ICD-10-CM | POA: Diagnosis not present

## 2018-11-13 DIAGNOSIS — Z7982 Long term (current) use of aspirin: Secondary | ICD-10-CM | POA: Diagnosis not present

## 2018-11-13 DIAGNOSIS — F0390 Unspecified dementia without behavioral disturbance: Secondary | ICD-10-CM | POA: Diagnosis not present

## 2018-11-13 DIAGNOSIS — I4821 Permanent atrial fibrillation: Secondary | ICD-10-CM | POA: Diagnosis not present

## 2018-11-13 DIAGNOSIS — I509 Heart failure, unspecified: Secondary | ICD-10-CM | POA: Insufficient documentation

## 2018-11-13 DIAGNOSIS — I480 Paroxysmal atrial fibrillation: Secondary | ICD-10-CM | POA: Insufficient documentation

## 2018-11-13 DIAGNOSIS — Z7901 Long term (current) use of anticoagulants: Secondary | ICD-10-CM | POA: Diagnosis not present

## 2018-11-13 DIAGNOSIS — E785 Hyperlipidemia, unspecified: Secondary | ICD-10-CM | POA: Insufficient documentation

## 2018-11-13 DIAGNOSIS — D61818 Other pancytopenia: Secondary | ICD-10-CM | POA: Diagnosis not present

## 2018-11-13 DIAGNOSIS — I252 Old myocardial infarction: Secondary | ICD-10-CM | POA: Diagnosis not present

## 2018-11-13 DIAGNOSIS — I2510 Atherosclerotic heart disease of native coronary artery without angina pectoris: Secondary | ICD-10-CM | POA: Diagnosis not present

## 2018-11-13 DIAGNOSIS — I10 Essential (primary) hypertension: Secondary | ICD-10-CM | POA: Insufficient documentation

## 2018-11-13 DIAGNOSIS — D696 Thrombocytopenia, unspecified: Secondary | ICD-10-CM | POA: Insufficient documentation

## 2018-11-13 DIAGNOSIS — Z79899 Other long term (current) drug therapy: Secondary | ICD-10-CM | POA: Insufficient documentation

## 2018-11-13 LAB — CBC WITH DIFFERENTIAL (CANCER CENTER ONLY)
Abs Immature Granulocytes: 0 10*3/uL (ref 0.00–0.07)
Basophils Absolute: 0 10*3/uL (ref 0.0–0.1)
Basophils Relative: 0 %
Eosinophils Absolute: 0.1 10*3/uL (ref 0.0–0.5)
Eosinophils Relative: 3 %
HCT: 35.6 % — ABNORMAL LOW (ref 39.0–52.0)
Hemoglobin: 11.4 g/dL — ABNORMAL LOW (ref 13.0–17.0)
Immature Granulocytes: 0 %
Lymphocytes Relative: 23 %
Lymphs Abs: 0.7 10*3/uL (ref 0.7–4.0)
MCH: 30 pg (ref 26.0–34.0)
MCHC: 32 g/dL (ref 30.0–36.0)
MCV: 93.7 fL (ref 80.0–100.0)
Monocytes Absolute: 0.4 10*3/uL (ref 0.1–1.0)
Monocytes Relative: 13 %
Neutro Abs: 1.8 10*3/uL (ref 1.7–7.7)
Neutrophils Relative %: 61 %
Platelet Count: 111 10*3/uL — ABNORMAL LOW (ref 150–400)
RBC: 3.8 MIL/uL — ABNORMAL LOW (ref 4.22–5.81)
RDW: 13.9 % (ref 11.5–15.5)
WBC Count: 2.9 10*3/uL — ABNORMAL LOW (ref 4.0–10.5)
nRBC: 0 % (ref 0.0–0.2)

## 2018-11-13 NOTE — Assessment & Plan Note (Addendum)
Lab Review:  10/20/18: WBC: 2.6, Hb 10.3, Pl: 103 (range 108-135), ANC 1.6 Hospitalization: 10/19/18: Acute Kidney Injury, cellulitis: required Antibiotics.  DD: 1. Infection related 2. Bone marrow suppression/ dysfunction  We discussed the different causes of pancytopenia and plan to

## 2018-11-21 ENCOUNTER — Other Ambulatory Visit: Payer: Self-pay | Admitting: Internal Medicine

## 2018-11-21 DIAGNOSIS — N183 Chronic kidney disease, stage 3 unspecified: Secondary | ICD-10-CM

## 2018-11-29 ENCOUNTER — Other Ambulatory Visit: Payer: Self-pay

## 2018-11-29 ENCOUNTER — Ambulatory Visit (INDEPENDENT_AMBULATORY_CARE_PROVIDER_SITE_OTHER): Payer: Medicare Other | Admitting: Pulmonary Disease

## 2018-11-29 ENCOUNTER — Other Ambulatory Visit: Payer: Self-pay | Admitting: Internal Medicine

## 2018-11-29 ENCOUNTER — Encounter: Payer: Self-pay | Admitting: Pulmonary Disease

## 2018-11-29 ENCOUNTER — Telehealth: Payer: Self-pay | Admitting: Pulmonary Disease

## 2018-11-29 VITALS — BP 126/80 | HR 60 | Temp 98.5°F | Ht 62.0 in | Wt 158.2 lb

## 2018-11-29 DIAGNOSIS — Z23 Encounter for immunization: Secondary | ICD-10-CM | POA: Diagnosis not present

## 2018-11-29 DIAGNOSIS — I255 Ischemic cardiomyopathy: Secondary | ICD-10-CM | POA: Diagnosis not present

## 2018-11-29 DIAGNOSIS — N183 Chronic kidney disease, stage 3 unspecified: Secondary | ICD-10-CM

## 2018-11-29 MED ORDER — SYMBICORT 80-4.5 MCG/ACT IN AERO: 2.0000 | INHALATION_SPRAY | Freq: Two times a day (BID) | RESPIRATORY_TRACT | 6 refills | Status: DC

## 2018-11-29 NOTE — Progress Notes (Signed)
Tanner Ferguson    [id number]    12-22-1927  Primary Care Physician:Husain, Denton Ar, MD  Referring Physician: Wenda Low, MD [place] [employer] [address],  [place] [address]  Chief complaint: Follow-up for asbestosis, OSA  HPI: 85 year old with history of atrial fibrillation, coronary artery disease, CHF, asbestos exposure in the past, aortic valve replacement, prostate cancer.  Complains of dyspnea with wheezing for the past several months.  He had been initially started on Pulmicort inhaler and then switched to Symbicort.  Treated with doxycycline and prednisone in June 2020 Reports improvement in symptoms.  States that breathing is much improved.  Denies any cough, sputum production.  Has chronic dyspnea on exertion PFTs show mild restriction and has been referred here for evaluation of interstitial lung disease.   Previously evaluated with [place] in 2016 with CT scan and pulmonary function test which showed pleural plaques, no pulmonary fibrosis and mild restriction.  Has history of MI in September 2019.  He started on amiodarone in October 2019 for atrial fibrillation.  He also has a mechanical aortic valve and is on chronic Coumadin anticoagulation.  Pets: No pets Occupation: Used to work in plumbing, heating and cooling.  Retired in 2014  Exposures, ILD questionnaire 09/05/2018: Exposure to asbestos.  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 No mold, hot tub, Jacuzzi  Smoking history: Never smoker Travel history: Lived in [place] for most of his life. Relevant family history: No significant family history of lung disease  Interim history: Breathing is doing well. Recently had an echocardiogram with normal LVEF.  He was told that he has deconditioning Started on exercise program on stationary bike with improvement in dyspnea.  Outpatient Encounter Medications as of 11/29/2018  Medication  Sig  . albuterol (VENTOLIN HFA) 108 (90 Base) MCG/ACT inhaler Inhale 1-2 puffs into the lungs every 4 (four) hours as needed for wheezing or shortness of breath.   Marland Kitchen amiodarone (PACERONE) 200 MG tablet Take 0.5 tablets (100 mg total) by mouth daily. (Patient taking differently: Take 100 mg by mouth every evening. )  . aspirin EC 81 MG tablet Take 1 tablet (81 mg total) by mouth daily with breakfast.  . phenytoin (DILANTIN) 100 MG ER capsule Take 100 mg by mouth daily.  . rosuvastatin (CRESTOR) 20 MG tablet Take 1 tablet (20 mg total) by mouth every evening.  . SYMBICORT 80-4.5 MCG/ACT inhaler Inhale 2 puffs into the lungs 2 (two) times a day.   . tamsulosin (FLOMAX) 0.4 MG CAPS capsule Take 0.4 mg by mouth daily after supper.   . warfarin (COUMADIN) 1 MG tablet Take 1 mg by mouth See admin instructions. With the 2.5 mg for a total of 3.5 mg daily in the evenings  . warfarin (COUMADIN) 2.5 MG tablet Take 2.5 mg by mouth See admin instructions. With 1 mg for a total of 3.5 mg in the evenings   No facility-administered encounter medications on file as of 11/29/2018.    Physical Exam: Blood pressure 126/80, pulse 60, temperature 98.5 F (36.9 C), temperature source Temporal, height 5\' 2"  (1.575 m), weight 158 lb 3.2 oz (71.8 kg), SpO2 98 %. Gen:      No acute distress HEENT:  EOMI, sclera anicteric Neck:     No masses; no thyromegaly Lungs:    Clear to auscultation bilaterally; normal respiratory effort CV:         Regular rate and  rhythm; no murmurs Abd:      + bowel sounds; soft, non-tender; no palpable masses, no distension Ext:    No edema; adequate peripheral perfusion Skin:      Warm and dry; no rash Neuro: alert and oriented x 3 Psych: normal mood and affect  Data Reviewed: Imaging: CT chest 07/30/2014-calcified pleural plaques with no clear evidence of fibrosis, cardiomegaly.  CT high-resolution 09/08/2018- calcified pleural plaques.  Mild bibasal pulmonary fibrosis with no traction  bronchiectasis or honeycombing.  Probable UIP pattern.  I reviewed the images personally.  PFTs: 01/11/2015 ([place]) -FVC 91%, FEV 102%, F/F 75%. Lung volumes show mild restriction.  Diffusion capacity is normal.  08/09/2018 FVC 1.94 (76%), FEV1 1.50 [89%), F/F 77, TLC 4.06 [71%], DLCO 13.62 [72%], DLCO/VA 109% Minimal restriction and minimal diffusion defect which corrects alveolar volume.  Labs: CBC 09/05/2018-WBC 3.3, eos 5.2%, absolute eosinophil count 1716 Blood allergy profile 09/05/2018- IgE 62, RAST panel negative  Sleep [facility])  09/13/2013- positive for clinically significant sleep disordered breathing.  Cardiac Echo 11/04/2017-EF 40-45%, concentric LVH, mild chronic aortic valve with trivial AR. Echo 11/08/2018- EF 60-65%.  Normal RV systolic function.  Mechanical aortic valve in place.  Assessment:  Asbestosis Patient has history of asbestos exposure.  CT is reviewed with very mild fibrosis and pleural plaques He has minimal restriction and diffusion defect on lung function tests  No treatment needed at present.  We will continue to monitor this with annual CT scan and PFTs There is a concern for amiodarone toxicity but the imaging is not typical.  Do not feel we need to stop amiodarone. Per cardiology discussion he does not have very good alternatives in terms of antiarrhythmics  Mild asthma Likely has mild asthma, reactive airway disease Continue Symbicort  Sleep apnea Sleep study with mild apnea Start on AutoSet CPAP.  Download reviewed.  Discussed with patient that he will need to use it for more hours every day  Health maintenance Flu and Pneumovax today.  Plan/Recommendations: - Continue Symbicort - AutoSet CPAP 5-15.  Compliance encouraged.  Marshell Garfinkel MD [facility] 11/29/2018, 2:34 PM  CC: Wenda Low, MD

## 2018-11-29 NOTE — Telephone Encounter (Signed)
Called spoke with [place]. She states they were in office today to see dr. Vaughan Browner. She states pharmacy still have the symbicort for 2 puff once a day. According to the medication list and patient instructions patient is to take symbicort 2 puffs twice a day. Order sent to pharmacy nothing further needed.

## 2018-11-30 ENCOUNTER — Ambulatory Visit (HOSPITAL_BASED_OUTPATIENT_CLINIC_OR_DEPARTMENT_OTHER): Payer: Medicare Other

## 2018-11-30 ENCOUNTER — Other Ambulatory Visit: Payer: Medicare Other

## 2018-12-01 ENCOUNTER — Ambulatory Visit
Admission: RE | Admit: 2018-12-01 | Discharge: 2018-12-01 | Disposition: A | Discharge status: Home / Self Care | Payer: Medicare Other | Source: Ambulatory Visit | Attending: Internal Medicine | Admitting: Internal Medicine

## 2018-12-01 ENCOUNTER — Other Ambulatory Visit: Payer: Medicare Other

## 2018-12-01 DIAGNOSIS — N183 Chronic kidney disease, stage 3 unspecified: Secondary | ICD-10-CM

## 2018-12-01 DIAGNOSIS — N189 Chronic kidney disease, unspecified: Secondary | ICD-10-CM | POA: Diagnosis not present

## 2018-12-11 DIAGNOSIS — I4821 Permanent atrial fibrillation: Secondary | ICD-10-CM | POA: Diagnosis not present

## 2018-12-11 DIAGNOSIS — Z7901 Long term (current) use of anticoagulants: Secondary | ICD-10-CM | POA: Diagnosis not present

## 2018-12-22 ENCOUNTER — Telehealth (INDEPENDENT_AMBULATORY_CARE_PROVIDER_SITE_OTHER): Payer: Medicare Other | Admitting: Cardiology

## 2018-12-22 VITALS — BP 119/64

## 2018-12-22 DIAGNOSIS — Z7189 Other specified counseling: Secondary | ICD-10-CM

## 2018-12-22 DIAGNOSIS — I255 Ischemic cardiomyopathy: Secondary | ICD-10-CM

## 2018-12-22 DIAGNOSIS — I2510 Atherosclerotic heart disease of native coronary artery without angina pectoris: Secondary | ICD-10-CM | POA: Diagnosis not present

## 2018-12-22 DIAGNOSIS — Z952 Presence of prosthetic heart valve: Secondary | ICD-10-CM

## 2018-12-22 DIAGNOSIS — I480 Paroxysmal atrial fibrillation: Secondary | ICD-10-CM

## 2018-12-22 DIAGNOSIS — I5022 Chronic systolic (congestive) heart failure: Secondary | ICD-10-CM | POA: Diagnosis not present

## 2018-12-22 DIAGNOSIS — I2584 Coronary atherosclerosis due to calcified coronary lesion: Secondary | ICD-10-CM

## 2018-12-22 NOTE — Progress Notes (Signed)
Virtual Visit via Video Note   This visit type was conducted due to national recommendations for restrictions regarding the COVID-19 Pandemic (e.g. social distancing) in an effort to limit this patient's exposure and mitigate transmission in our community.  Due to his co-morbid illnesses, this patient is at least at moderate risk for complications without adequate follow up.  This format is felt to be most appropriate for this patient at this time.  All issues noted in this document were discussed and addressed.  A limited physical exam was performed with this format.  Please refer to the patient's chart for his consent to telehealth for [facility].   Date:  12/22/2018   ID:  Tanner Ferguson, DOB Jul 12, 1927, MRN [id number]  Patient Location: Home Provider Location: Home  PCP:  Wenda Low, MD  Cardiologist:  Buford Dresser, MD  Electrophysiologist:  None   Evaluation Performed:  Follow-Up Visit  Chief Complaint:  Shortness of breath  History of Present Illness:    Tanner Ferguson is a 85 y.o. male with hx of aortic stenosis s/p mechanical AVR (1995), CAD with MI 11/2017, ischemic cardiomyopathy with prior reduced EF, now normalized, paroxysmal atrial fibrillation, obstructive sleep apnea, hypertension, hyperlipidemia, reported COPD and asbestos exposure. We have been following closely for wheezing/shortness of breath.  The patient has tested negative for COVID during his recent symptoms.  Today: Reviewed Dr. Matilde Bash note from 11/29/18, Dr. Geralyn Flash note from 11/13/18.  Patient and his son present today. Son has been traveling to [place] to deal with selling patient's house. Tanner Ferguson (daughter in Sports coach) has been managing day to day.  Symbicort is helping breathing a lot. Energy level is better, has been raking leaves every day without issues. Also had CPAP analyzed, was pulling off/not using the entire night. Since changing the way it was placed, he is doing much better with the  CPAP. Using nasal pillow, tolerating well.  BP today 119/64, has been running very well controlled.   Denies chest pain, shortness of breath at rest or with normal exertion. No PND, orthopnea, LE edema (resolved) or unexpected weight gain. No syncope or palpitations.  Past Medical History:  Diagnosis Date  . CAD (coronary artery disease)    s/p NSTEMI 9/19. Cath in [place], [place] 10/19. Severe CAD not amenable to PCI. Not CABG candidate  . CHF (congestive heart failure) (HCC)    EF 40-45% by echo due to iCM. EF 36% by Myoview 9/19  . Dementia ([place])   . Essential tremor   . Hyperlipidemia    Intolerant of statins  . Hypertension   . PAF (paroxysmal atrial fibrillation) ([place])   . Prostate cancer ([place])    No past surgical history on file.   Current Meds  Medication Sig  . albuterol (VENTOLIN HFA) 108 (90 Base) MCG/ACT inhaler Inhale 1-2 puffs into the lungs every 4 (four) hours as needed for wheezing or shortness of breath.   Marland Kitchen amiodarone (PACERONE) 200 MG tablet Take 0.5 tablets (100 mg total) by mouth daily. (Patient taking differently: Take 100 mg by mouth every evening. )  . aspirin EC 81 MG tablet Take 1 tablet (81 mg total) by mouth daily with breakfast.  . phenytoin (DILANTIN) 100 MG ER capsule Take 100 mg by mouth daily.  . rosuvastatin (CRESTOR) 20 MG tablet Take 1 tablet (20 mg total) by mouth every evening.  . SYMBICORT 80-4.5 MCG/ACT inhaler Inhale 2 puffs into the lungs 2 (two) times daily.  . tamsulosin (FLOMAX) 0.4 MG CAPS capsule  Take 0.4 mg by mouth daily after supper.   . warfarin (COUMADIN) 2.5 MG tablet Take 2.5 mg by mouth daily.      Allergies:   Penicillins   Social History   Tobacco Use  . Smoking status: Never Smoker  . Smokeless tobacco: Former Systems developer    Types: Chew  Substance Use Topics  . Alcohol use: Not Currently  . Drug use: Not on file     Family Hx: No FH of premature CAD  ROS:   Constitutional: Negative for chills, fever, night sweats,  unintentional weight loss  HENT: Negative for ear pain and hearing loss.   Eyes: Negative for loss of vision and eye pain.  Respiratory: Reports improving cough/SOB, no sputum, wheezing.   Cardiovascular: See HPI. Gastrointestinal: Negative for abdominal pain, melena, and hematochezia.  Genitourinary: Negative for dysuria and hematuria.  Musculoskeletal: Negative for falls and myalgias.  Skin: Negative for itching and rash.  Neurological: Negative for focal weakness, focal sensory changes and loss of consciousness.  Endo/Heme/Allergies: Does bruise/bleed easily.    Prior CV studies:   The following studies were reviewed today: See HPI. Reviewed recent echo at length  Labs/Other Tests and Data Reviewed:    EKG:  An ECG dated 03/09/18 was personally reviewed today and demonstrated:  sinus brady, 1st degree AV block, IVCD, nonspecific ST changes  Recent Labs: 01/25/2018: B Natriuretic Peptide 328.3 10/20/2018: ALT 12 10/21/2018: BUN 24; Creatinine, Ser 1.48; Potassium 4.8; Sodium 128 11/13/2018: Hemoglobin 11.4; Platelet Count 111   Recent Lipid Panel No results found for: CHOL, TRIG, HDL, CHOLHDL, LDLCALC, LDLDIRECT  Wt Readings from Last 3 Encounters:  11/29/18 158 lb 3.2 oz (71.8 kg)  11/13/18 157 lb 11.2 oz (71.5 kg)  11/10/18 157 lb (71.2 kg)     Objective:    Vital Signs:  BP 119/64    VITAL SIGNS:  reviewed GEN:  no acute distress EYES:  sclerae anicteric, EOMI - Extraocular Movements Intact RESPIRATORY:  normal respiratory effort, symmetric expansion CARDIOVASCULAR:  no JVD visualized on phone SKIN:  no rash, lesions or ulcers. MUSCULOSKELETAL:  no obvious deformities. NEURO:  resting tremor PSYCH:  normal affect  ASSESSMENT & PLAN:    Shortness of breath, wheezing: See prior notes for extensive discussion -recent echo remarkably normal. Small area of scar noted, but normal global EF, normal filling pressures (normal IVC, normal RVSP, E/e' <15), normal mechanical  aortic valve gradient -symptoms much improved on his current pulmonary regimen -with recent echo results, unlikely cardiac in origin -continue gradual conditioning  Paroxysmal atrial fibrillation -very symptomatic when in afib RVR, required 2 cardioversions in the fall -coumadin monitored by Dr. Glenna Durand office. Considering other options for this monitoring -CHA2DS2/VAS Stroke Risk Points=4, but irrelevant as he needs to be on coumadin for his mechanical valve. -continue amiodarone  Prior chronic systolic heart failure, 2/2 ischemic cardiomyopathy. EF 40-45% before, now normalized on recent echo -euvolemic -no beta blocker due to bradycardia -have tried ACEi and ARB. Now on hold 2/2 recent AKI. Continue to monitor -given baseline elevated potassium, no room for spironolactone  CAD s/p NSTEMI, with diffuse severe disease seen on cath -continue aspirin -continuerosuvastatin -resting sinus bradycardia. No room for beta blocker -medical management  History of mechanical AVR, on coumadin -continue coumadin and aspirin -SBE prophylaxis with dental procedures  COVID-19 Education: The signs and symptoms of COVID-19 were discussed with the patient and how to seek care for testing (follow up with PCP or arrange E-visit).  The importance of  social distancing was discussed today.  Time:   Today, I have spent 17 minutes with the patient with telehealth technology discussing the above problems.    Patient Instructions  Medication Instructions:  Your Physician recommend you continue on your current medication as directed.    *If you need a refill on your cardiac medications before your next appointment, please call your pharmacy*  Lab Work: None  Testing/Procedures: None  Follow-Up: At [facility], you and your health needs are our priority.  As part of our continuing mission to provide you with exceptional heart care, we have created designated Provider Care Teams.  These Care  Teams include your primary Cardiologist (physician) and Advanced Practice Providers (APPs -  Physician Assistants and Nurse Practitioners) who all work together to provide you with the care you need, when you need it.  Your next appointment:   3 months  The format for your next appointment:   Virtual Visit   Provider:   Buford Dresser, MD      Medication Adjustments/Labs and Tests Ordered: Current medicines are reviewed at length with the patient today.  Concerns regarding medicines are outlined above.   Tests Ordered: No orders of the defined types were placed in this encounter.  Medication Changes: No orders of the defined types were placed in this encounter.  Follow Up:  3 mos  Signed, Buford Dresser, MD  12/22/2018 8:10 PM    [facility]

## 2018-12-22 NOTE — Patient Instructions (Signed)
Medication Instructions:  Your Physician recommend you continue on your current medication as directed.    *If you need a refill on your cardiac medications before your next appointment, please call your pharmacy*  Lab Work: None  Testing/Procedures: None  Follow-Up: At [facility], you and your health needs are our priority.  As part of our continuing mission to provide you with exceptional heart care, we have created designated Provider Care Teams.  These Care Teams include your primary Cardiologist (physician) and Advanced Practice Providers (APPs -  Physician Assistants and Nurse Practitioners) who all work together to provide you with the care you need, when you need it.  Your next appointment:   3 months  The format for your next appointment:   Virtual Visit   Provider:   Buford Dresser, MD

## 2018-12-26 ENCOUNTER — Encounter: Payer: Self-pay | Admitting: Cardiology

## 2019-01-05 ENCOUNTER — Telehealth: Payer: Self-pay | Admitting: Pulmonary Disease

## 2019-01-05 NOTE — Telephone Encounter (Signed)
Call was returned to office and would like a call back

## 2019-01-05 NOTE — Telephone Encounter (Signed)
Called and spoke with pt's daughter Jaclyn Shaggy stating to her that the instructions for symb 160 was the same as symb 80 with doing 2 puffs bid. After stating that to [facility], she said to keep pt on the symb 80 as it is working well for pt. Nothing further needed.

## 2019-01-05 NOTE — Telephone Encounter (Signed)
LMTCB

## 2019-01-08 DIAGNOSIS — I4821 Permanent atrial fibrillation: Secondary | ICD-10-CM | POA: Diagnosis not present

## 2019-01-08 DIAGNOSIS — Z7901 Long term (current) use of anticoagulants: Secondary | ICD-10-CM | POA: Diagnosis not present

## 2019-02-05 DIAGNOSIS — Z7901 Long term (current) use of anticoagulants: Secondary | ICD-10-CM | POA: Diagnosis not present

## 2019-02-05 DIAGNOSIS — I4821 Permanent atrial fibrillation: Secondary | ICD-10-CM | POA: Diagnosis not present

## 2019-03-20 ENCOUNTER — Ambulatory Visit: Payer: Medicare Other

## 2019-03-20 ENCOUNTER — Ambulatory Visit: Payer: Medicare Other | Attending: Internal Medicine

## 2019-03-20 DIAGNOSIS — Z23 Encounter for immunization: Secondary | ICD-10-CM | POA: Diagnosis not present

## 2019-03-20 NOTE — Progress Notes (Signed)
   Covid-19 Vaccination Clinic  Name:  Tanner Ferguson    MRN: [id number] DOB: [birth year]  03/20/2019  Tanner Ferguson was observed post Covid-19 immunization for 15 minutes without incidence. He was provided with Vaccine Information Sheet and instruction to access the V-Safe system.   Tanner Ferguson was instructed to call 911 with any severe reactions post vaccine: Marland Kitchen Difficulty breathing  . Swelling of your face and throat  . A fast heartbeat  . A bad rash all over your body  . Dizziness and weakness    Immunizations Administered    Name Date Dose VIS Date Route   Pfizer COVID-19 Vaccine 03/20/2019 10:27 AM 0.3 mL 02/09/2019 Intramuscular   Manufacturer: [place]   Lot: [id number]   [place]: [id number]

## 2019-03-27 ENCOUNTER — Telehealth (INDEPENDENT_AMBULATORY_CARE_PROVIDER_SITE_OTHER): Payer: Medicare Other | Admitting: Cardiology

## 2019-03-27 ENCOUNTER — Encounter: Payer: Self-pay | Admitting: Cardiology

## 2019-03-27 VITALS — BP 163/72

## 2019-03-27 DIAGNOSIS — I255 Ischemic cardiomyopathy: Secondary | ICD-10-CM

## 2019-03-27 DIAGNOSIS — I480 Paroxysmal atrial fibrillation: Secondary | ICD-10-CM

## 2019-03-27 DIAGNOSIS — Z952 Presence of prosthetic heart valve: Secondary | ICD-10-CM

## 2019-03-27 DIAGNOSIS — I10 Essential (primary) hypertension: Secondary | ICD-10-CM

## 2019-03-27 DIAGNOSIS — I2510 Atherosclerotic heart disease of native coronary artery without angina pectoris: Secondary | ICD-10-CM | POA: Diagnosis not present

## 2019-03-27 DIAGNOSIS — Z7901 Long term (current) use of anticoagulants: Secondary | ICD-10-CM

## 2019-03-27 DIAGNOSIS — I2584 Coronary atherosclerosis due to calcified coronary lesion: Secondary | ICD-10-CM

## 2019-03-27 DIAGNOSIS — I5022 Chronic systolic (congestive) heart failure: Secondary | ICD-10-CM

## 2019-03-27 NOTE — Patient Instructions (Signed)
Medication Instructions:  Your Physician recommend you continue on your current medication as directed.    *If you need a refill on your cardiac medications before your next appointment, please call your pharmacy*  Lab Work: None  Testing/Procedures: None  Follow-Up: At [facility], you and your health needs are our priority.  As part of our continuing mission to provide you with exceptional heart care, we have created designated Provider Care Teams.  These Care Teams include your primary Cardiologist (physician) and Advanced Practice Providers (APPs -  Physician Assistants and Nurse Practitioners) who all work together to provide you with the care you need, when you need it.  Your next appointment:   3 month(s)  The format for your next appointment:   Virtual Visit   Provider:   Buford Dresser, MD

## 2019-03-27 NOTE — Progress Notes (Signed)
Virtual Visit via Video Note   This visit type was conducted due to national recommendations for restrictions regarding the COVID-19 Pandemic (e.g. social distancing) in an effort to limit this patient's exposure and mitigate transmission in our community.  Due to his co-morbid illnesses, this patient is at least at moderate risk for complications without adequate follow up.  This format is felt to be most appropriate for this patient at this time.  All issues noted in this document were discussed and addressed.  A limited physical exam was performed with this format.  Please refer to the patient's chart for his consent to telehealth for [facility].   Date:  03/27/2019   ID:  [name], DOB 11-16-1927, MRN [id number]  Patient Location: Home Provider Location: Home  PCP:  Wenda Low, MD  Cardiologist:  Buford Dresser, MD  Electrophysiologist:  None   Evaluation Performed:  Follow-Up Visit  Chief Complaint:  Shortness of breath  History of Present Illness:    Tanner Ferguson is a 86 y.o. male with hx of aortic stenosis s/p mechanical AVR (1995), CAD with MI 11/2017, ischemic cardiomyopathy with prior reduced EF, now normalized, paroxysmal atrial fibrillation, obstructive sleep apnea, hypertension, hyperlipidemia, reported COPD and asbestos exposure. We have been following closely for wheezing/shortness of breath.  The patient has tested negative for COVID during his recent symptoms.  Today: BP today 163/72. Has been normal otherwise recently, not sure why it is high today. Both son and daughter in law present for video and have been assisting him.  Is on coumadin through Dr. Glenna Durand office. Having issues getting filled through [facility], looking for alternative sources of coumadin but planning to continue with checks through Dr. Lysle Rubens.  Breathing is doing well. Had first Covid vaccine. Not walking as much outside, but wheezing is better, can walk around house. Doing well  on inhalers. His treadmill is in the basement, has recumbent bike, tries to do some activity daily. CPAP going well, using nasal pillow.   ROS positive for continued tremors. Trialing medications (dilantin) through Dr. Glenna Durand office.   Denies chest pain, shortness of breath at rest. No PND, orthopnea, LE edema or unexpected weight gain. No syncope or palpitations.  Past Medical History:  Diagnosis Date  . CAD (coronary artery disease)    s/p NSTEMI 9/19. Cath in [place], [place] 10/19. Severe CAD not amenable to PCI. Not CABG candidate  . CHF (congestive heart failure) (HCC)    EF 40-45% by echo due to iCM. EF 36% by Myoview 9/19  . Dementia ([place])   . Essential tremor   . Hyperlipidemia    Intolerant of statins  . Hypertension   . PAF (paroxysmal atrial fibrillation) ([place])   . Prostate cancer ([place])    No past surgical history on file.   Current Meds  Medication Sig  . albuterol (VENTOLIN HFA) 108 (90 Base) MCG/ACT inhaler Inhale 1-2 puffs into the lungs every 4 (four) hours as needed for wheezing or shortness of breath.   Marland Kitchen amiodarone (PACERONE) 200 MG tablet Take 100 mg by mouth every evening.  Marland Kitchen aspirin EC 81 MG tablet Take 1 tablet (81 mg total) by mouth daily with breakfast.  . rosuvastatin (CRESTOR) 20 MG tablet Take 1 tablet (20 mg total) by mouth every evening.  . SYMBICORT 80-4.5 MCG/ACT inhaler Inhale 2 puffs into the lungs 2 (two) times daily.  . tamsulosin (FLOMAX) 0.4 MG CAPS capsule Take 0.4 mg by mouth daily after supper.   . warfarin (COUMADIN)  3 MG tablet Take 3 mg by mouth daily.  . [DISCONTINUED] amiodarone (PACERONE) 200 MG tablet Take 0.5 tablets (100 mg total) by mouth daily. (Patient taking differently: Take 100 mg by mouth every evening. )  . [DISCONTINUED] phenytoin (DILANTIN) 100 MG ER capsule Take 100 mg by mouth daily.     Allergies:   Penicillins   Social History   Tobacco Use  . Smoking status: Never Smoker  . Smokeless tobacco: Former Systems developer     Types: Chew  Substance Use Topics  . Alcohol use: Not Currently  . Drug use: Not on file     Family Hx: No FH of premature CAD  ROS:   ROS negative except as noted in HPI   Prior CV studies:   The following studies were reviewed today: No new since last visit  Labs/Other Tests and Data Reviewed:    EKG:  An ECG dated 03/09/18 was personally reviewed today and demonstrated:  sinus brady, 1st degree AV block, IVCD, nonspecific ST changes  Recent Labs: 10/20/2018: ALT 12 10/21/2018: BUN 24; Creatinine, Ser 1.48; Potassium 4.8; Sodium 128 11/13/2018: Hemoglobin 11.4; Platelet Count 111   Recent Lipid Panel No results found for: CHOL, TRIG, HDL, CHOLHDL, LDLCALC, LDLDIRECT  Wt Readings from Last 3 Encounters:  11/29/18 158 lb 3.2 oz (71.8 kg)  11/13/18 157 lb 11.2 oz (71.5 kg)  11/10/18 157 lb (71.2 kg)     Objective:    Vital Signs:  BP (!) 163/72    VITAL SIGNS:  reviewed GEN:  no acute distress EYES:  sclerae anicteric, EOMI - Extraocular Movements Intact RESPIRATORY:  normal respiratory effort, symmetric expansion CARDIOVASCULAR:  no visible JVD SKIN:  no rash, lesions or ulcers. MUSCULOSKELETAL:  no obvious deformities. NEURO:  alert and oriented x 3, no obvious focal deficit PSYCH:  normal affect  ASSESSMENT & PLAN:    Shortness of breath, wheezing: improving with inhalers, increased exercise -recent echo remarkably normal. Small area of scar noted, but normal global EF, normal filling pressures (normal IVC, normal RVSP, E/e' <15), normal mechanical aortic valve gradient. Suggests not a cardiac etiology -symptoms much improved on his current pulmonary regimen and with gradual conditioning  Paroxysmal atrial fibrillation -very symptomatic when in afib RVR, required 2 cardioversions 2019 -coumadin monitored by Dr. Glenna Durand office.  -CHA2DS2/VAS Stroke Risk Points=4, but irrelevant as he needs to be on coumadin for his mechanical valve. -continue amiodarone. See  prior extensive discussions regarding this.  Prior chronic systolic heart failure, 2/2 ischemic cardiomyopathy. EF 40-45% before, now normalized on most recent echo -euvolemic -no beta blocker due to bradycardia -have tried ACEi and ARB. Now on hold 2/2 prior AKI. Continue to monitor -given baseline elevated potassium, no room for spironolactone  CAD s/p NSTEMI, with diffuse severe disease seen on cath -continue aspirin -continuerosuvastatin -resting sinus bradycardia. No room for beta blocker -medical management  History of mechanical AVR, on coumadin -continue coumadin and aspirin -SBE prophylaxis with dental procedures  COVID-19 Education: The signs and symptoms of COVID-19 were discussed with the patient and how to seek care for testing (follow up with PCP or arrange E-visit).  The importance of social distancing was discussed today.  Time:   Today, I have spent 17 minutes face to face with the patient with telehealth technology discussing the above problems.  Total time including chart review and documentation 27 minutes.  Patient Instructions  Medication Instructions:  Your Physician recommend you continue on your current medication as directed.    *  If you need a refill on your cardiac medications before your next appointment, please call your pharmacy*  Lab Work: None  Testing/Procedures: None  Follow-Up: At [facility], you and your health needs are our priority.  As part of our continuing mission to provide you with exceptional heart care, we have created designated Provider Care Teams.  These Care Teams include your primary Cardiologist (physician) and Advanced Practice Providers (APPs -  Physician Assistants and Nurse Practitioners) who all work together to provide you with the care you need, when you need it.  Your next appointment:   3 month(s)  The format for your next appointment:   Virtual Visit   Provider:   Buford Dresser, MD    Follow  Up:  3 mos  Signed, Buford Dresser, MD  03/27/2019 8:15 PM    [facility]

## 2019-04-02 ENCOUNTER — Encounter: Payer: Self-pay | Admitting: Hematology and Oncology

## 2019-04-02 DIAGNOSIS — I4821 Permanent atrial fibrillation: Secondary | ICD-10-CM | POA: Diagnosis not present

## 2019-04-02 DIAGNOSIS — Z7901 Long term (current) use of anticoagulants: Secondary | ICD-10-CM | POA: Diagnosis not present

## 2019-04-09 ENCOUNTER — Ambulatory Visit: Payer: Medicare Other | Attending: Internal Medicine

## 2019-04-09 DIAGNOSIS — Z23 Encounter for immunization: Secondary | ICD-10-CM | POA: Insufficient documentation

## 2019-04-09 NOTE — Progress Notes (Signed)
   Covid-19 Vaccination Clinic  Name:  Tanner Ferguson    MRN: [id number] DOB: 08/28/1927  04/09/2019  Tanner Ferguson was observed post Covid-19 immunization for 15 minutes without incidence. He was provided with Vaccine Information Sheet and instruction to access the V-Safe system.   Tanner Ferguson was instructed to call 911 with any severe reactions post vaccine: Marland Kitchen Difficulty breathing  . Swelling of your face and throat  . A fast heartbeat  . A bad rash all over your body  . Dizziness and weakness    Immunizations Administered    Name Date Dose VIS Date Route   Pfizer COVID-19 Vaccine 04/09/2019 10:24 AM 0.3 mL 02/09/2019 Intramuscular   Manufacturer: [place]   Lot: [id number]   [place]: [id number]

## 2019-04-24 ENCOUNTER — Other Ambulatory Visit: Payer: Self-pay | Admitting: Cardiology

## 2019-04-30 DIAGNOSIS — I4821 Permanent atrial fibrillation: Secondary | ICD-10-CM | POA: Diagnosis not present

## 2019-04-30 DIAGNOSIS — Z7901 Long term (current) use of anticoagulants: Secondary | ICD-10-CM | POA: Diagnosis not present

## 2019-05-18 DIAGNOSIS — H5203 Hypermetropia, bilateral: Secondary | ICD-10-CM | POA: Diagnosis not present

## 2019-05-18 DIAGNOSIS — H04123 Dry eye syndrome of bilateral lacrimal glands: Secondary | ICD-10-CM | POA: Diagnosis not present

## 2019-05-28 DIAGNOSIS — Z7901 Long term (current) use of anticoagulants: Secondary | ICD-10-CM | POA: Diagnosis not present

## 2019-05-28 DIAGNOSIS — I4821 Permanent atrial fibrillation: Secondary | ICD-10-CM | POA: Diagnosis not present

## 2019-06-05 DIAGNOSIS — D61818 Other pancytopenia: Secondary | ICD-10-CM | POA: Diagnosis not present

## 2019-06-05 DIAGNOSIS — J0100 Acute maxillary sinusitis, unspecified: Secondary | ICD-10-CM | POA: Diagnosis not present

## 2019-06-19 DIAGNOSIS — I129 Hypertensive chronic kidney disease with stage 1 through stage 4 chronic kidney disease, or unspecified chronic kidney disease: Secondary | ICD-10-CM | POA: Diagnosis not present

## 2019-06-19 DIAGNOSIS — E871 Hypo-osmolality and hyponatremia: Secondary | ICD-10-CM | POA: Diagnosis not present

## 2019-06-19 DIAGNOSIS — R32 Unspecified urinary incontinence: Secondary | ICD-10-CM | POA: Diagnosis not present

## 2019-06-19 DIAGNOSIS — I5020 Unspecified systolic (congestive) heart failure: Secondary | ICD-10-CM | POA: Diagnosis not present

## 2019-06-19 DIAGNOSIS — I2510 Atherosclerotic heart disease of native coronary artery without angina pectoris: Secondary | ICD-10-CM | POA: Diagnosis not present

## 2019-06-19 DIAGNOSIS — N183 Chronic kidney disease, stage 3 unspecified: Secondary | ICD-10-CM | POA: Diagnosis not present

## 2019-06-19 DIAGNOSIS — I480 Paroxysmal atrial fibrillation: Secondary | ICD-10-CM | POA: Diagnosis not present

## 2019-06-25 DIAGNOSIS — Z7901 Long term (current) use of anticoagulants: Secondary | ICD-10-CM | POA: Diagnosis not present

## 2019-06-25 DIAGNOSIS — I4821 Permanent atrial fibrillation: Secondary | ICD-10-CM | POA: Diagnosis not present

## 2019-07-17 ENCOUNTER — Ambulatory Visit (INDEPENDENT_AMBULATORY_CARE_PROVIDER_SITE_OTHER): Payer: Medicare Other

## 2019-07-17 ENCOUNTER — Encounter (HOSPITAL_COMMUNITY): Payer: Self-pay

## 2019-07-17 ENCOUNTER — Other Ambulatory Visit: Payer: Self-pay

## 2019-07-17 ENCOUNTER — Ambulatory Visit (HOSPITAL_COMMUNITY)
Admission: EM | Admit: 2019-07-17 | Discharge: 2019-07-17 | Disposition: A | Discharge status: Home / Self Care | Payer: Medicare Other | Attending: Family Medicine | Admitting: Family Medicine

## 2019-07-17 DIAGNOSIS — Z7901 Long term (current) use of anticoagulants: Secondary | ICD-10-CM | POA: Diagnosis not present

## 2019-07-17 DIAGNOSIS — I480 Paroxysmal atrial fibrillation: Secondary | ICD-10-CM | POA: Insufficient documentation

## 2019-07-17 DIAGNOSIS — Z20822 Contact with and (suspected) exposure to covid-19: Secondary | ICD-10-CM | POA: Insufficient documentation

## 2019-07-17 DIAGNOSIS — Z79899 Other long term (current) drug therapy: Secondary | ICD-10-CM | POA: Insufficient documentation

## 2019-07-17 DIAGNOSIS — J0191 Acute recurrent sinusitis, unspecified: Secondary | ICD-10-CM | POA: Diagnosis not present

## 2019-07-17 DIAGNOSIS — I255 Ischemic cardiomyopathy: Secondary | ICD-10-CM | POA: Diagnosis not present

## 2019-07-17 DIAGNOSIS — E785 Hyperlipidemia, unspecified: Secondary | ICD-10-CM | POA: Insufficient documentation

## 2019-07-17 DIAGNOSIS — Z880 Allergy status to penicillin: Secondary | ICD-10-CM | POA: Insufficient documentation

## 2019-07-17 DIAGNOSIS — Z7951 Long term (current) use of inhaled steroids: Secondary | ICD-10-CM | POA: Insufficient documentation

## 2019-07-17 DIAGNOSIS — Z7982 Long term (current) use of aspirin: Secondary | ICD-10-CM | POA: Diagnosis not present

## 2019-07-17 DIAGNOSIS — R05 Cough: Secondary | ICD-10-CM | POA: Insufficient documentation

## 2019-07-17 DIAGNOSIS — J45909 Unspecified asthma, uncomplicated: Secondary | ICD-10-CM | POA: Diagnosis not present

## 2019-07-17 DIAGNOSIS — I2510 Atherosclerotic heart disease of native coronary artery without angina pectoris: Secondary | ICD-10-CM | POA: Insufficient documentation

## 2019-07-17 DIAGNOSIS — I252 Old myocardial infarction: Secondary | ICD-10-CM | POA: Diagnosis not present

## 2019-07-17 DIAGNOSIS — I5022 Chronic systolic (congestive) heart failure: Secondary | ICD-10-CM | POA: Insufficient documentation

## 2019-07-17 DIAGNOSIS — R0602 Shortness of breath: Secondary | ICD-10-CM | POA: Diagnosis not present

## 2019-07-17 DIAGNOSIS — I110 Hypertensive heart disease with heart failure: Secondary | ICD-10-CM | POA: Diagnosis not present

## 2019-07-17 HISTORY — DX: Unspecified asthma, uncomplicated: J45.909

## 2019-07-17 MED ORDER — MUCINEX 600 MG PO TB12: 600.0000 mg | ORAL_TABLET | Freq: Two times a day (BID) | ORAL | 0 refills | Status: AC

## 2019-07-17 MED ORDER — DOXYCYCLINE HYCLATE 100 MG PO CAPS: 100.0000 mg | ORAL_CAPSULE | Freq: Two times a day (BID) | ORAL | 0 refills | Status: DC

## 2019-07-17 MED ORDER — IPRATROPIUM BROMIDE 0.03 % NA SOLN: 2.0000 | Freq: Two times a day (BID) | NASAL | 12 refills | Status: DC

## 2019-07-17 MED ORDER — SALINE SPRAY 0.65 % NA SOLN: 1.0000 | NASAL | 0 refills | Status: DC | PRN

## 2019-07-17 NOTE — ED Provider Notes (Signed)
[place]    CSN: [id number] Arrival date & time: 07/17/19  1527      History   Chief Complaint Chief Complaint  Patient presents with  . Cough    HPI Tanner Ferguson is a 86 y.o. male.   Patient with history of proximal atrial fibrillation, CAD and CHF is brought to urgent care for evaluation of productive cough, sinus congestion and occasional shortness of breath.  Reported an patient and patient's daughter-in-law that around 6 weeks ago patient was treated for sinus infection with doxycycline for 7 days, and more or less did not get better following this.  He has had continued sinus congestion and a productive cough since that time.  Cough has been productive with yellow sputum.  Patient does report he feels like he is draining down his throat.  Denies any headaches or fevers or chills.  He does endorse occasional shortness of breath and has a history of asthma that he utilizes albuterol inhaler for.  He is also on daily Symbicort for asthma.  Daughter-in-law was concerned as she is a Immunologist and listen to his lungs and felt that he had diminished sounds on the left side and was worried about a pneumonia.  She does endorse that he has been wheezing and has been giving the albuterol which helps this.  He denies any chest pain.   Denies a sore throat, nausea, vomiting, diarrhea, abdominal pain.       Past Medical History:  Diagnosis Date  . Asthma   . CAD (coronary artery disease)    s/p NSTEMI 9/19. Cath in [place], [place] 10/19. Severe CAD not amenable to PCI. Not CABG candidate  . CHF (congestive heart failure) (HCC)    EF 40-45% by echo due to iCM. EF 36% by Myoview 9/19  . Dementia ([place])   . Essential tremor   . Hyperlipidemia    Intolerant of statins  . Hypertension   . PAF (paroxysmal atrial fibrillation) ([place])   . Prostate cancer [facility])     Patient Active Problem List   Diagnosis Date Noted  . Other pancytopenia ([place]) 11/13/2018  . Hyponatremia 10/20/2018    . Collapse with respiratory arrest on examination ([place]) 10/20/2018  . Hyperlipidemia   . Essential tremor   . Hypertension   . Dementia ([place])   . Anemia   . Cellulitis of finger of right hand 10/19/2018  . History of mechanical aortic valve replacement 03/09/2018  . Chronic systolic heart failure ([place]) 03/09/2018  . Anticoagulant long-term use 03/09/2018  . Ischemic cardiomyopathy 03/09/2018  . Coronary artery disease   . Paroxysmal atrial fibrillation ([place]) 01/25/2018    History reviewed. No pertinent surgical history.     Home Medications    Prior to Admission medications   Medication Sig Start Date End Date Taking? Authorizing Provider  albuterol (VENTOLIN HFA) 108 (90 Base) MCG/ACT inhaler Inhale 1-2 puffs into the lungs every 4 (four) hours as needed for wheezing or shortness of breath.  08/01/18   [provider]  aspirin EC 81 MG tablet Take 1 tablet (81 mg total) by mouth daily with breakfast. 10/21/18   Roxan Hockey, MD  doxycycline (VIBRAMYCIN) 100 MG capsule Take 1 capsule (100 mg total) by mouth 2 (two) times daily. 07/17/19   Lokelani Lutes, Marguerita Beards, PA-C  guaiFENesin (MUCINEX) 600 MG 12 hr tablet Take 1 tablet (600 mg total) by mouth 2 (two) times daily for 7 days. 07/17/19 07/24/19  Makaylyn Sinyard, Marguerita Beards, PA-C  ipratropium (ATROVENT)  0.03 % nasal spray Place 2 sprays into both nostrils every 12 (twelve) hours. 07/17/19   Kynesha Guerin, Marguerita Beards, PA-C  PACERONE 200 MG tablet Take 1/2 (one-half) tablet by mouth once daily 04/24/19   Buford Dresser, MD  rosuvastatin (CRESTOR) 20 MG tablet Take 1 tablet (20 mg total) by mouth every evening. 10/21/18 03/27/19  Roxan Hockey, MD  sodium chloride (OCEAN) 0.65 % SOLN nasal spray Place 1 spray into both nostrils as needed for congestion. 07/17/19   Shakisha Abend, Marguerita Beards, PA-C  SYMBICORT 80-4.5 MCG/ACT inhaler Inhale 2 puffs into the lungs 2 (two) times daily. 11/29/18   Mannam, Hart Robinsons, MD  tamsulosin (FLOMAX) 0.4 MG CAPS capsule Take 0.4 mg by  mouth daily after supper.     [provider]  warfarin (COUMADIN) 3 MG tablet Take 3 mg by mouth daily.    [provider]    Family History Family History  Family history unknown: Yes    Social History Social History   Tobacco Use  . Smoking status: Never Smoker  . Smokeless tobacco: Former Systems developer    Types: Chew  Substance Use Topics  . Alcohol use: Not Currently  . Drug use: Not on file     Allergies   Penicillins   Review of Systems Review of Systems   Physical Exam Triage Vital Signs ED Triage Vitals  Enc Vitals Group     BP 07/17/19 1614 112/67     Pulse Rate 07/17/19 1614 (!) 54     Resp 07/17/19 1614 18     Temp 07/17/19 1614 98.1 F (36.7 C)     Temp src --      SpO2 07/17/19 1614 100 %     Weight 07/17/19 1613 152 lb (68.9 kg)     Height --      Head Circumference --      Peak Flow --      Pain Score 07/17/19 1612 5     Pain Loc --      Pain Edu? --      Excl. in [place]? --    No data found.  Updated Vital Signs BP 112/67 (BP Location: Right Arm)   Pulse (!) 54   Temp 98.1 F (36.7 C)   Resp 18   Wt 152 lb (68.9 kg)   SpO2 100%   BMI 27.80 kg/m   Visual Acuity Right Eye Distance:   Left Eye Distance:   Bilateral Distance:    Right Eye Near:   Left Eye Near:    Bilateral Near:     Physical Exam Vitals and nursing note reviewed.  Constitutional:      General: He is not in acute distress.    Appearance: He is well-developed. He is not ill-appearing.  HENT:     Head: Normocephalic and atraumatic.     Nose:     Comments: Turbinates bilaterally with erythema and swelling.  There is mucopurulent discharge.    Mouth/Throat:     Mouth: Mucous membranes are moist.     Comments: Significant postnasal drip visible Eyes:     Conjunctiva/sclera: Conjunctivae normal.     Pupils: Pupils are equal, round, and reactive to light.  Cardiovascular:     Rate and Rhythm: Normal rate and regular rhythm.     Heart sounds: No  murmur.  Pulmonary:     Effort: Pulmonary effort is normal. No respiratory distress.     Breath sounds: Wheezing present. No rhonchi or rales (Mild occasional wheezes  in the right fields.  This is not constant and clears from 1 inspiration to the next).  Abdominal:     Palpations: Abdomen is soft.     Tenderness: There is no abdominal tenderness.  Musculoskeletal:     Cervical back: Neck supple.  Skin:    General: Skin is warm and dry.  Neurological:     General: No focal deficit present.     Mental Status: He is alert and oriented to person, place, and time.      UC Treatments / Results  Labs (all labs ordered are listed, but only abnormal results are displayed) Labs Reviewed  SARS CORONAVIRUS 2 (TAT 6-24 HRS)    EKG   Radiology DG Chest 2 View  Result Date: 07/17/2019 CLINICAL DATA:  Productive cough and shortness of breath. EXAM: CHEST - 2 VIEW COMPARISON:  08/11/2018 FINDINGS: Low volume film. The cardio pericardial silhouette is enlarged. Interstitial markings are diffusely coarsened with chronic features. Similar appearance basilar chronic atelectasis or scarring. The lungs are clear without focal pneumonia, edema, pneumothorax or pleural effusion. Bones are diffusely demineralized. IMPRESSION: Chronic interstitial changes without acute cardiopulmonary findings. Electronically Signed   By: Misty Stanley M.D.   On: 07/17/2019 17:24    Procedures Procedures (including critical care time)  Medications Ordered in UC Medications - No data to display  Initial Impression / Assessment and Plan / UC Course  I have reviewed the triage vital signs and the nursing notes.  Pertinent labs & imaging results that were available during my care of the patient were reviewed by me and considered in my medical decision making (see chart for details).     #Acute sinusitis Patient is an 86 year old with recurrent versus chronic sinusitis.  Chest x-ray did not show pneumonia and I  believe his cough is likely driven by postnasal drip from the sinusitis.  Given he had a 7-day trial of doxycycline I do feel that he we should retreat with a 10-day trial.  Patient has a penicillin allergy and cannot be verified whether he has had Augmentin in the past or not.  Also will recommend nasal ipratropium, Mucinex and nasal saline throughout the day.  Patient to follow-up with primary care in 7 to 10 days for reevaluation.  Strict return and emergency department precautions were discussed.  We also discussed doxycycline's impact on Coumadin and he is to closely follow with his Coumadin clinic.  Feel current albuterol regiment will manage his wheezing.  Patient and daughter-in-law verbalize full understanding of the plan.  Covid PCR was also sent. -Case was discussed with supervising physician. Final Clinical Impressions(s) / UC Diagnoses   Final diagnoses:  Acute recurrent sinusitis, unspecified location     Discharge Instructions     Start the doxycycline twice a day Use the ipratropium nasal spray twice a day Take the Mucinex twice a day.  Ensure you drink plenty of fluids when taking this Use nasal saline throughout the day  Call your Coumadin clinic to discuss monitoring precautions while on doxycycline.    Schedule follow-up with your primary care in 7 to 10 days for reevaluation  Continue use of albuterol and other asthma treatments at home  If worsening symptoms such as worsening shortness of breath, high fever prior to reevaluation by primary care please report to the emergency department for evaluation.      ED Prescriptions    Medication Sig Dispense Auth. Provider   doxycycline (VIBRAMYCIN) 100 MG capsule Take 1 capsule (100 mg  total) by mouth 2 (two) times daily. 20 capsule Saphia Vanderford, Marguerita Beards, PA-C   ipratropium (ATROVENT) 0.03 % nasal spray Place 2 sprays into both nostrils every 12 (twelve) hours. 30 mL Shruthi Northrup, Marguerita Beards, PA-C   guaiFENesin (MUCINEX) 600 MG 12 hr  tablet Take 1 tablet (600 mg total) by mouth 2 (two) times daily for 7 days. 14 tablet Lenzie Montesano, Marguerita Beards, PA-C   sodium chloride (OCEAN) 0.65 % SOLN nasal spray Place 1 spray into both nostrils as needed for congestion. 44 mL Lelend Heinecke, Marguerita Beards, PA-C     PDMP not reviewed this encounter.   Purnell Shoemaker, PA-C 07/17/19 2121

## 2019-07-17 NOTE — ED Triage Notes (Signed)
Pt states daughter in law states the pt has had a sinus infection and has been treated. Pt is not getting better. Pt daughter in law would like to have his check for pneumonia.

## 2019-07-17 NOTE — Discharge Instructions (Signed)
Start the doxycycline twice a day Use the ipratropium nasal spray twice a day Take the Mucinex twice a day.  Ensure you drink plenty of fluids when taking this Use nasal saline throughout the day  Call your Coumadin clinic to discuss monitoring precautions while on doxycycline.    Schedule follow-up with your primary care in 7 to 10 days for reevaluation  Continue use of albuterol and other asthma treatments at home  If worsening symptoms such as worsening shortness of breath, high fever prior to reevaluation by primary care please report to the emergency department for evaluation.

## 2019-07-18 LAB — SARS CORONAVIRUS 2 (TAT 6-24 HRS): SARS Coronavirus 2: NEGATIVE

## 2019-07-23 DIAGNOSIS — I4821 Permanent atrial fibrillation: Secondary | ICD-10-CM | POA: Diagnosis not present

## 2019-07-23 DIAGNOSIS — Z7901 Long term (current) use of anticoagulants: Secondary | ICD-10-CM | POA: Diagnosis not present

## 2019-08-04 ENCOUNTER — Other Ambulatory Visit: Payer: Self-pay | Admitting: Cardiology

## 2019-08-06 NOTE — Telephone Encounter (Signed)
Rx request sent to pharmacy.  

## 2019-08-08 DIAGNOSIS — R05 Cough: Secondary | ICD-10-CM | POA: Diagnosis not present

## 2019-08-08 DIAGNOSIS — R053 Chronic cough: Secondary | ICD-10-CM | POA: Insufficient documentation

## 2019-08-08 DIAGNOSIS — Z7901 Long term (current) use of anticoagulants: Secondary | ICD-10-CM | POA: Diagnosis not present

## 2019-09-12 DIAGNOSIS — Z8546 Personal history of malignant neoplasm of prostate: Secondary | ICD-10-CM | POA: Diagnosis not present

## 2019-09-12 DIAGNOSIS — I10 Essential (primary) hypertension: Secondary | ICD-10-CM | POA: Diagnosis not present

## 2019-09-12 DIAGNOSIS — I252 Old myocardial infarction: Secondary | ICD-10-CM | POA: Diagnosis not present

## 2019-09-12 DIAGNOSIS — G250 Essential tremor: Secondary | ICD-10-CM | POA: Diagnosis not present

## 2019-09-12 DIAGNOSIS — I509 Heart failure, unspecified: Secondary | ICD-10-CM | POA: Diagnosis not present

## 2019-09-12 DIAGNOSIS — I2510 Atherosclerotic heart disease of native coronary artery without angina pectoris: Secondary | ICD-10-CM | POA: Diagnosis not present

## 2019-09-12 DIAGNOSIS — D696 Thrombocytopenia, unspecified: Secondary | ICD-10-CM | POA: Diagnosis not present

## 2019-09-12 DIAGNOSIS — G4733 Obstructive sleep apnea (adult) (pediatric): Secondary | ICD-10-CM | POA: Diagnosis not present

## 2019-09-12 DIAGNOSIS — Z0000 Encounter for general adult medical examination without abnormal findings: Secondary | ICD-10-CM | POA: Diagnosis not present

## 2019-09-12 DIAGNOSIS — I700 Atherosclerosis of aorta: Secondary | ICD-10-CM | POA: Diagnosis not present

## 2019-09-12 DIAGNOSIS — F0390 Unspecified dementia without behavioral disturbance: Secondary | ICD-10-CM | POA: Diagnosis not present

## 2019-09-12 DIAGNOSIS — Z1389 Encounter for screening for other disorder: Secondary | ICD-10-CM | POA: Diagnosis not present

## 2019-09-12 DIAGNOSIS — I4891 Unspecified atrial fibrillation: Secondary | ICD-10-CM | POA: Diagnosis not present

## 2019-09-12 DIAGNOSIS — E785 Hyperlipidemia, unspecified: Secondary | ICD-10-CM | POA: Diagnosis not present

## 2019-09-17 DIAGNOSIS — Z7901 Long term (current) use of anticoagulants: Secondary | ICD-10-CM | POA: Diagnosis not present

## 2019-09-17 DIAGNOSIS — I4821 Permanent atrial fibrillation: Secondary | ICD-10-CM | POA: Diagnosis not present

## 2019-09-19 ENCOUNTER — Encounter: Payer: Self-pay | Admitting: Neurology

## 2019-09-20 ENCOUNTER — Encounter: Payer: Self-pay | Admitting: Cardiology

## 2019-09-20 ENCOUNTER — Other Ambulatory Visit: Payer: Self-pay

## 2019-09-20 ENCOUNTER — Ambulatory Visit (INDEPENDENT_AMBULATORY_CARE_PROVIDER_SITE_OTHER): Payer: Medicare Other | Admitting: Cardiology

## 2019-09-20 VITALS — BP 152/58 | HR 51 | Ht 63.0 in | Wt 158.0 lb

## 2019-09-20 DIAGNOSIS — I255 Ischemic cardiomyopathy: Secondary | ICD-10-CM | POA: Diagnosis not present

## 2019-09-20 DIAGNOSIS — R0609 Other forms of dyspnea: Secondary | ICD-10-CM

## 2019-09-20 DIAGNOSIS — I480 Paroxysmal atrial fibrillation: Secondary | ICD-10-CM | POA: Diagnosis not present

## 2019-09-20 DIAGNOSIS — I2510 Atherosclerotic heart disease of native coronary artery without angina pectoris: Secondary | ICD-10-CM

## 2019-09-20 DIAGNOSIS — I2584 Coronary atherosclerosis due to calcified coronary lesion: Secondary | ICD-10-CM | POA: Diagnosis not present

## 2019-09-20 DIAGNOSIS — Z7901 Long term (current) use of anticoagulants: Secondary | ICD-10-CM

## 2019-09-20 DIAGNOSIS — R0600 Dyspnea, unspecified: Secondary | ICD-10-CM | POA: Diagnosis not present

## 2019-09-20 DIAGNOSIS — Z952 Presence of prosthetic heart valve: Secondary | ICD-10-CM

## 2019-09-20 NOTE — Patient Instructions (Signed)
Medication Instructions:  No changes *If you need a refill on your cardiac medications before your next appointment, please call your pharmacy*   Lab Work: not needed    Testing/Procedures: Not needed   Follow-Up: At [facility], you and your health needs are our priority.  As part of our continuing mission to provide you with exceptional heart care, we have created designated Provider Care Teams.  These Care Teams include your primary Cardiologist (physician) and Advanced Practice Providers (APPs -  Physician Assistants and Nurse Practitioners) who all work together to provide you with the care you need, when you need it.   Your next appointment:   6 month(s)  The format for your next appointment:   Virtual Visit   Provider:   Buford Dresser, MD   Other Instructions

## 2019-09-20 NOTE — Progress Notes (Signed)
Cardiology Office Note:    Date:  09/20/2019   ID:  Tanner Ferguson, DOB September 18, 1927, MRN [id number]  PCP:  Tanner Low, MD  Cardiologist:  Tanner Dresser, MD PhD  Referring MD: Tanner Low, MD   CC: follow up  History of Present Illness:    Tanner Ferguson is a 86 y.o. male with a hx of aortic stenosis s/p mechanical AVR (1995), CAD with MI 11/2017, ischemic cardiomyopathy, paroxysmal atrial fibrillation, obstructive sleep apnea, hypertension, hyperlipidemia  who is seen for follow up today. Please see my initial note from 03/09/18 for full summary of his cardiac history.  Today:  Here with son. Somewhat more alert/active since stopping phenytoin. However, still with limited exercise tolerance and dyspnea on exertion. Feels like he gives out when walking. [place], but has to stop after about 4 laps. Leg pain stops him first, then breathing. No swelling. Shortness of breath has been stable. He has dry cough with white sputum, possibly slightly better since stopping lisinopril. Wheezing seems to come and go, but overall improved with inhalers.   I reviewed Dr. Matilde Ferguson notes from Tanner Ferguson 09/28/18 visit. My interpretation of the plan is for continued monitoring of the asbestosis given mild fibrosis, pleural plaques, and minimal restriction/diffusion defects on PFTs. Does not think imaging is typical for amiodarone toxicity, ok to continue. Possibly has mild asthma, planned for allergy referral. Started on AutoPAP for sleep apnea.  I unfortunately cannot see Dr. Glenna Ferguson notes, but Tanner Ferguson son reports that there have been issues with Ferguson blood pressure and abnormal kidney function. He has stopped lisinopril daily, was only using PRN. Has follow up bloodwork next week to monitor kidney function.   Heart rates at home have mid-50s. No known bouts of afib.   Denies chest pain, shortness of breath at rest. No PND, orthopnea, LE edema. No syncope or  palpitations.  Past Medical History:  Diagnosis Date  . Asthma   . CAD (coronary artery disease)    s/p NSTEMI 9/19. Cath in [place], [place] 10/19. Severe CAD not amenable to PCI. Not CABG candidate  . CHF (congestive heart failure) (HCC)    EF 40-45% by echo due to iCM. EF 36% by Myoview 9/19  . Dementia ([place])   . Essential tremor   . Hyperlipidemia    Intolerant of statins  . Hypertension   . PAF (paroxysmal atrial fibrillation) ([place])   . Prostate cancer [facility])     Current Medications: Current Outpatient Medications on File Prior to Visit  Medication Sig  . albuterol (VENTOLIN HFA) 108 (90 Base) MCG/ACT inhaler Inhale 1-2 puffs into the lungs every 4 (four) hours as needed for wheezing or shortness of breath.   Marland Kitchen amiodarone (PACERONE) 200 MG tablet Take 1/2 (one-half) tablet by mouth once daily  . aspirin EC 81 MG tablet Take 1 tablet (81 mg total) by mouth daily with breakfast.  . doxycycline (VIBRAMYCIN) 100 MG capsule Take 1 capsule (100 mg total) by mouth 2 (two) times daily.  Marland Kitchen ipratropium (ATROVENT) 0.03 % nasal spray Place 2 sprays into both nostrils every 12 (twelve) hours.  . sodium chloride (OCEAN) 0.65 % SOLN nasal spray Place 1 spray into both nostrils as needed for congestion.  . SYMBICORT 80-4.5 MCG/ACT inhaler Inhale 2 puffs into the lungs 2 (two) times daily.  . tamsulosin (FLOMAX) 0.4 MG CAPS capsule Take 0.4 mg by mouth daily after supper.   . warfarin (COUMADIN) 3 MG tablet Take 3 mg by  mouth daily.  . rosuvastatin (CRESTOR) 20 MG tablet Take 1 tablet (20 mg total) by mouth every evening.   No current facility-administered medications on file prior to visit.     Allergies:   Penicillins   Social History   Tobacco Use  . Smoking status: Never Smoker  . Smokeless tobacco: Former Systems developer    Types: Secondary school teacher  . Vaping Use: Never used  Substance Use Topics  . Alcohol use: Not Currently  . Drug use: Not on file    Family History: No FH of premature  CAD  ROS:   Please see the history of present illness.  Additional pertinent ROS otherwise unremarkable.  EKGs/Labs/Other Studies Reviewed:    The following studies were reviewed today: Recent pulmonary notes/studies  EKG:  EKG is personally reviewed.  The ekg ordered today demonstrates sinus bradycardia at 51 bpm  Recent Labs: 10/20/2018: ALT 12 10/21/2018: BUN 24; Creatinine, Ser 1.48; Potassium 4.8; Sodium 128 11/13/2018: Hemoglobin 11.4; Platelet Count 111  Recent Lipid Panel No results found for: CHOL, TRIG, HDL, CHOLHDL, VLDL, LDLCALC, LDLDIRECT  Physical Exam:    VS:  BP (!) 152/58   Pulse 51   Ht 5\' 3"  (1.6 m)   Wt 158 lb (71.7 kg)   SpO2 95%   BMI 27.99 kg/m     Wt Readings from Last 3 Encounters:  09/20/19 158 lb (71.7 kg)  07/17/19 152 lb (68.9 kg)  11/29/18 158 lb 3.2 oz (71.8 kg)    GEN: Well nourished, well developed in no acute distress HEENT: Normal, moist mucous membranes NECK: No JVD CARDIAC: regular rhythm, normal S1 and crisp mechanical S2, no rubs or gallops. 1/6 SE murmur. VASCULAR: Radial and DP pulses 2+ bilaterally. No carotid bruits RESPIRATORY:  Velcro breath sounds at bases, no wheezing  ABDOMEN: Soft, non-tender, non-distended MUSCULOSKELETAL:  Ambulates independently SKIN: Warm and dry, no edema NEUROLOGIC:  Alert and oriented x 3. No focal neuro deficits noted. Has tremor PSYCHIATRIC:  Normal affect   ASSESSMENT:    1. DOE (dyspnea on exertion)   2. Paroxysmal atrial fibrillation (HCC)   3. History of mechanical aortic valve replacement   4. Ischemic cardiomyopathy   5. Anticoagulant long-term use   6. Coronary artery disease due to calcified coronary lesion   7. S/P AVR (aortic valve replacement)    PLAN:    Shortness of breath, wheezing: improved -recent echo remarkably normal. Small area of scar noted, but normal global EF, normal filling pressures (normal IVC, normal RVSP, E/e' <15), normal mechanical aortic valve gradient.  Suggests not a cardiac etiology -symptoms much improved on his current pulmonary regimen and with gradual conditioning -reviewed recent pulmonary notes and recommendations as well -currently reports that leg muscles/pain stop him before his breathing stops him  Reported intermittent hypotension and abnormal kidney function: -I cannot see Dr. Glenna Ferguson notes unfortunately, but family reports this is being monitored closely -BP is elevated today, but given reported hypotension will not make changes to medications today  Paroxysmal atrial fibrillation -very symptomatic when in afib RVR, required 2 cardioversions 2019 -coumadin monitored by Dr. Glenna Ferguson office.  -CHA2DS2/VAS Stroke Risk Points=4, but irrelevant as he needs to be on coumadin for his mechanical valve. -continue amiodarone. See prior extensive discussions regarding this, especially given his pulmonary disease.  Prior chronic systolic heart failure, 2/2 ischemic cardiomyopathy. EF 40-45% before, now normalized on most recent echo -euvolemic -no beta blocker due to bradycardia -have tried ACEi and ARB. Now on hold  2/2 prior AKI. Continue to monitor -given baseline elevated potassium, no room for spironolactone  CAD s/p NSTEMI, with diffuse severe disease seen on cath -continue aspirin -continuerosuvastatin -resting sinus bradycardia. No room for beta blocker -medical management  History of mechanical AVR, on coumadin -continue coumadin and aspirin -SBE prophylaxis with dental procedures  Plan for follow up: 6 mos or sooner as needed  Medication Adjustments/Labs and Tests Ordered: Current medicines are reviewed at length with the patient today.  Concerns regarding medicines are outlined above.  Orders Placed This Encounter  Procedures  . EKG 12-Lead   No orders of the defined types were placed in this encounter.   Patient Instructions  Medication Instructions:  No changes *If you need a refill on your cardiac  medications before your next appointment, please call your pharmacy*   Lab Work: not needed    Testing/Procedures: Not needed   Follow-Up: At [facility], you and your health needs are our priority.  As part of our continuing mission to provide you with exceptional heart care, we have created designated Provider Care Teams.  These Care Teams include your primary Cardiologist (physician) and Advanced Practice Providers (APPs -  Physician Assistants and Nurse Practitioners) who all work together to provide you with the care you need, when you need it.   Your next appointment:   6 month(s)  The format for your next appointment:   Virtual Visit   Provider:   Buford Dresser, MD   Other Instructions     Signed, Tanner Dresser, MD PhD 09/20/2019     [place]

## 2019-10-12 ENCOUNTER — Encounter: Payer: Self-pay | Admitting: Neurology

## 2019-10-12 NOTE — Progress Notes (Deleted)
Assessment/Plan:   ***  Subjective:   Tanner Ferguson was seen today in the movement disorders clinic for neurologic consultation at the request of Wenda Low, MD.  The consultation is for the evaluation of tremor and to r/o Parkinsons Disease vs ET.  Medical records made available to me are reviewed  Tremor: {yes no:314532}   How long has it been going on? ***  At rest or with activation?  ***  When is it noted the most?  ***  Fam hx of tremor?  {yes [place]:[id number]}  Located where?  ***  Affected by caffeine:  {yes no:314532}  Affected by alcohol:  {yes no:314532}  Affected by stress:  {yes no:314532}  Affected by fatigue:  {yes no:314532}  Spills soup if on spoon:  {yes no:314532}  Spills glass of liquid if full:  {yes no:314532}  Affects ADL's (tying shoes, brushing teeth, etc):  {yes no:314532}  Tremor inducing meds:  Yes.  Amiodarone ; albuterol  Other Specific Symptoms:  Voice: *** Sleep: ***  Vivid Dreams:  {yes no:314532}  Acting out dreams:  {yes no:314532} Wet Pillows: {yes no:314532} Postural symptoms:  {yes no:314532}  Falls?  {yes no:314532} Bradykinesia symptoms: {parkinson brady:18041} Loss of smell:  {yes no:314532} Loss of taste:  {yes no:314532} Urinary Incontinence:  {yes no:314532} Difficulty Swallowing:  {yes no:314532} Handwriting, micrographia: {yes no:314532} Trouble with ADL's:  {yes no:314532}  Trouble buttoning clothing: {yes no:314532} Depression:  {yes no:314532} Memory changes:  {yes no:314532} Hallucinations:  {yes no:314532}  visual distortions: {yes no:314532} N/V:  {yes no:314532} Lightheaded:  {yes no:314532}  Syncope: {yes no:314532} Diplopia:  {yes no:314532} Dyskinesia:  {yes no:314532}  Neuroimaging of the brain has not previously been performed.  It *** available for my review today.  PREVIOUS MEDICATIONS: {Parkinson's RX:18200}  ALLERGIES:   Allergies  Allergen Reactions  . Penicillins Rash    CURRENT MEDICATIONS:    Current Outpatient Medications  Medication Instructions  . albuterol (VENTOLIN HFA) 108 (90 Base) MCG/ACT inhaler 1-2 puffs, Inhalation, Every 4 hours PRN  . amiodarone (PACERONE) 200 MG tablet Take 1/2 (one-half) tablet by mouth once daily  . aspirin EC 81 mg, Oral, Daily with breakfast  . doxycycline (VIBRAMYCIN) 100 mg, Oral, 2 times daily  . ipratropium (ATROVENT) 0.03 % nasal spray 2 sprays, Each Nare, Every 12 hours  . rosuvastatin (CRESTOR) 20 mg, Oral, Every evening  . sodium chloride (OCEAN) 0.65 % SOLN nasal spray 1 spray, Each Nare, As needed  . SYMBICORT 80-4.5 MCG/ACT inhaler 2 puffs, Inhalation, 2 times daily  . tamsulosin (FLOMAX) 0.4 mg, Oral, Daily after supper  . warfarin (COUMADIN) 3 mg, Oral, Daily    Objective:   PHYSICAL EXAMINATION:    VITALS:  There were no vitals filed for this visit.  GEN:  The patient appears stated age and is in NAD. HEENT:  Normocephalic, atraumatic.  The mucous membranes are moist. The superficial temporal arteries are without ropiness or tenderness. CV:  RRR Lungs:  CTAB Neck/HEME:  There are no carotid bruits bilaterally.  Neurological examination:  Orientation: The patient is alert and oriented x3.  Cranial nerves: There is good facial symmetry.  Extraocular muscles are intact. The visual fields are full to confrontational testing. The speech is fluent and clear. Soft palate rises symmetrically and there is no tongue deviation. Hearing is intact to conversational tone. Sensation: Sensation is intact to light touch throughout (facial, trunk, extremities). Vibration is intact at the bilateral big toe. There is no extinction with double simultaneous  stimulation.  Motor: Strength is 5/5 in the bilateral upper and lower extremities.   Shoulder shrug is equal and symmetric.  There is no pronator drift. Deep tendon reflexes: Deep tendon reflexes are 2/4 at the bilateral biceps, triceps, brachioradialis, patella and achilles. Plantar  responses are downgoing bilaterally.  Movement examination: Tone: There is ***tone in the bilateral upper extremities.  The tone in the lower extremities is ***.  Abnormal movements: *** Coordination:  There is *** decremation with RAM's, *** Gait and Station: The patient has *** difficulty arising out of a deep-seated chair without the use of the hands. The patient's stride length is ***.  The patient has a *** pull test.     I have reviewed and interpreted the following labs independently   Chemistry      Component Value Date/Time   NA 128 (L) 10/21/2018 0232   K 4.8 10/21/2018 0232   CL 101 10/21/2018 0232   CO2 21 (L) 10/21/2018 0232   BUN 24 (H) 10/21/2018 0232   CREATININE 1.48 (H) 10/21/2018 0232      Component Value Date/Time   CALCIUM 8.5 (L) 10/21/2018 0232   ALKPHOS 46 10/20/2018 0240   AST 18 10/20/2018 0240   ALT 12 10/20/2018 0240   BILITOT 0.4 10/20/2018 0240      No results found for: TSH Lab Results  Component Value Date   WBC 2.9 (L) 11/13/2018   HGB 11.4 (L) 11/13/2018   HCT 35.6 (L) 11/13/2018   MCV 93.7 11/13/2018   PLT 111 (L) 11/13/2018   Patient had lab work with primary care on September 12, 2019.  White blood cells 2.8, hemoglobin 12.4, hematocrit 36.8 and platelets 127.   Total time spent on today's visit was ***greater than 60 minutes, including both face-to-face time and nonface-to-face time.  Time included that spent on review of records (prior notes available to me/labs/imaging if pertinent), discussing treatment and goals, answering patient's questions and coordinating care.  Cc:  Wenda Low, MD

## 2019-10-14 ENCOUNTER — Other Ambulatory Visit: Payer: Self-pay | Admitting: Cardiology

## 2019-10-15 DIAGNOSIS — I4821 Permanent atrial fibrillation: Secondary | ICD-10-CM | POA: Diagnosis not present

## 2019-10-15 DIAGNOSIS — Z7901 Long term (current) use of anticoagulants: Secondary | ICD-10-CM | POA: Diagnosis not present

## 2019-10-16 ENCOUNTER — Ambulatory Visit: Payer: Medicare Other | Admitting: Neurology

## 2019-10-20 ENCOUNTER — Other Ambulatory Visit: Payer: Self-pay | Admitting: Pulmonary Disease

## 2019-10-25 NOTE — Progress Notes (Signed)
Assessment/Plan:   1.   Essential Tremor.  -This is evidenced by the symmetrical nature and longstanding hx of gradually getting worse.  We discussed nature and pathophysiology.  We discussed that this can continue to gradually get worse with time.  We discussed that some medications can worsen this, as can caffeine use.  We discussed medication therapy as well as surgical therapy.  I was clear with the patient that medical therapy alone would not get rid of his degree of tremor, which is severe, but this is where we need to start.  In addition, he is on Coumadin which would make surgical therapy hard, in addition to his advancing age.  Ultimately, the patient decided to try primidone.  I did discuss with the patient that this does interact with Coumadin, and they would need to take a close look at his INR as we start the medication.  They are actually monitoring it weekly.  We will start primidone, 50 mg, half tablet at bedtime for a week, then go to 1 tablet at bedtime for a week and if he tolerates that we will go to 1 tablet twice per day.  I did tell him that this likely would not be enough, but it is certainly a starting point.  My goal is to make the tremor tolerable, so that he can at least eat.  I suspect we will need a much higher dosage.  -Discussed both DBS and focused ultrasound in detail.  He was shown HIPAA compliant videos.  He actually may be a better focused ultrasound candidate given the Coumadin.  They asked questions and I answered those to the best of my ability.  If they decide to pursue focused ultrasound, I would recommend [place].  2.  Follow up is anticipated in the next 4-6 months, sooner should new neurologic issues arise.   Subjective:   Tanner Ferguson was seen today in the movement disorders clinic for neurologic consultation at the request of Wenda Low, MD.  The consultation is for the evaluation of tremor and to r/o Parkinsons Disease vs ET.  Medical  records made available to me are reviewed.  Daughter in law supplements history and is in the room with the patient.    Tremor: Yes.     How long has it been going on? 16 + years (dx by PCP years ago with ET) - pt is R hand dominant  At rest or with activation?  activation  When is it noted the most?  eating  Fam hx of tremor?  Yes.  , sister had tremor before she died (unknown dx)  Located where?  Daughter in law states started on one side and went to the other; head and jaw tremor in the last 10 years  Affected by caffeine: doesn't drink any  Affected by alcohol:  doesn't drink any  Affected by stress:  Yes.    Affected by fatigue:  No.  Spills soup if on spoon:  May or may not  Spills glass of liquid if full:  Yes.    Affects ADL's (tying shoes, brushing teeth, etc):  May or may not  Tremor inducing meds:  Yes.  Amiodarone ( been on it x 2 years and they don't think that it changed tremor) ; albuterol  Other Specific Symptoms:  Voice: no change Sleep: sleeps well  Vivid Dreams:  No.  Acting out dreams:  No. Wet Pillows: No. Postural symptoms:  Yes.    Falls?  Yes.   ,  uncommon but fell 2 days ago on soft ground and was wearing socks.  Didn't get hurt Bradykinesia symptoms: difficulty getting out of a chair Loss of smell:  No. Loss of taste:  No. Urinary Incontinence:  No.  Difficulty Swallowing:  No. per pt but some per daughter with meats N/V:  No. Lightheaded:  No.  Syncope: No. Diplopia:  No. Dyskinesia:  No.  Neuroimaging of the brain has not previously been performed.      PREVIOUS MEDICATIONS: They report that primary care gave him Dilantin years ago and told him that perhaps it could help tremor.  He had been on this for seizure in the past as well (single seizure in lifetime)  ALLERGIES:   Allergies  Allergen Reactions  . Penicillins Rash    CURRENT MEDICATIONS:  Current Outpatient Medications  Medication Instructions  . albuterol (VENTOLIN HFA) 108 (90  Base) MCG/ACT inhaler 1-2 puffs, Inhalation, Every 4 hours PRN  . amiodarone (PACERONE) 200 MG tablet Take 1/2 (one-half) tablet by mouth once daily  . aspirin EC 81 mg, Oral, Daily with breakfast  . ipratropium (ATROVENT) 0.03 % nasal spray 2 sprays, Each Nare, Every 12 hours  . primidone (MYSOLINE) 50 mg, Oral, 2 times daily  . rosuvastatin (CRESTOR) 20 mg, Oral, Every evening  . sodium chloride (OCEAN) 0.65 % SOLN nasal spray 1 spray, Each Nare, As needed  . SYMBICORT 80-4.5 MCG/ACT inhaler Inhale 2 puffs by mouth twice daily  . tamsulosin (FLOMAX) 0.4 mg, Oral, Daily after supper  . warfarin (COUMADIN) 3 mg, Oral, Daily    Objective:   PHYSICAL EXAMINATION:    VITALS:   Vitals:   10/29/19 1251  BP: (!) 151/56  Pulse: (!) 56  SpO2: 96%  Weight: 158 lb (71.7 kg)  Height: 5\' 4"  (1.626 m)    GEN:  The patient appears stated age and is in NAD. HEENT:  Normocephalic, atraumatic.  The mucous membranes are moist. The superficial temporal arteries are without ropiness or tenderness. CV:  Loletha Grayer.  regular Lungs:  CTAB Neck/HEME:  There are no carotid bruits bilaterally.  Neurological examination:  Orientation: The patient is alert and oriented x3.  Cranial nerves: There is good facial symmetry.  Extraocular muscles are intact. The visual fields are full to confrontational testing. The speech is fluent and clear. Soft palate rises symmetrically and there is no tongue deviation. Hearing is decreased to conversational tone. Sensation: Sensation is intact to light touch throughout (facial, trunk, extremities). Vibration is decreased distally. There is no extinction with double simultaneous stimulation.  Motor: Strength is 5/5 in the bilateral upper and lower extremities.   Shoulder shrug is equal and symmetric.  There is no pronator drift. Deep tendon reflexes: Deep tendon reflexes are 0/4 at the bilateral biceps, triceps, brachioradialis, patella and achilles. Plantar responses are  downgoing bilaterally.  Movement examination: Tone: There is normal tone in the bilateral upper extremities.  The tone in the lower extremities is normal.  Abnormal movements: there is LUE rest tremor>RUE.  There is postural tremor.  He has significant trouble with Archimedes spirals.  When asked to pour water from 1 glass to another, he cannot even pick up the empty glass, so then declines picking up the one with water in it, stating that he was just going to make a mess and did not want to do that. Coordination:  There is  decremation with RAM's, only with alternation of supination/pronation of the forearm on the L. Gait and Station: The patient  has pushes off of the chair to arise.  He actually walks well down the hall.  He uses no ambulatory assistive device.   I have reviewed and interpreted the following labs independently   Chemistry      Component Value Date/Time   NA 128 (L) 10/21/2018 0232   K 4.8 10/21/2018 0232   CL 101 10/21/2018 0232   CO2 21 (L) 10/21/2018 0232   BUN 24 (H) 10/21/2018 0232   CREATININE 1.48 (H) 10/21/2018 0232      Component Value Date/Time   CALCIUM 8.5 (L) 10/21/2018 0232   ALKPHOS 46 10/20/2018 0240   AST 18 10/20/2018 0240   ALT 12 10/20/2018 0240   BILITOT 0.4 10/20/2018 0240      No results found for: TSH Lab Results  Component Value Date   WBC 2.9 (L) 11/13/2018   HGB 11.4 (L) 11/13/2018   HCT 35.6 (L) 11/13/2018   MCV 93.7 11/13/2018   PLT 111 (L) 11/13/2018   Patient had lab work with primary care on September 12, 2019.  White blood cells 2.8, hemoglobin 12.4, hematocrit 36.8 and platelets 127.   Total time spent on today's visit was 70 minutes, including both face-to-face time and nonface-to-face time.  Time included that spent on review of records (prior notes available to me/labs/imaging if pertinent), discussing treatment and goals, answering patient's questions and coordinating care.  Cc:  Wenda Low, MD

## 2019-10-29 ENCOUNTER — Ambulatory Visit (INDEPENDENT_AMBULATORY_CARE_PROVIDER_SITE_OTHER): Payer: Medicare Other | Admitting: Neurology

## 2019-10-29 ENCOUNTER — Other Ambulatory Visit: Payer: Self-pay

## 2019-10-29 ENCOUNTER — Encounter: Payer: Self-pay | Admitting: Neurology

## 2019-10-29 VITALS — BP 151/56 | HR 56 | Ht 60.0 in | Wt 158.0 lb

## 2019-10-29 DIAGNOSIS — G250 Essential tremor: Secondary | ICD-10-CM | POA: Diagnosis not present

## 2019-10-29 DIAGNOSIS — I255 Ischemic cardiomyopathy: Secondary | ICD-10-CM | POA: Diagnosis not present

## 2019-10-29 MED ORDER — PRIMIDONE 50 MG PO TABS: 50.0000 mg | ORAL_TABLET | Freq: Two times a day (BID) | ORAL | 1 refills | Status: DC

## 2019-10-29 NOTE — Patient Instructions (Signed)
Start primidone 50 mg - 1/2 tablet at bedtime for 1 week and then increase to 1 tablet at bedtime for a week.  If tolerated, you can then increase to primidone, 50 mg, 1 tablet twice a day.  This probably won't be enough and we will likely need to increase it further but given your age and medical problems, we will re-evaluate it in the future.    You will need to monitor your INR for your coumadin.  The physicians and staff at [facility] are committed to providing excellent care. You may receive a survey requesting feedback about your experience at our office. We strive to receive "very good" responses to the survey questions. If you feel that your experience would prevent you from giving the office a "very good " response, please contact our office to try to remedy the situation. We may be reached at [phone]. Thank you for taking the time out of your busy day to complete the survey.

## 2019-11-12 DIAGNOSIS — I4821 Permanent atrial fibrillation: Secondary | ICD-10-CM | POA: Diagnosis not present

## 2019-11-12 DIAGNOSIS — Z7901 Long term (current) use of anticoagulants: Secondary | ICD-10-CM | POA: Diagnosis not present

## 2019-11-13 ENCOUNTER — Ambulatory Visit: Payer: Medicare Other | Attending: Internal Medicine

## 2019-11-13 DIAGNOSIS — Z23 Encounter for immunization: Secondary | ICD-10-CM

## 2019-11-13 NOTE — Progress Notes (Signed)
   Covid-19 Vaccination Clinic  Name:  Tanner Ferguson    MRN: [id number] DOB: Jun 17, 1927  11/13/2019  Mr. Radziewicz was observed post Covid-19 immunization for 15 minutes without incident. He was provided with Vaccine Information Sheet and instruction to access the V-Safe system. Vaccinated By: Joni Reining  Mr. Manville was instructed to call 911 with any severe reactions post vaccine: Marland Kitchen Difficulty breathing  . Swelling of face and throat  . A fast heartbeat  . A bad rash all over body  . Dizziness and weakness

## 2019-11-21 ENCOUNTER — Telehealth: Payer: Self-pay | Admitting: Neurology

## 2019-11-21 NOTE — Telephone Encounter (Signed)
Called pharmacy, message left to call office back, what medication is interacting

## 2019-11-22 NOTE — Telephone Encounter (Signed)
See last note.  Pt and I are aware of the interactions and INR is being monitored.

## 2019-11-22 NOTE — Telephone Encounter (Signed)
Spoke with pharmacist and informed him that Dr Tat is aware if the interaction and his INR is being monitored. He voiced understanding and thanked me for calling.

## 2019-12-03 ENCOUNTER — Other Ambulatory Visit: Payer: Self-pay | Admitting: Pulmonary Disease

## 2019-12-07 ENCOUNTER — Encounter: Payer: Self-pay | Admitting: Cardiology

## 2019-12-10 DIAGNOSIS — Z7901 Long term (current) use of anticoagulants: Secondary | ICD-10-CM | POA: Diagnosis not present

## 2019-12-10 DIAGNOSIS — I4821 Permanent atrial fibrillation: Secondary | ICD-10-CM | POA: Diagnosis not present

## 2019-12-21 DIAGNOSIS — R32 Unspecified urinary incontinence: Secondary | ICD-10-CM | POA: Diagnosis not present

## 2019-12-21 DIAGNOSIS — I2510 Atherosclerotic heart disease of native coronary artery without angina pectoris: Secondary | ICD-10-CM | POA: Diagnosis not present

## 2019-12-21 DIAGNOSIS — Z23 Encounter for immunization: Secondary | ICD-10-CM | POA: Diagnosis not present

## 2019-12-21 DIAGNOSIS — I129 Hypertensive chronic kidney disease with stage 1 through stage 4 chronic kidney disease, or unspecified chronic kidney disease: Secondary | ICD-10-CM | POA: Diagnosis not present

## 2019-12-21 DIAGNOSIS — I480 Paroxysmal atrial fibrillation: Secondary | ICD-10-CM | POA: Diagnosis not present

## 2019-12-21 DIAGNOSIS — N183 Chronic kidney disease, stage 3 unspecified: Secondary | ICD-10-CM | POA: Diagnosis not present

## 2019-12-21 DIAGNOSIS — I5020 Unspecified systolic (congestive) heart failure: Secondary | ICD-10-CM | POA: Diagnosis not present

## 2019-12-21 DIAGNOSIS — E871 Hypo-osmolality and hyponatremia: Secondary | ICD-10-CM | POA: Diagnosis not present

## 2020-01-07 DIAGNOSIS — I4821 Permanent atrial fibrillation: Secondary | ICD-10-CM | POA: Diagnosis not present

## 2020-01-07 DIAGNOSIS — Z7901 Long term (current) use of anticoagulants: Secondary | ICD-10-CM | POA: Diagnosis not present

## 2020-02-04 DIAGNOSIS — I4821 Permanent atrial fibrillation: Secondary | ICD-10-CM | POA: Diagnosis not present

## 2020-02-04 DIAGNOSIS — Z7901 Long term (current) use of anticoagulants: Secondary | ICD-10-CM | POA: Diagnosis not present

## 2020-03-13 ENCOUNTER — Telehealth: Payer: Self-pay | Admitting: Cardiology

## 2020-03-13 DIAGNOSIS — I10 Essential (primary) hypertension: Secondary | ICD-10-CM | POA: Diagnosis not present

## 2020-03-13 DIAGNOSIS — D61818 Other pancytopenia: Secondary | ICD-10-CM | POA: Diagnosis not present

## 2020-03-13 DIAGNOSIS — Z8546 Personal history of malignant neoplasm of prostate: Secondary | ICD-10-CM | POA: Diagnosis not present

## 2020-03-13 DIAGNOSIS — Z952 Presence of prosthetic heart valve: Secondary | ICD-10-CM | POA: Diagnosis not present

## 2020-03-13 DIAGNOSIS — G250 Essential tremor: Secondary | ICD-10-CM | POA: Diagnosis not present

## 2020-03-13 DIAGNOSIS — I700 Atherosclerosis of aorta: Secondary | ICD-10-CM | POA: Diagnosis not present

## 2020-03-13 DIAGNOSIS — I4891 Unspecified atrial fibrillation: Secondary | ICD-10-CM | POA: Diagnosis not present

## 2020-03-13 DIAGNOSIS — D6869 Other thrombophilia: Secondary | ICD-10-CM | POA: Diagnosis not present

## 2020-03-13 DIAGNOSIS — I509 Heart failure, unspecified: Secondary | ICD-10-CM | POA: Diagnosis not present

## 2020-03-13 DIAGNOSIS — D696 Thrombocytopenia, unspecified: Secondary | ICD-10-CM | POA: Diagnosis not present

## 2020-03-13 DIAGNOSIS — N1831 Chronic kidney disease, stage 3a: Secondary | ICD-10-CM | POA: Diagnosis not present

## 2020-03-13 DIAGNOSIS — F0390 Unspecified dementia without behavioral disturbance: Secondary | ICD-10-CM | POA: Diagnosis not present

## 2020-03-13 NOTE — Telephone Encounter (Signed)
Tanner Ferguson, Son of the patient called. He had some questions about transitioning to our Coumadin Clinic. The patient's INR is being monitored by another provider but the Son would like to know what he would need to do so that our Coumadin clinic would monitor the INR. Please advise

## 2020-03-13 NOTE — Telephone Encounter (Signed)
Already established with Dr Harrell Gave.  Please call and give appointment for new coumadin with [facility] office

## 2020-03-21 ENCOUNTER — Telehealth (INDEPENDENT_AMBULATORY_CARE_PROVIDER_SITE_OTHER): Payer: Medicare Other | Admitting: Cardiology

## 2020-03-21 ENCOUNTER — Encounter: Payer: Self-pay | Admitting: Cardiology

## 2020-03-21 ENCOUNTER — Telehealth: Payer: Self-pay

## 2020-03-21 VITALS — BP 134/70 | Ht 60.0 in | Wt 154.0 lb

## 2020-03-21 DIAGNOSIS — Z952 Presence of prosthetic heart valve: Secondary | ICD-10-CM | POA: Diagnosis not present

## 2020-03-21 DIAGNOSIS — I2584 Coronary atherosclerosis due to calcified coronary lesion: Secondary | ICD-10-CM | POA: Diagnosis not present

## 2020-03-21 DIAGNOSIS — I255 Ischemic cardiomyopathy: Secondary | ICD-10-CM | POA: Diagnosis not present

## 2020-03-21 DIAGNOSIS — Z7901 Long term (current) use of anticoagulants: Secondary | ICD-10-CM

## 2020-03-21 DIAGNOSIS — I2510 Atherosclerotic heart disease of native coronary artery without angina pectoris: Secondary | ICD-10-CM

## 2020-03-21 DIAGNOSIS — I480 Paroxysmal atrial fibrillation: Secondary | ICD-10-CM | POA: Diagnosis not present

## 2020-03-21 NOTE — Progress Notes (Signed)
Virtual Visit via Video Note   This visit type was conducted due to national recommendations for restrictions regarding the COVID-19 Pandemic (e.g. social distancing) in an effort to limit this patient's exposure and mitigate transmission in our community.  Due to his co-morbid illnesses, this patient is at least at moderate risk for complications without adequate follow up.  This format is felt to be most appropriate for this patient at this time.  All issues noted in this document were discussed and addressed.  A limited physical exam was performed with this format.  Please refer to the patient's chart for his consent to telehealth for [facility].      The patient was identified using 2 identifiers.  Patient Location: Home Provider Location: Office/Clinic  Date:  03/21/2020   ID:  Tanner Ferguson, DOB 02-Jul-1927, MRN [id number]  PCP:  Wenda Low, MD  Cardiologist:  Buford Dresser, MD PhD  Referring MD: Wenda Low, MD   CC: follow up  History of Present Illness:    Tanner Ferguson is a 87 y.o. male with a hx of aortic stenosis s/p mechanical AVR (1995), CAD with MI 11/2017, ischemic cardiomyopathy, paroxysmal atrial fibrillation, obstructive sleep apnea, hypertension, hyperlipidemia  who is seen for follow up today. Please see my initial note from 03/09/18 for full summary of his cardiac history.  Additional pertinent information:  -seen by Dr. Vaughan Browner 09/28/18. Plan is for continued monitoring of the asbestosis given mild fibrosis, pleural plaques, and minimal restriction/diffusion defects on PFTs. Does not think imaging is typical for amiodarone toxicity, ok to continue. Possibly has mild asthma, planned for allergy referral. Started on AutoPAP for sleep apnea.  Today: 6 mos follow up visit today. Since our last visit, they were recommended to try primidone. Risk of interaction with coumadin was discussed. Patient's family was hesitant to start primidone as his INRs have  already been fluctuating. Have not started this medication yet. Considering consolidated care in the [facility] system. Has already reached out re: our coumadin clinic. Would prefer to be able to do at home/remotely to minimize risk of Covid.  Overall tremors have been the main concern. Doing well from a heart perspective. Breathing continues to be a challenge, fair but stable on current regimen. No LE edema. No bradycardia. Able to walk on the treadmill, use his exercise bicycle at home for limited times. Walk up and down stairs daily. No low blood pressures. No recent afib episodes.  Denies chest pain. No PND, orthopnea, LE edema or unexpected weight gain. No syncope or palpitations.  Past Medical History:  Diagnosis Date  . Asthma   . CAD (coronary artery disease)    s/p NSTEMI 9/19. Cath in [place], [place] 10/19. Severe CAD not amenable to PCI. Not CABG candidate  . CHF (congestive heart failure) (HCC)    EF 40-45% by echo due to iCM. EF 36% by Myoview 9/19  . Dementia ([place])   . Essential tremor   . Hyperlipidemia    Intolerant of statins  . Hypertension   . PAF (paroxysmal atrial fibrillation) ([place])   . Pancytopenia ([place])    Follows with hematology  . Prostate cancer ([place])   . Seizure ([place])     Current Medications: Current Outpatient Medications on File Prior to Visit  Medication Sig  . albuterol (VENTOLIN HFA) 108 (90 Base) MCG/ACT inhaler Inhale 1-2 puffs into the lungs every 4 (four) hours as needed for wheezing or shortness of breath.   Marland Kitchen amiodarone (PACERONE) 200 MG tablet Take  1/2 (one-half) tablet by mouth once daily  . aspirin EC 81 MG tablet Take 1 tablet (81 mg total) by mouth daily with breakfast.  . primidone (MYSOLINE) 50 MG tablet Take 1 tablet (50 mg total) by mouth in the morning and at bedtime.  . sodium chloride (OCEAN) 0.65 % SOLN nasal spray Place 1 spray into both nostrils as needed for congestion.  . SYMBICORT 80-4.5 MCG/ACT inhaler Inhale 2 puffs by mouth twice  daily  . tamsulosin (FLOMAX) 0.4 MG CAPS capsule Take 0.4 mg by mouth daily after supper.   . warfarin (COUMADIN) 3 MG tablet Take 3 mg by mouth daily.  Marland Kitchen ipratropium (ATROVENT) 0.03 % nasal spray Place 2 sprays into both nostrils every 12 (twelve) hours. (Patient not taking: Reported on 03/21/2020)  . rosuvastatin (CRESTOR) 20 MG tablet Take 1 tablet (20 mg total) by mouth every evening.   No current facility-administered medications on file prior to visit.     Allergies:   Penicillins   Social History   Tobacco Use  . Smoking status: Never Smoker  . Smokeless tobacco: Former Systems developer    Types: Secondary school teacher  . Vaping Use: Never used  Substance Use Topics  . Alcohol use: Not Currently    Family History: No FH of premature CAD  ROS:   Please see the history of present illness.  Additional pertinent ROS otherwise unremarkable.  EKGs/Labs/Other Studies Reviewed:    The following studies were reviewed today: Recent pulmonary notes/studies  EKG:  EKG is personally reviewed.  The ekg ordered 09/20/19 demonstrates sinus bradycardia at 51 bpm  Recent Labs: No results found for requested labs within last 8760 hours.  Recent Lipid Panel No results found for: CHOL, TRIG, HDL, CHOLHDL, VLDL, LDLCALC, LDLDIRECT  Physical Exam:    VS:  BP 134/70 (BP Location: Left Arm)   Ht 5' (1.524 m)   Wt 154 lb (69.9 kg)   BMI 30.08 kg/m     Wt Readings from Last 3 Encounters:  03/21/20 154 lb (69.9 kg)  10/29/19 158 lb (71.7 kg)  09/20/19 158 lb (71.7 kg)    VITAL SIGNS:  reviewed GEN:  no acute distress EYES:  sclerae anicteric, EOMI - Extraocular Movements Intact RESPIRATORY:  normal respiratory effort, symmetric expansion CARDIOVASCULAR:  no visible JVD SKIN:  no rash, lesions or ulcers. MUSCULOSKELETAL:  no obvious deformities. NEURO:  alert and oriented x 3, no obvious focal deficit PSYCH:  normal affect  ASSESSMENT:    1. Paroxysmal atrial fibrillation (HCC)   2. History  of mechanical aortic valve replacement   3. Anticoagulant long-term use   4. Ischemic cardiomyopathy   5. Coronary artery disease due to calcified coronary lesion    PLAN:    Shortness of breath, wheezing: improved -recent echo remarkably normal. Small area of scar noted, but normal global EF, normal filling pressures (normal IVC, normal RVSP, E/e' <15), normal mechanical aortic valve gradient. Suggests not a cardiac etiology -symptoms much improved on his current pulmonary regimen and with gradual conditioning -reviewed recent pulmonary notes and recommendations as well  Paroxysmal atrial fibrillation -very symptomatic when in afib RVR, required 2 cardioversions 2019 -coumadin monitored by Dr. Glenna Durand office. They are debating about the best way to manage his INRs, have discussed with our coumadin clinic. They will keep [place] posted on what they decide -CHA2DS2/VAS Stroke Risk Points=4, but irrelevant as he needs to be on coumadin for his mechanical valve. -continue amiodarone. See prior extensive discussions regarding this,  especially given his pulmonary disease.  Prior chronic systolic heart failure, 2/2 ischemic cardiomyopathy. EF 40-45% before, now normalized on most recent echo -euvolemic -no beta blocker due to bradycardia -have tried ACEi and ARB. Now on hold 2/2 prior AKI and hypotension. -given baseline elevated potassium, no room for spironolactone  CAD s/p NSTEMI, with diffuse severe disease seen on cath -continue aspirin -continuerosuvastatin -resting sinus bradycardia. No room for beta blocker -medical management  History of mechanical AVR, on coumadin -continue coumadin and aspirin -SBE prophylaxis with dental procedures, but has dentures in.  Plan for follow up: 6 mos or sooner as needed  Today, I have spent 28 minutes with the patient with telehealth technology discussing the above problems.  Additional time spent in chart review, documentation, and  communication.  Medication Adjustments/Labs and Tests Ordered: Current medicines are reviewed at length with the patient today.  Concerns regarding medicines are outlined above.  No orders of the defined types were placed in this encounter.  No orders of the defined types were placed in this encounter.   Patient Instructions  Medication Instructions:  Your Physician recommend you continue on your current medication as directed.    *If you need a refill on your cardiac medications before your next appointment, please call your pharmacy*   Lab Work: None   Testing/Procedures: None   Follow-Up: At [facility], you and your health needs are our priority.  As part of our continuing mission to provide you with exceptional heart care, we have created designated Provider Care Teams.  These Care Teams include your primary Cardiologist (physician) and Advanced Practice Providers (APPs -  Physician Assistants and Nurse Practitioners) who all work together to provide you with the care you need, when you need it.  We recommend signing up for the patient portal called "MyChart".  Sign up information is provided on this After Visit Summary.  MyChart is used to connect with patients for Virtual Visits (Telemedicine).  Patients are able to view lab/test results, encounter notes, upcoming appointments, etc.  Non-urgent messages can be sent to your provider as well.   To learn more about what you can do with MyChart, go to [email or web address].    Your next appointment:   6 month(s)  The format for your next appointment:   In Person  Provider:   Buford Dresser, MD      Signed, Buford Dresser, MD PhD 03/21/2020     [place]

## 2020-03-21 NOTE — Patient Instructions (Signed)

## 2020-03-21 NOTE — Telephone Encounter (Signed)
-----   Message from [place], [place] sent at 03/21/2020  3:18 PM EST ----- Some how I accidentally deleted this  ----- Message ----- From: Meryl Crutch, RN Sent: 03/21/2020   3:11 PM EST To: Cv Div Nl Anticoag  Pt is wanting to establish coumadin management with our office but questioning if there is an option for remote monitoring?   Lars Mage, RN

## 2020-03-21 NOTE — Telephone Encounter (Signed)
I spoke to the patient's son Tanner Ferguson) about transitioning the patient's Coumadin follow up from his PCP to [place].  He is presently doing self testing through [place] monitoring.  He is also going to reach out to a Geriatric service to see if they have any requirements for Coumadin follow up.  I told him that once we both received clarification, we would help to coordinate.  He verbalized understanding.

## 2020-03-31 DIAGNOSIS — I4821 Permanent atrial fibrillation: Secondary | ICD-10-CM | POA: Diagnosis not present

## 2020-03-31 DIAGNOSIS — Z7901 Long term (current) use of anticoagulants: Secondary | ICD-10-CM | POA: Diagnosis not present

## 2020-04-08 ENCOUNTER — Ambulatory Visit: Payer: Medicare Other | Admitting: Orthopedic Surgery

## 2020-04-11 NOTE — Telephone Encounter (Signed)
I spoke to the patient's son and scheduled Coumadin appointment for 2/23.  They want [place] to monitor INR.

## 2020-04-11 NOTE — Telephone Encounter (Signed)
Called Tanner Ferguson to schedule pt for New Coumadin appt and he stated he would like a call from a provider in the coumadin clinic to answer his questions before scheduling an appointment. Please advise.

## 2020-04-15 ENCOUNTER — Encounter: Payer: Self-pay | Admitting: Cardiology

## 2020-04-18 ENCOUNTER — Telehealth: Payer: Self-pay | Admitting: Neurology

## 2020-04-18 NOTE — Telephone Encounter (Signed)
Fyi.

## 2020-04-18 NOTE — Telephone Encounter (Signed)
Patient's son called to cancel upcoming appointment with Dr. Carles Collet because the patient is not taking the medication, primidone. He has too much going on with his other health problems at this time. FYI only.

## 2020-04-23 ENCOUNTER — Ambulatory Visit (INDEPENDENT_AMBULATORY_CARE_PROVIDER_SITE_OTHER): Payer: Medicare Other

## 2020-04-23 ENCOUNTER — Other Ambulatory Visit: Payer: Self-pay

## 2020-04-23 ENCOUNTER — Telehealth: Payer: Self-pay

## 2020-04-23 DIAGNOSIS — Z952 Presence of prosthetic heart valve: Secondary | ICD-10-CM

## 2020-04-23 DIAGNOSIS — Z5181 Encounter for therapeutic drug level monitoring: Secondary | ICD-10-CM | POA: Diagnosis not present

## 2020-04-23 DIAGNOSIS — Z7901 Long term (current) use of anticoagulants: Secondary | ICD-10-CM | POA: Diagnosis not present

## 2020-04-23 DIAGNOSIS — I480 Paroxysmal atrial fibrillation: Secondary | ICD-10-CM | POA: Diagnosis not present

## 2020-04-23 LAB — POCT INR: INR: 3.3 — AB (ref 2.0–3.0)

## 2020-04-23 MED ORDER — WARFARIN SODIUM 2.5 MG PO TABS: ORAL_TABLET | ORAL | 1 refills | Status: DC

## 2020-04-23 NOTE — Telephone Encounter (Signed)
Orders faxed to [facility] for home inr monitoring

## 2020-04-23 NOTE — Patient Instructions (Signed)
Take 1 tablet (2.5 mg) Daily except 0.5 tablet on Monday and Friday.  Repeat INR 1 week.

## 2020-04-25 ENCOUNTER — Ambulatory Visit: Payer: Medicare Other | Admitting: Neurology

## 2020-04-28 ENCOUNTER — Ambulatory Visit (INDEPENDENT_AMBULATORY_CARE_PROVIDER_SITE_OTHER): Payer: Medicare Other | Admitting: Internal Medicine

## 2020-04-28 DIAGNOSIS — Z7901 Long term (current) use of anticoagulants: Secondary | ICD-10-CM | POA: Diagnosis not present

## 2020-04-28 DIAGNOSIS — Z5181 Encounter for therapeutic drug level monitoring: Secondary | ICD-10-CM | POA: Diagnosis not present

## 2020-04-28 DIAGNOSIS — Z952 Presence of prosthetic heart valve: Secondary | ICD-10-CM | POA: Diagnosis not present

## 2020-04-28 DIAGNOSIS — I4821 Permanent atrial fibrillation: Secondary | ICD-10-CM | POA: Diagnosis not present

## 2020-04-28 LAB — POCT INR: INR: 3.6 — AB (ref 2.0–3.0)

## 2020-05-02 ENCOUNTER — Ambulatory Visit (INDEPENDENT_AMBULATORY_CARE_PROVIDER_SITE_OTHER): Payer: Medicare Other | Admitting: Cardiology

## 2020-05-02 DIAGNOSIS — Z7901 Long term (current) use of anticoagulants: Secondary | ICD-10-CM

## 2020-05-02 DIAGNOSIS — Z5181 Encounter for therapeutic drug level monitoring: Secondary | ICD-10-CM

## 2020-05-02 DIAGNOSIS — Z952 Presence of prosthetic heart valve: Secondary | ICD-10-CM

## 2020-05-02 LAB — POCT INR: INR: 3.8 — AB (ref 2.0–3.0)

## 2020-05-05 ENCOUNTER — Ambulatory Visit (INDEPENDENT_AMBULATORY_CARE_PROVIDER_SITE_OTHER): Payer: Medicare Other | Admitting: Cardiology

## 2020-05-05 DIAGNOSIS — Z952 Presence of prosthetic heart valve: Secondary | ICD-10-CM | POA: Diagnosis not present

## 2020-05-05 DIAGNOSIS — Z7901 Long term (current) use of anticoagulants: Secondary | ICD-10-CM | POA: Diagnosis not present

## 2020-05-05 LAB — POCT INR: INR: 3.1 — AB (ref 2.0–3.0)

## 2020-05-12 ENCOUNTER — Telehealth: Payer: Self-pay

## 2020-05-12 ENCOUNTER — Ambulatory Visit (INDEPENDENT_AMBULATORY_CARE_PROVIDER_SITE_OTHER): Payer: Medicare Other | Admitting: Internal Medicine

## 2020-05-12 DIAGNOSIS — Z952 Presence of prosthetic heart valve: Secondary | ICD-10-CM | POA: Diagnosis not present

## 2020-05-12 DIAGNOSIS — Z7901 Long term (current) use of anticoagulants: Secondary | ICD-10-CM

## 2020-05-12 LAB — POCT INR: INR: 3.6 — AB (ref 2.0–3.0)

## 2020-05-12 NOTE — Telephone Encounter (Signed)
I spoke to the pt's son Dominica Severin) and discussed INR 3.6 result/recommendation, no changes. Verbalized understanding.

## 2020-05-19 ENCOUNTER — Ambulatory Visit (INDEPENDENT_AMBULATORY_CARE_PROVIDER_SITE_OTHER): Payer: Medicare Other | Admitting: Internal Medicine

## 2020-05-19 DIAGNOSIS — Z952 Presence of prosthetic heart valve: Secondary | ICD-10-CM | POA: Diagnosis not present

## 2020-05-19 DIAGNOSIS — Z7901 Long term (current) use of anticoagulants: Secondary | ICD-10-CM

## 2020-05-19 DIAGNOSIS — I4891 Unspecified atrial fibrillation: Secondary | ICD-10-CM

## 2020-05-19 LAB — POCT INR: INR: 2.7 (ref 2.0–3.0)

## 2020-05-20 ENCOUNTER — Telehealth: Payer: Self-pay

## 2020-05-20 NOTE — Telephone Encounter (Signed)
[place] spoke w/pt's son regarding the fact that [employer] will need hem to submit a power of attorney for them to be able to speak w/pt's son. They voiced understanding

## 2020-05-26 ENCOUNTER — Ambulatory Visit (INDEPENDENT_AMBULATORY_CARE_PROVIDER_SITE_OTHER): Payer: Medicare Other | Admitting: Cardiology

## 2020-05-26 DIAGNOSIS — Z7901 Long term (current) use of anticoagulants: Secondary | ICD-10-CM | POA: Diagnosis not present

## 2020-05-26 DIAGNOSIS — Z952 Presence of prosthetic heart valve: Secondary | ICD-10-CM

## 2020-05-26 LAB — POCT INR: INR: 2.7 (ref 2.0–3.0)

## 2020-05-27 DIAGNOSIS — I4821 Permanent atrial fibrillation: Secondary | ICD-10-CM | POA: Diagnosis not present

## 2020-05-27 DIAGNOSIS — Z7901 Long term (current) use of anticoagulants: Secondary | ICD-10-CM | POA: Diagnosis not present

## 2020-06-02 ENCOUNTER — Encounter: Payer: Self-pay | Admitting: Pulmonary Disease

## 2020-06-02 ENCOUNTER — Other Ambulatory Visit: Payer: Self-pay

## 2020-06-02 ENCOUNTER — Telehealth: Payer: Self-pay | Admitting: Pulmonary Disease

## 2020-06-02 ENCOUNTER — Ambulatory Visit (INDEPENDENT_AMBULATORY_CARE_PROVIDER_SITE_OTHER): Payer: Medicare Other | Admitting: Pulmonary Disease

## 2020-06-02 VITALS — BP 130/72 | HR 58 | Temp 97.3°F | Ht 65.0 in | Wt 153.6 lb

## 2020-06-02 DIAGNOSIS — J61 Pneumoconiosis due to asbestos and other mineral fibers: Secondary | ICD-10-CM | POA: Diagnosis not present

## 2020-06-02 DIAGNOSIS — I255 Ischemic cardiomyopathy: Secondary | ICD-10-CM | POA: Diagnosis not present

## 2020-06-02 DIAGNOSIS — G4733 Obstructive sleep apnea (adult) (pediatric): Secondary | ICD-10-CM

## 2020-06-02 LAB — POCT INR: INR: 2.7 (ref 2.0–3.0)

## 2020-06-02 MED ORDER — PREDNISONE 20 MG PO TABS: 40.0000 mg | ORAL_TABLET | Freq: Every day | ORAL | 0 refills | Status: DC

## 2020-06-02 MED ORDER — AZITHROMYCIN 250 MG PO TABS: ORAL_TABLET | ORAL | 0 refills | Status: DC

## 2020-06-02 NOTE — Progress Notes (Signed)
Tanner Ferguson    [id number]    07-Mar-1927  Primary Care Physician:Husain, Denton Ar, MD  Referring Physician: Wenda Low, MD [place] [address] [employer] [address],  [place] [address]  Chief complaint: Follow-up for asbestosis, OSA  HPI: 87 year old with history of atrial fibrillation, coronary artery disease, CHF, asbestos exposure in the past, aortic valve replacement, prostate cancer.  Complains of dyspnea with wheezing for the past several months.  He had been initially started on Pulmicort inhaler and then switched to Symbicort.  Treated with doxycycline and prednisone in June 2020 Reports improvement in symptoms.  States that breathing is much improved.  Denies any cough, sputum production.  Has chronic dyspnea on exertion PFTs show mild restriction and has been referred here for evaluation of interstitial lung disease.   Previously evaluated with [place] in 2016 with CT scan and pulmonary function test which showed pleural plaques, no pulmonary fibrosis and mild restriction.  Has history of MI in September 2019.  He started on amiodarone in October 2019 for atrial fibrillation.  He also has a mechanical aortic valve and is on chronic Coumadin anticoagulation.  Pets: No pets Occupation: Used to work in plumbing, heating and cooling.  Retired in 2014  Exposures, ILD questionnaire 09/05/2018: Exposure to asbestos.  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 No mold, hot tub, Jacuzzi  Smoking history: Never smoker Travel history: Lived in [place] for most of his life. Relevant family history: No significant family history of lung disease  Interim history: Complains of worsening dyspnea over the past year Has cough with congestion for which she received a course of antibiotics from primary care a month ago  Continues to have cough with brown mucus.  Has worsening dementia, essential tremors.  He is  followed by neurology.  No treatment at present due to concern for age, frailty and side effect of meds  Outpatient Encounter Medications as of 06/02/2020  Medication Sig  . albuterol (VENTOLIN HFA) 108 (90 Base) MCG/ACT inhaler Inhale 1-2 puffs into the lungs every 4 (four) hours as needed for wheezing or shortness of breath.   Marland Kitchen amiodarone (PACERONE) 200 MG tablet Take 1/2 (one-half) tablet by mouth once daily  . aspirin EC 81 MG tablet Take 1 tablet (81 mg total) by mouth daily with breakfast.  . ipratropium (ATROVENT) 0.03 % nasal spray Place 2 sprays into both nostrils every 12 (twelve) hours.  . primidone (MYSOLINE) 50 MG tablet Take 1 tablet (50 mg total) by mouth in the morning and at bedtime.  . sodium chloride (OCEAN) 0.65 % SOLN nasal spray Place 1 spray into both nostrils as needed for congestion.  . SYMBICORT 80-4.5 MCG/ACT inhaler Inhale 2 puffs by mouth twice daily  . tamsulosin (FLOMAX) 0.4 MG CAPS capsule Take 0.4 mg by mouth daily after supper.   . warfarin (COUMADIN) 2.5 MG tablet Take 0.5-1 tablet daily or as prescribed by Coumadin Clinic  . rosuvastatin (CRESTOR) 20 MG tablet Take 1 tablet (20 mg total) by mouth every evening.   No facility-administered encounter medications on file as of 06/02/2020.   Physical Exam: Blood pressure 130/72, pulse (!) 58, temperature (!) 97.3 F (36.3 C), temperature source Temporal, height 5\' 5"  (1.651 m), weight 153 lb 9.6 oz (69.7 kg), SpO2 98 %. Gen:      No acute distress HEENT:  EOMI, sclera anicteric Neck:     No masses; no thyromegaly Lungs:  Clear to auscultation bilaterally; normal respiratory effort CV:         Regular rate and rhythm; no murmurs Abd:      + bowel sounds; soft, non-tender; no palpable masses, no distension Ext:    No edema; adequate peripheral perfusion Skin:      Warm and dry; no rash Neuro: alert and oriented x 3 Psych: normal mood and affect  Data Reviewed: Imaging: CT chest 07/30/2014-calcified pleural  plaques with no clear evidence of fibrosis, cardiomegaly.  CT high-resolution 09/08/2018- calcified pleural plaques.  Mild bibasal pulmonary fibrosis with no traction bronchiectasis or honeycombing.  Probable UIP pattern.  I reviewed the images personally.  PFTs: 01/11/2015 ([facility]) -FVC 91%, FEV 102%, F/F 75%. Lung volumes show mild restriction.  Diffusion capacity is normal.  08/09/2018 FVC 1.94 (76%), FEV1 1.50 [89%), F/F 77, TLC 4.06 [71%], DLCO 13.62 [72%], DLCO/VA 109% Minimal restriction and minimal diffusion defect which corrects alveolar volume.  Labs: CBC 09/05/2018-WBC 3.3, eos 5.2%, absolute eosinophil count 1716 Blood allergy profile 09/05/2018- IgE 62, RAST panel negative  Sleep PSG [facility])  09/13/2013- positive for clinically significant sleep disordered breathing.  Download 05/29/20 100% compliant, average use 7 and half hours AutoSet 5-15, AHI 25.3, central 22  Cardiac Echo 11/04/2017-EF 40-45%, concentric LVH, mild chronic aortic valve with trivial AR. Echo 11/08/2018- EF 60-65%.  Normal RV systolic function.  Mechanical aortic valve in place.  Assessment:  Asbestosis Patient has history of asbestos exposure.  CT is reviewed with very mild fibrosis and pleural plaques He has minimal restriction and diffusion defect on lung function tests  There is a concern for amiodarone toxicity but the imaging is not typical.  Do not feel we need to stop amiodarone. Per cardiology discussion he does not have very good alternatives in terms of antiarrhythmics  Now with worsening dyspnea he will need reevaluation.  He has been inactive recently and presentation may be due to deconditioning Schedule high-resolution CT, PFTs  Mild asthma Likely has mild asthma, reactive airway disease Has symptoms of bronchitis now Give Z-Pak and prednisone 40 mg a day for 5 days Continue Symbicort  Sleep apnea Sleep study with mild apnea Continues on AutoSet CPAP with AHI of 25.3 most of  which is central He is using nasal pillows Check overnight oximetry on CPAP to make sure there are no desaturations  Health maintenance Flu and Pneumovax today.  Plan/Recommendations: - HRCT, PFTs - Z-Pak, prednisone - Continue Symbicort - AutoSet CPAP 5-15.  Check overnight oximetry on CPAP   Marshell Garfinkel MD [facility] 06/02/2020, 11:37 AM  CC: Wenda Low, MD

## 2020-06-02 NOTE — Patient Instructions (Signed)
We will give you a Z-Pak and prednisone 40 mg a day for 5 days Schedule high-resolution CT, PFTs Check overnight oximetry Follow-up in 1 to 2 months.

## 2020-06-02 NOTE — Telephone Encounter (Signed)
I called and spoke with the pharmacy and they are aware to fill the medication for the zpak per PM.

## 2020-06-02 NOTE — Addendum Note (Signed)
Addended by: Gavin Potters R on: 06/02/2020 12:17 PM   Modules accepted: Orders

## 2020-06-03 ENCOUNTER — Ambulatory Visit (INDEPENDENT_AMBULATORY_CARE_PROVIDER_SITE_OTHER): Payer: Medicare Other | Admitting: Cardiovascular Disease

## 2020-06-03 ENCOUNTER — Other Ambulatory Visit (HOSPITAL_BASED_OUTPATIENT_CLINIC_OR_DEPARTMENT_OTHER): Payer: Medicare Other

## 2020-06-03 DIAGNOSIS — Z7901 Long term (current) use of anticoagulants: Secondary | ICD-10-CM

## 2020-06-03 DIAGNOSIS — Z952 Presence of prosthetic heart valve: Secondary | ICD-10-CM

## 2020-06-03 DIAGNOSIS — I4891 Unspecified atrial fibrillation: Secondary | ICD-10-CM

## 2020-06-04 ENCOUNTER — Ambulatory Visit: Payer: Medicare Other | Admitting: Pulmonary Disease

## 2020-06-09 ENCOUNTER — Ambulatory Visit (INDEPENDENT_AMBULATORY_CARE_PROVIDER_SITE_OTHER): Payer: Medicare Other | Admitting: Pharmacist Clinician (PhC)/ Clinical Pharmacy Specialist

## 2020-06-09 DIAGNOSIS — R32 Unspecified urinary incontinence: Secondary | ICD-10-CM | POA: Diagnosis not present

## 2020-06-09 DIAGNOSIS — E871 Hypo-osmolality and hyponatremia: Secondary | ICD-10-CM | POA: Diagnosis not present

## 2020-06-09 DIAGNOSIS — Z7901 Long term (current) use of anticoagulants: Secondary | ICD-10-CM | POA: Diagnosis not present

## 2020-06-09 DIAGNOSIS — I4891 Unspecified atrial fibrillation: Secondary | ICD-10-CM | POA: Diagnosis not present

## 2020-06-09 DIAGNOSIS — I5020 Unspecified systolic (congestive) heart failure: Secondary | ICD-10-CM | POA: Diagnosis not present

## 2020-06-09 DIAGNOSIS — Z952 Presence of prosthetic heart valve: Secondary | ICD-10-CM

## 2020-06-09 DIAGNOSIS — I2510 Atherosclerotic heart disease of native coronary artery without angina pectoris: Secondary | ICD-10-CM | POA: Diagnosis not present

## 2020-06-09 DIAGNOSIS — I480 Paroxysmal atrial fibrillation: Secondary | ICD-10-CM | POA: Diagnosis not present

## 2020-06-09 DIAGNOSIS — I129 Hypertensive chronic kidney disease with stage 1 through stage 4 chronic kidney disease, or unspecified chronic kidney disease: Secondary | ICD-10-CM | POA: Diagnosis not present

## 2020-06-09 DIAGNOSIS — N183 Chronic kidney disease, stage 3 unspecified: Secondary | ICD-10-CM | POA: Diagnosis not present

## 2020-06-09 DIAGNOSIS — G250 Essential tremor: Secondary | ICD-10-CM | POA: Diagnosis not present

## 2020-06-09 LAB — POCT INR: INR: 3.5 — AB (ref 2.0–3.0)

## 2020-06-13 ENCOUNTER — Ambulatory Visit: Payer: Medicare Other | Attending: Internal Medicine

## 2020-06-13 ENCOUNTER — Ambulatory Visit (HOSPITAL_BASED_OUTPATIENT_CLINIC_OR_DEPARTMENT_OTHER)
Admission: RE | Admit: 2020-06-13 | Discharge: 2020-06-13 | Disposition: A | Discharge status: Home / Self Care | Payer: Medicare Other | Source: Ambulatory Visit | Attending: Pulmonary Disease | Admitting: Pulmonary Disease

## 2020-06-13 ENCOUNTER — Other Ambulatory Visit: Payer: Self-pay

## 2020-06-13 DIAGNOSIS — I712 Thoracic aortic aneurysm, without rupture: Secondary | ICD-10-CM | POA: Diagnosis not present

## 2020-06-13 DIAGNOSIS — J61 Pneumoconiosis due to asbestos and other mineral fibers: Secondary | ICD-10-CM | POA: Diagnosis not present

## 2020-06-13 DIAGNOSIS — J929 Pleural plaque without asbestos: Secondary | ICD-10-CM | POA: Diagnosis not present

## 2020-06-13 DIAGNOSIS — I2510 Atherosclerotic heart disease of native coronary artery without angina pectoris: Secondary | ICD-10-CM | POA: Diagnosis not present

## 2020-06-13 DIAGNOSIS — Z23 Encounter for immunization: Secondary | ICD-10-CM

## 2020-06-13 DIAGNOSIS — J8410 Pulmonary fibrosis, unspecified: Secondary | ICD-10-CM | POA: Diagnosis not present

## 2020-06-13 NOTE — Progress Notes (Signed)
   Covid-19 Vaccination Clinic  Name:  Tanner Ferguson    MRN: [id number] DOB: 22-Apr-1927  06/13/2020  Tanner Ferguson was observed post Covid-19 immunization for 15 minutes without incident. He was provided with Vaccine Information Sheet and instruction to access the V-Safe system.   Tanner Ferguson was instructed to call 911 with any severe reactions post vaccine: Marland Kitchen Difficulty breathing  . Swelling of face and throat  . A fast heartbeat  . A bad rash all over body  . Dizziness and weakness   Immunizations Administered    Name Date Dose VIS Date Route   PFIZER Comrnaty(Gray TOP) Covid-19 Vaccine 06/13/2020  1:34 PM 0.3 mL 02/07/2020 Intramuscular   Manufacturer: [employer]   Lot: [id number]   NDC: [phone]

## 2020-06-16 ENCOUNTER — Ambulatory Visit (INDEPENDENT_AMBULATORY_CARE_PROVIDER_SITE_OTHER): Payer: Medicare Other | Admitting: Cardiovascular Disease

## 2020-06-16 DIAGNOSIS — Z7901 Long term (current) use of anticoagulants: Secondary | ICD-10-CM

## 2020-06-16 DIAGNOSIS — I4821 Permanent atrial fibrillation: Secondary | ICD-10-CM | POA: Diagnosis not present

## 2020-06-16 DIAGNOSIS — Z952 Presence of prosthetic heart valve: Secondary | ICD-10-CM | POA: Diagnosis not present

## 2020-06-16 DIAGNOSIS — I4891 Unspecified atrial fibrillation: Secondary | ICD-10-CM | POA: Diagnosis not present

## 2020-06-16 LAB — POCT INR: INR: 2 (ref 2.0–3.0)

## 2020-06-19 ENCOUNTER — Other Ambulatory Visit (HOSPITAL_BASED_OUTPATIENT_CLINIC_OR_DEPARTMENT_OTHER): Payer: Self-pay

## 2020-06-19 DIAGNOSIS — R0683 Snoring: Secondary | ICD-10-CM | POA: Diagnosis not present

## 2020-06-19 DIAGNOSIS — G4730 Sleep apnea, unspecified: Secondary | ICD-10-CM | POA: Diagnosis not present

## 2020-06-19 MED ORDER — PFIZER-BIONT COVID-19 VAC-TRIS 30 MCG/0.3ML IM SUSP
INTRAMUSCULAR | 0 refills | Status: DC
  Filled 2020-06-19: qty 0.3, 1d supply, fill #0

## 2020-06-23 ENCOUNTER — Ambulatory Visit (INDEPENDENT_AMBULATORY_CARE_PROVIDER_SITE_OTHER): Payer: Medicare Other | Admitting: Cardiology

## 2020-06-23 ENCOUNTER — Telehealth: Payer: Self-pay | Admitting: Pulmonary Disease

## 2020-06-23 DIAGNOSIS — Z952 Presence of prosthetic heart valve: Secondary | ICD-10-CM | POA: Diagnosis not present

## 2020-06-23 DIAGNOSIS — Z7901 Long term (current) use of anticoagulants: Secondary | ICD-10-CM

## 2020-06-23 LAB — POCT INR: INR: 2.5 (ref 2.0–3.0)

## 2020-06-23 NOTE — Telephone Encounter (Signed)
Overnight oximetry on room air, on CPAP dated 06/19/2020  Time of study 5 hours 28 minutes Minimum O2 sat of 90.   No desaturations noted.  Please let the patient know that he does not require supplemental oxygen while on CPAP.

## 2020-06-23 NOTE — Telephone Encounter (Signed)
ONO results received via fax from [place]. ONO results placed in Dr. Matilde Bash review.

## 2020-06-23 NOTE — Telephone Encounter (Signed)
ATC Patient. LM to call back for ONO results.

## 2020-06-24 ENCOUNTER — Telehealth: Payer: Self-pay | Admitting: Pulmonary Disease

## 2020-06-24 NOTE — Telephone Encounter (Signed)
Received call from Patient's son Dominica Severin [facility]).  ONO results given and understanding stated. Dominica Severin requested HRCT results from 06/15/20.  Message routed to Dr. Vaughan Browner for HRCT results

## 2020-06-24 NOTE — Telephone Encounter (Signed)
ATC patient. LM to call back for results.

## 2020-06-24 NOTE — Telephone Encounter (Signed)
disregard

## 2020-06-25 NOTE — Telephone Encounter (Signed)
ATC, no answer, left VM 

## 2020-06-25 NOTE — Telephone Encounter (Signed)
CT shows stable scarring in lung and effects of asbestos exposure

## 2020-06-26 NOTE — Telephone Encounter (Signed)
Called and spoke with patients son Dominica Severin per DPR to go over CT results. He asked about getting patient scheduled for PFT. Patient is now scheduled for covid test on 06/27/20 and PFT on 07/01/20 at 12. Nothing further needed at this time.

## 2020-06-27 ENCOUNTER — Other Ambulatory Visit (HOSPITAL_COMMUNITY)
Admission: RE | Admit: 2020-06-27 | Discharge: 2020-06-27 | Disposition: A | Discharge status: Home / Self Care | Payer: Medicare Other | Source: Ambulatory Visit | Attending: Pulmonary Disease | Admitting: Pulmonary Disease

## 2020-06-27 DIAGNOSIS — Z20822 Contact with and (suspected) exposure to covid-19: Secondary | ICD-10-CM | POA: Diagnosis not present

## 2020-06-27 DIAGNOSIS — Z01812 Encounter for preprocedural laboratory examination: Secondary | ICD-10-CM | POA: Diagnosis not present

## 2020-06-27 LAB — SARS CORONAVIRUS 2 (TAT 6-24 HRS): SARS Coronavirus 2: NEGATIVE

## 2020-06-30 LAB — POCT INR: INR: 2.3 (ref 2.0–3.0)

## 2020-07-01 ENCOUNTER — Ambulatory Visit (INDEPENDENT_AMBULATORY_CARE_PROVIDER_SITE_OTHER): Payer: Medicare Other | Admitting: Cardiovascular Disease

## 2020-07-01 ENCOUNTER — Ambulatory Visit (INDEPENDENT_AMBULATORY_CARE_PROVIDER_SITE_OTHER): Payer: Medicare Other | Admitting: Pulmonary Disease

## 2020-07-01 ENCOUNTER — Other Ambulatory Visit: Payer: Self-pay

## 2020-07-01 DIAGNOSIS — J61 Pneumoconiosis due to asbestos and other mineral fibers: Secondary | ICD-10-CM | POA: Diagnosis not present

## 2020-07-01 DIAGNOSIS — Z7901 Long term (current) use of anticoagulants: Secondary | ICD-10-CM

## 2020-07-01 DIAGNOSIS — Z952 Presence of prosthetic heart valve: Secondary | ICD-10-CM | POA: Diagnosis not present

## 2020-07-01 LAB — PULMONARY FUNCTION TEST
DL/VA % pred: 110 %
DL/VA: 4.29 ml/min/mmHg/L
DLCO cor % pred: 84 %
DLCO cor: 15.48 ml/min/mmHg
DLCO unc % pred: 84 %
DLCO unc: 15.48 ml/min/mmHg
FEF 25-75 Post: 1.77 L/sec
FEF 25-75 Pre: 1.72 L/sec
FEF2575-%Change-Post: 2 %
FEF2575-%Pred-Post: 207 %
FEF2575-%Pred-Pre: 201 %
FEV1-%Change-Post: 0 %
FEV1-%Pred-Post: 107 %
FEV1-%Pred-Pre: 106 %
FEV1-Post: 1.71 L
FEV1-Pre: 1.71 L
FEV1FVC-%Change-Post: 1 %
FEV1FVC-%Pred-Pre: 113 %
FEV6-%Change-Post: 0 %
FEV6-%Pred-Post: 96 %
FEV6-%Pred-Pre: 97 %
FEV6-Post: 2.12 L
FEV6-Pre: 2.14 L
FEV6FVC-%Change-Post: 0 %
FEV6FVC-%Pred-Post: 111 %
FEV6FVC-%Pred-Pre: 110 %
FVC-%Change-Post: -1 %
FVC-%Pred-Post: 86 %
FVC-%Pred-Pre: 87 %
FVC-Post: 2.12 L
FVC-Pre: 2.16 L
Post FEV1/FVC ratio: 81 %
Post FEV6/FVC ratio: 100 %
Pre FEV1/FVC ratio: 79 %
Pre FEV6/FVC Ratio: 99 %

## 2020-07-01 NOTE — Progress Notes (Signed)
Spirometry pre and post Dlco done today. 

## 2020-07-07 ENCOUNTER — Ambulatory Visit (INDEPENDENT_AMBULATORY_CARE_PROVIDER_SITE_OTHER): Payer: Medicare Other | Admitting: Internal Medicine

## 2020-07-07 DIAGNOSIS — Z952 Presence of prosthetic heart valve: Secondary | ICD-10-CM | POA: Diagnosis not present

## 2020-07-07 DIAGNOSIS — Z7901 Long term (current) use of anticoagulants: Secondary | ICD-10-CM

## 2020-07-07 DIAGNOSIS — Z5181 Encounter for therapeutic drug level monitoring: Secondary | ICD-10-CM | POA: Diagnosis not present

## 2020-07-07 LAB — POCT INR: INR: 2.7 (ref 2.0–3.0)

## 2020-07-14 ENCOUNTER — Ambulatory Visit (INDEPENDENT_AMBULATORY_CARE_PROVIDER_SITE_OTHER): Payer: Medicare Other | Admitting: Cardiology

## 2020-07-14 DIAGNOSIS — Z7901 Long term (current) use of anticoagulants: Secondary | ICD-10-CM | POA: Diagnosis not present

## 2020-07-14 DIAGNOSIS — Z952 Presence of prosthetic heart valve: Secondary | ICD-10-CM | POA: Diagnosis not present

## 2020-07-14 LAB — POCT INR: INR: 2.5 (ref 2.0–3.0)

## 2020-07-21 ENCOUNTER — Ambulatory Visit (INDEPENDENT_AMBULATORY_CARE_PROVIDER_SITE_OTHER): Payer: Medicare Other | Admitting: Cardiology

## 2020-07-21 DIAGNOSIS — Z952 Presence of prosthetic heart valve: Secondary | ICD-10-CM

## 2020-07-21 DIAGNOSIS — Z7901 Long term (current) use of anticoagulants: Secondary | ICD-10-CM | POA: Diagnosis not present

## 2020-07-21 DIAGNOSIS — I480 Paroxysmal atrial fibrillation: Secondary | ICD-10-CM | POA: Diagnosis not present

## 2020-07-21 DIAGNOSIS — Z5181 Encounter for therapeutic drug level monitoring: Secondary | ICD-10-CM

## 2020-07-21 LAB — POCT INR: INR: 2.6 (ref 2.0–3.0)

## 2020-07-28 LAB — POCT INR: INR: 2.2 (ref 2.0–3.0)

## 2020-07-29 ENCOUNTER — Ambulatory Visit (INDEPENDENT_AMBULATORY_CARE_PROVIDER_SITE_OTHER): Payer: Medicare Other | Admitting: Cardiology

## 2020-07-29 DIAGNOSIS — Z7901 Long term (current) use of anticoagulants: Secondary | ICD-10-CM | POA: Diagnosis not present

## 2020-07-29 DIAGNOSIS — Z952 Presence of prosthetic heart valve: Secondary | ICD-10-CM | POA: Diagnosis not present

## 2020-08-04 ENCOUNTER — Ambulatory Visit (INDEPENDENT_AMBULATORY_CARE_PROVIDER_SITE_OTHER): Payer: Medicare Other | Admitting: Cardiology

## 2020-08-04 DIAGNOSIS — Z952 Presence of prosthetic heart valve: Secondary | ICD-10-CM | POA: Diagnosis not present

## 2020-08-04 DIAGNOSIS — Z7901 Long term (current) use of anticoagulants: Secondary | ICD-10-CM | POA: Diagnosis not present

## 2020-08-04 LAB — POCT INR: INR: 2.6 (ref 2.0–3.0)

## 2020-08-11 ENCOUNTER — Encounter: Payer: Self-pay | Admitting: *Deleted

## 2020-08-11 ENCOUNTER — Ambulatory Visit (INDEPENDENT_AMBULATORY_CARE_PROVIDER_SITE_OTHER): Payer: Medicare Other | Admitting: Cardiology

## 2020-08-11 DIAGNOSIS — Z7901 Long term (current) use of anticoagulants: Secondary | ICD-10-CM

## 2020-08-11 DIAGNOSIS — Z952 Presence of prosthetic heart valve: Secondary | ICD-10-CM

## 2020-08-11 LAB — POCT INR: INR: 2.6 (ref 2.0–3.0)

## 2020-08-18 ENCOUNTER — Ambulatory Visit (INDEPENDENT_AMBULATORY_CARE_PROVIDER_SITE_OTHER): Payer: Medicare Other | Admitting: Cardiology

## 2020-08-18 DIAGNOSIS — Z7901 Long term (current) use of anticoagulants: Secondary | ICD-10-CM | POA: Diagnosis not present

## 2020-08-18 DIAGNOSIS — I480 Paroxysmal atrial fibrillation: Secondary | ICD-10-CM | POA: Diagnosis not present

## 2020-08-18 DIAGNOSIS — Z952 Presence of prosthetic heart valve: Secondary | ICD-10-CM | POA: Diagnosis not present

## 2020-08-18 LAB — POCT INR: INR: 2.6 (ref 2.0–3.0)

## 2020-08-25 ENCOUNTER — Ambulatory Visit (INDEPENDENT_AMBULATORY_CARE_PROVIDER_SITE_OTHER): Payer: Medicare Other | Admitting: Cardiology

## 2020-08-25 DIAGNOSIS — Z952 Presence of prosthetic heart valve: Secondary | ICD-10-CM

## 2020-08-25 DIAGNOSIS — I4891 Unspecified atrial fibrillation: Secondary | ICD-10-CM

## 2020-08-25 DIAGNOSIS — Z7901 Long term (current) use of anticoagulants: Secondary | ICD-10-CM | POA: Diagnosis not present

## 2020-08-25 LAB — POCT INR: INR: 2.1 (ref 2.0–3.0)

## 2020-09-04 ENCOUNTER — Telehealth: Payer: Self-pay

## 2020-09-04 NOTE — Telephone Encounter (Signed)
Lmom for overdue inr self tester  

## 2020-09-08 ENCOUNTER — Ambulatory Visit (INDEPENDENT_AMBULATORY_CARE_PROVIDER_SITE_OTHER): Payer: Medicare Other | Admitting: Cardiology

## 2020-09-08 DIAGNOSIS — Z952 Presence of prosthetic heart valve: Secondary | ICD-10-CM

## 2020-09-08 DIAGNOSIS — Z7901 Long term (current) use of anticoagulants: Secondary | ICD-10-CM

## 2020-09-08 LAB — POCT INR: INR: 2.5 (ref 2.0–3.0)

## 2020-09-15 ENCOUNTER — Ambulatory Visit (INDEPENDENT_AMBULATORY_CARE_PROVIDER_SITE_OTHER): Payer: Medicare Other | Admitting: Cardiovascular Disease

## 2020-09-15 DIAGNOSIS — I480 Paroxysmal atrial fibrillation: Secondary | ICD-10-CM | POA: Diagnosis not present

## 2020-09-15 DIAGNOSIS — Z7901 Long term (current) use of anticoagulants: Secondary | ICD-10-CM

## 2020-09-15 DIAGNOSIS — Z952 Presence of prosthetic heart valve: Secondary | ICD-10-CM | POA: Diagnosis not present

## 2020-09-15 LAB — POCT INR: INR: 2.5 (ref 2.0–3.0)

## 2020-09-17 ENCOUNTER — Other Ambulatory Visit: Payer: Self-pay

## 2020-09-17 ENCOUNTER — Ambulatory Visit (INDEPENDENT_AMBULATORY_CARE_PROVIDER_SITE_OTHER): Payer: Medicare Other | Admitting: Cardiology

## 2020-09-17 VITALS — BP 110/58 | HR 52 | Ht 65.0 in | Wt 155.0 lb

## 2020-09-17 DIAGNOSIS — Z952 Presence of prosthetic heart valve: Secondary | ICD-10-CM | POA: Diagnosis not present

## 2020-09-17 DIAGNOSIS — Z7901 Long term (current) use of anticoagulants: Secondary | ICD-10-CM | POA: Diagnosis not present

## 2020-09-17 DIAGNOSIS — I255 Ischemic cardiomyopathy: Secondary | ICD-10-CM | POA: Diagnosis not present

## 2020-09-17 DIAGNOSIS — I2510 Atherosclerotic heart disease of native coronary artery without angina pectoris: Secondary | ICD-10-CM | POA: Diagnosis not present

## 2020-09-17 DIAGNOSIS — I2584 Coronary atherosclerosis due to calcified coronary lesion: Secondary | ICD-10-CM | POA: Diagnosis not present

## 2020-09-17 DIAGNOSIS — I480 Paroxysmal atrial fibrillation: Secondary | ICD-10-CM

## 2020-09-17 DIAGNOSIS — I4891 Unspecified atrial fibrillation: Secondary | ICD-10-CM

## 2020-09-17 NOTE — Patient Instructions (Signed)
Medication Instructions:  Your physician recommends that you continue on your current medications as directed. Please refer to the Current Medication list given to you today.  *If you need a refill on your cardiac medications before your next appointment, please call your pharmacy*   Lab Work: None orderd  If you have labs (blood work) drawn today and your tests are completely normal, you will receive your results only by: [place] (if you have MyChart) OR A paper copy in the mail If you have any lab test that is abnormal or we need to change your treatment, we will call you to review the results.   Testing/Procedures: None ordered   Follow-Up: At [facility], you and your health needs are our priority.  As part of our continuing mission to provide you with exceptional heart care, we have created designated Provider Care Teams.  These Care Teams include your primary Cardiologist (physician) and Advanced Practice Providers (APPs -  Physician Assistants and Nurse Practitioners) who all work together to provide you with the care you need, when you need it.  We recommend signing up for the patient portal called "MyChart".  Sign up information is provided on this After Visit Summary.  MyChart is used to connect with patients for Virtual Visits (Telemedicine).  Patients are able to view lab/test results, encounter notes, upcoming appointments, etc.  Non-urgent messages can be sent to your provider as well.   To learn more about what you can do with MyChart, go to [email or web address].    Your next appointment:   6 month(s)  The format for your next appointment:   In Person  Provider:   Buford Dresser, MD   Other Instructions

## 2020-09-17 NOTE — Progress Notes (Signed)
Cardiology Office Note   Date:  09/17/2020   ID:  Hadriel Northup, DOB Aug 07, 1927, MRN [id number]  PCP:  Wenda Low, MD  Cardiologist:  Buford Dresser, MD PhD  Referring MD: Wenda Low, MD   CC: follow up  History of Present Illness:    Tanner Ferguson is a 87 y.o. male with a hx of aortic stenosis s/p mechanical AVR (1995), CAD with MI 11/2017, ischemic cardiomyopathy, paroxysmal atrial fibrillation, obstructive sleep apnea, hypertension, hyperlipidemia  who is seen for follow up today. Please see my initial note from 03/09/18 for full summary of his cardiac history.  Additional pertinent information:  -seen by Dr. Vaughan Browner 09/28/18. Plan is for continued monitoring of the asbestosis given mild fibrosis, pleural plaques, and minimal restriction/diffusion defects on PFTs. Does not think imaging is typical for amiodarone toxicity, ok to continue. Possibly has mild asthma, planned for allergy referral. Started on AutoPAP for sleep apnea.  Today: He is accompanied by his son, who provides the majority of history. He is feeling "about as good as expected." Lately, his breathing has not been as good. His son reports his right LE edema seems to have subsided.   They believe his aspiration of food is worsening. His son notes this is indicated by more odorous breath than usual.  For exercise, he is using his exercise bike but no longer using the treadmill. His son believes he is uncomfortable with the treadmill due to worsening tremors. Also he is a little more unsteady on his feet. Of late he has not been exercising as often as he used to. However, he continues to walk up stairs several times a day for the bedroom and restroom. After climbing the stairs he is short of breath, and usually rests for a moment at the top.  For diet, he occasionally does not follow his diet plan. Typically his son tries to prepare more greens.  He denies any chest pain, or palpitations. No headaches,  lightheadedness, or syncope to report. Also has no orthopnea or PND.   Past Medical History:  Diagnosis Date   Asthma    CAD (coronary artery disease)    s/p NSTEMI 9/19. Cath in [place], [place] 10/19. Severe CAD not amenable to PCI. Not CABG candidate   CHF (congestive heart failure) (HCC)    EF 40-45% by echo due to iCM. EF 36% by Myoview 9/19   Dementia (HCC)    Essential tremor    Hyperlipidemia    Intolerant of statins   Hypertension    PAF (paroxysmal atrial fibrillation) ([place])    Pancytopenia ([place])    Follows with hematology   Prostate cancer ([place])    Seizure ([place])     Current Medications: Current Outpatient Medications on File Prior to Visit  Medication Sig   albuterol (VENTOLIN HFA) 108 (90 Base) MCG/ACT inhaler Inhale 1-2 puffs into the lungs every 4 (four) hours as needed for wheezing or shortness of breath.    amiodarone (PACERONE) 200 MG tablet Take 1/2 (one-half) tablet by mouth once daily   aspirin EC 81 MG tablet Take 1 tablet (81 mg total) by mouth daily with breakfast.   azithromycin (ZITHROMAX Z-PAK) 250 MG tablet Take 2 tabs today, then 1 tab until gone   COVID-19 mRNA Vac-TriS, Pfizer, (PFIZER-BIONT COVID-19 VAC-TRIS) SUSP injection Inject into the muscle.   ipratropium (ATROVENT) 0.03 % nasal spray Place 2 sprays into both nostrils every 12 (twelve) hours.   predniSONE (DELTASONE) 20 MG tablet Take 2 tablets (40 mg  total) by mouth daily. For 5 days   primidone (MYSOLINE) 50 MG tablet Take 1 tablet (50 mg total) by mouth in the morning and at bedtime.   rosuvastatin (CRESTOR) 20 MG tablet Take 1 tablet (20 mg total) by mouth every evening.   sodium chloride (OCEAN) 0.65 % SOLN nasal spray Place 1 spray into both nostrils as needed for congestion.   SYMBICORT 80-4.5 MCG/ACT inhaler Inhale 2 puffs by mouth twice daily   tamsulosin (FLOMAX) 0.4 MG CAPS capsule Take 0.4 mg by mouth daily after supper.    warfarin (COUMADIN) 2.5 MG tablet Take 0.5-1 tablet daily or as  prescribed by [facility]   No current facility-administered medications on file prior to visit.     Allergies:   Penicillins   Social History   Tobacco Use   Smoking status: Never   Smokeless tobacco: Former    Types: Chew    Quit date: 10/25/2017  Vaping Use   Vaping Use: Never used  Substance Use Topics   Alcohol use: Not Currently    Family History: No FH of premature CAD  ROS:   Please see the history of present illness.   (+) Shortness of breath (+) Aspiration (+) Tremors (+) Gait instability Additional pertinent ROS otherwise unremarkable.  EKGs/Labs/Other Studies Reviewed:    The following studies were reviewed today: Recent pulmonary notes/studies  Echo 11/08/2018:  1. The left ventricle has normal systolic function with an ejection  fraction of 60-65%. The cavity size was normal. There is mildly increased  left ventricular wall thickness with severe basal septal hypertrophy.  There is apical and apical anterior  akinesis.   2. The right ventricle has normal systolic function. The cavity was  mildly enlarged. There is no increase in right ventricular wall thickness.   3. A Mechanical AVR valve is present in the aortic position. Procedure  Date: 1995 Normal aortic valve prosthesis. The prosthesis is not well  visualized. The mean AVG is 94mmHg. There is trivial perivalvular AI.   4. There is mild to moderate mitral annular calcification present and  mild MR.   5. There is mild dilatation of the ascending aorta measuring 40 mm.   6. Right atrial size was mildly dilated.   7. Left atrial size was moderately dilated.   EKG:  EKG is personally reviewed.   09/17/2020: Sinus bradycardia with 1st degree AV block, 52 bpm 09/20/19: sinus bradycardia at 51 bpm  Recent Labs: No results found for requested labs within last 8760 hours.  Recent Lipid Panel No results found for: CHOL, TRIG, HDL, CHOLHDL, VLDL, LDLCALC, LDLDIRECT  Physical Exam:    VS:  BP (!)  110/58   Pulse (!) 52   Ht 5\' 5"  (1.651 m)   Wt 155 lb (70.3 kg)   BMI 25.79 kg/m     Wt Readings from Last 3 Encounters:  09/17/20 155 lb (70.3 kg)  06/02/20 153 lb 9.6 oz (69.7 kg)  03/21/20 154 lb (69.9 kg)    GEN: Well nourished, well developed in no acute distress HEENT: Normal, moist mucous membranes NECK: No JVD CARDIAC: regular rhythm, normal S1 and crisp mechanical S2, no rubs or gallops. 1/6 systolic murmur. VASCULAR: Radial and DP pulses 2+ bilaterally. No carotid bruits RESPIRATORY:  Upper airways clear with bilateral velcro sounds at base.  ABDOMEN: Soft, non-tender, non-distended MUSCULOSKELETAL:  Ambulates independently SKIN: Warm and dry, no edema NEUROLOGIC:  Alert and oriented x 3. No focal neuro deficits noted. Has bilateral tremor  PSYCHIATRIC:  Normal affect    ASSESSMENT:    1. History of mechanical aortic valve replacement   2. Paroxysmal atrial fibrillation (HCC)   3. Anticoagulant long-term use   4. Ischemic cardiomyopathy   5. Coronary artery disease due to calcified coronary lesion     PLAN:    Shortness of breath, wheezing: stable -recent echo remarkably normal. Small area of scar noted, but normal global EF, normal filling pressures (normal IVC, normal RVSP, E/e' <15), normal mechanical aortic valve gradient. Suggests not a cardiac etiology -appreciate pulmonology management -encouraged exercise as tolerated   Paroxysmal atrial fibrillation -very symptomatic when in afib RVR, required 2 cardioversions 2019 -CHA2DS2/VAS Stroke Risk Points=4, but irrelevant as he needs to be on coumadin for his mechanical valve. -continue amiodarone. See prior extensive discussions regarding this, especially given his pulmonary disease. -followed by coumadin clinic   Prior chronic systolic heart failure, 2/2 ischemic cardiomyopathy. EF 40-45% before, now normalized on most recent echo -euvolemic -no beta blocker due to bradycardia -have tried ACEi and ARB,  no room given baseline low blood pressures. -given baseline elevated potassium, no room for spironolactone   CAD s/p NSTEMI, with diffuse severe disease seen on cath -continue aspirin -continue rosuvastatin -resting sinus bradycardia. No room for beta blocker -medical management   History of mechanical AVR, on coumadin -continue coumadin and aspirin -SBE prophylaxis with dental procedures, but has dentures in.  Plan for follow up: 6 mos or sooner as needed   Medication Adjustments/Labs and Tests Ordered: Current medicines are reviewed at length with the patient today.  Concerns regarding medicines are outlined above.  Orders Placed This Encounter  Procedures   EKG 12-Lead    No orders of the defined types were placed in this encounter.   Patient Instructions  Medication Instructions:  Your physician recommends that you continue on your current medications as directed. Please refer to the Current Medication list given to you today.  *If you need a refill on your cardiac medications before your next appointment, please call your pharmacy*   Lab Work: None orderd  If you have labs (blood work) drawn today and your tests are completely normal, you will receive your results only by: [place] (if you have MyChart) OR A paper copy in the mail If you have any lab test that is abnormal or we need to change your treatment, we will call you to review the results.   Testing/Procedures: None ordered   Follow-Up: At [facility], you and your health needs are our priority.  As part of our continuing mission to provide you with exceptional heart care, we have created designated Provider Care Teams.  These Care Teams include your primary Cardiologist (physician) and Advanced Practice Providers (APPs -  Physician Assistants and Nurse Practitioners) who all work together to provide you with the care you need, when you need it.  We recommend signing up for the patient portal  called "MyChart".  Sign up information is provided on this After Visit Summary.  MyChart is used to connect with patients for Virtual Visits (Telemedicine).  Patients are able to view lab/test results, encounter notes, upcoming appointments, etc.  Non-urgent messages can be sent to your provider as well.   To learn more about what you can do with MyChart, go to [email or web address].    Your next appointment:   6 month(s)  The format for your next appointment:   In Person  Provider:   Buford Dresser, MD   Other Instructions  I,Mathew Stumpf,acting as a Education administrator for [employer], MD.,have documented all relevant documentation on the behalf of Buford Dresser, MD,as directed by  Buford Dresser, MD while in the presence of Buford Dresser, MD.  I, Buford Dresser, MD, have reviewed all documentation for this visit. The documentation on 10/16/20 for the exam, diagnosis, procedures, and orders are all accurate and complete.   Signed, Buford Dresser, MD PhD 09/17/2020     [place]

## 2020-09-18 DIAGNOSIS — Z1389 Encounter for screening for other disorder: Secondary | ICD-10-CM | POA: Diagnosis not present

## 2020-09-18 DIAGNOSIS — Z0000 Encounter for general adult medical examination without abnormal findings: Secondary | ICD-10-CM | POA: Diagnosis not present

## 2020-09-18 DIAGNOSIS — I10 Essential (primary) hypertension: Secondary | ICD-10-CM | POA: Diagnosis not present

## 2020-09-18 DIAGNOSIS — I4891 Unspecified atrial fibrillation: Secondary | ICD-10-CM | POA: Diagnosis not present

## 2020-09-18 DIAGNOSIS — F0390 Unspecified dementia without behavioral disturbance: Secondary | ICD-10-CM | POA: Diagnosis not present

## 2020-09-18 DIAGNOSIS — G4733 Obstructive sleep apnea (adult) (pediatric): Secondary | ICD-10-CM | POA: Diagnosis not present

## 2020-09-18 DIAGNOSIS — I509 Heart failure, unspecified: Secondary | ICD-10-CM | POA: Diagnosis not present

## 2020-09-18 DIAGNOSIS — I700 Atherosclerosis of aorta: Secondary | ICD-10-CM | POA: Diagnosis not present

## 2020-09-18 DIAGNOSIS — Z8546 Personal history of malignant neoplasm of prostate: Secondary | ICD-10-CM | POA: Diagnosis not present

## 2020-09-18 DIAGNOSIS — Z952 Presence of prosthetic heart valve: Secondary | ICD-10-CM | POA: Diagnosis not present

## 2020-09-18 DIAGNOSIS — N1831 Chronic kidney disease, stage 3a: Secondary | ICD-10-CM | POA: Diagnosis not present

## 2020-09-18 DIAGNOSIS — I252 Old myocardial infarction: Secondary | ICD-10-CM | POA: Diagnosis not present

## 2020-09-18 DIAGNOSIS — G250 Essential tremor: Secondary | ICD-10-CM | POA: Diagnosis not present

## 2020-09-18 DIAGNOSIS — D696 Thrombocytopenia, unspecified: Secondary | ICD-10-CM | POA: Diagnosis not present

## 2020-09-22 LAB — POCT INR: INR: 2.8 (ref 2.0–3.0)

## 2020-09-23 ENCOUNTER — Ambulatory Visit (INDEPENDENT_AMBULATORY_CARE_PROVIDER_SITE_OTHER): Payer: Medicare Other | Admitting: Internal Medicine

## 2020-09-23 DIAGNOSIS — Z952 Presence of prosthetic heart valve: Secondary | ICD-10-CM | POA: Diagnosis not present

## 2020-09-23 DIAGNOSIS — Z7901 Long term (current) use of anticoagulants: Secondary | ICD-10-CM | POA: Diagnosis not present

## 2020-09-29 ENCOUNTER — Ambulatory Visit (INDEPENDENT_AMBULATORY_CARE_PROVIDER_SITE_OTHER): Payer: Medicare Other | Admitting: Cardiology

## 2020-09-29 DIAGNOSIS — Z5181 Encounter for therapeutic drug level monitoring: Secondary | ICD-10-CM | POA: Diagnosis not present

## 2020-09-29 DIAGNOSIS — Z952 Presence of prosthetic heart valve: Secondary | ICD-10-CM | POA: Diagnosis not present

## 2020-09-29 DIAGNOSIS — Z7901 Long term (current) use of anticoagulants: Secondary | ICD-10-CM

## 2020-09-29 LAB — POCT INR: INR: 2.9 (ref 2.0–3.0)

## 2020-09-30 ENCOUNTER — Ambulatory Visit (HOSPITAL_BASED_OUTPATIENT_CLINIC_OR_DEPARTMENT_OTHER): Payer: Medicare Other | Admitting: Cardiology

## 2020-10-06 ENCOUNTER — Ambulatory Visit (INDEPENDENT_AMBULATORY_CARE_PROVIDER_SITE_OTHER): Payer: Medicare Other | Admitting: Cardiovascular Disease

## 2020-10-06 DIAGNOSIS — Z7901 Long term (current) use of anticoagulants: Secondary | ICD-10-CM

## 2020-10-06 DIAGNOSIS — Z952 Presence of prosthetic heart valve: Secondary | ICD-10-CM

## 2020-10-06 DIAGNOSIS — Z5181 Encounter for therapeutic drug level monitoring: Secondary | ICD-10-CM

## 2020-10-06 LAB — POCT INR: INR: 2.9 (ref 2.0–3.0)

## 2020-10-10 ENCOUNTER — Ambulatory Visit (HOSPITAL_BASED_OUTPATIENT_CLINIC_OR_DEPARTMENT_OTHER): Payer: Medicare Other | Admitting: Nurse Practitioner

## 2020-10-13 ENCOUNTER — Ambulatory Visit (INDEPENDENT_AMBULATORY_CARE_PROVIDER_SITE_OTHER): Payer: Medicare Other | Admitting: Internal Medicine

## 2020-10-13 DIAGNOSIS — I480 Paroxysmal atrial fibrillation: Secondary | ICD-10-CM | POA: Diagnosis not present

## 2020-10-13 DIAGNOSIS — Z952 Presence of prosthetic heart valve: Secondary | ICD-10-CM

## 2020-10-13 DIAGNOSIS — Z7901 Long term (current) use of anticoagulants: Secondary | ICD-10-CM

## 2020-10-13 DIAGNOSIS — Z5181 Encounter for therapeutic drug level monitoring: Secondary | ICD-10-CM

## 2020-10-13 LAB — POCT INR: INR: 2.7 (ref 2.0–3.0)

## 2020-10-16 ENCOUNTER — Encounter (HOSPITAL_BASED_OUTPATIENT_CLINIC_OR_DEPARTMENT_OTHER): Payer: Self-pay | Admitting: Cardiology

## 2020-10-17 ENCOUNTER — Emergency Department (HOSPITAL_BASED_OUTPATIENT_CLINIC_OR_DEPARTMENT_OTHER)
Admission: EM | Admit: 2020-10-17 | Discharge: 2020-10-18 | Disposition: A | Discharge status: Home / Self Care | Payer: Medicare Other | Attending: Emergency Medicine | Admitting: Emergency Medicine

## 2020-10-17 ENCOUNTER — Encounter (HOSPITAL_BASED_OUTPATIENT_CLINIC_OR_DEPARTMENT_OTHER): Payer: Self-pay

## 2020-10-17 ENCOUNTER — Other Ambulatory Visit: Payer: Self-pay

## 2020-10-17 DIAGNOSIS — Z8546 Personal history of malignant neoplasm of prostate: Secondary | ICD-10-CM | POA: Diagnosis not present

## 2020-10-17 DIAGNOSIS — Z20822 Contact with and (suspected) exposure to covid-19: Secondary | ICD-10-CM | POA: Insufficient documentation

## 2020-10-17 DIAGNOSIS — N289 Disorder of kidney and ureter, unspecified: Secondary | ICD-10-CM | POA: Diagnosis not present

## 2020-10-17 DIAGNOSIS — Z7901 Long term (current) use of anticoagulants: Secondary | ICD-10-CM | POA: Diagnosis not present

## 2020-10-17 DIAGNOSIS — I2510 Atherosclerotic heart disease of native coronary artery without angina pectoris: Secondary | ICD-10-CM | POA: Insufficient documentation

## 2020-10-17 DIAGNOSIS — M791 Myalgia, unspecified site: Secondary | ICD-10-CM | POA: Diagnosis not present

## 2020-10-17 DIAGNOSIS — Z87891 Personal history of nicotine dependence: Secondary | ICD-10-CM | POA: Insufficient documentation

## 2020-10-17 DIAGNOSIS — I5022 Chronic systolic (congestive) heart failure: Secondary | ICD-10-CM | POA: Diagnosis not present

## 2020-10-17 DIAGNOSIS — J45909 Unspecified asthma, uncomplicated: Secondary | ICD-10-CM | POA: Insufficient documentation

## 2020-10-17 DIAGNOSIS — Z79899 Other long term (current) drug therapy: Secondary | ICD-10-CM | POA: Insufficient documentation

## 2020-10-17 DIAGNOSIS — I110 Hypertensive heart disease with heart failure: Secondary | ICD-10-CM | POA: Insufficient documentation

## 2020-10-17 DIAGNOSIS — F0390 Unspecified dementia without behavioral disturbance: Secondary | ICD-10-CM | POA: Insufficient documentation

## 2020-10-17 DIAGNOSIS — Z7982 Long term (current) use of aspirin: Secondary | ICD-10-CM | POA: Diagnosis not present

## 2020-10-17 DIAGNOSIS — I517 Cardiomegaly: Secondary | ICD-10-CM | POA: Diagnosis not present

## 2020-10-17 DIAGNOSIS — R059 Cough, unspecified: Secondary | ICD-10-CM

## 2020-10-17 DIAGNOSIS — R0602 Shortness of breath: Secondary | ICD-10-CM | POA: Diagnosis present

## 2020-10-17 DIAGNOSIS — I480 Paroxysmal atrial fibrillation: Secondary | ICD-10-CM | POA: Insufficient documentation

## 2020-10-17 DIAGNOSIS — D696 Thrombocytopenia, unspecified: Secondary | ICD-10-CM | POA: Diagnosis not present

## 2020-10-17 DIAGNOSIS — Z7951 Long term (current) use of inhaled steroids: Secondary | ICD-10-CM | POA: Insufficient documentation

## 2020-10-17 NOTE — ED Triage Notes (Signed)
Patient arrives from home with DIL with c/o SOB and general aches all over. Patient states this started this morning.

## 2020-10-18 ENCOUNTER — Emergency Department (HOSPITAL_BASED_OUTPATIENT_CLINIC_OR_DEPARTMENT_OTHER): Payer: Medicare Other

## 2020-10-18 DIAGNOSIS — N289 Disorder of kidney and ureter, unspecified: Secondary | ICD-10-CM | POA: Diagnosis not present

## 2020-10-18 DIAGNOSIS — D696 Thrombocytopenia, unspecified: Secondary | ICD-10-CM | POA: Diagnosis not present

## 2020-10-18 DIAGNOSIS — R059 Cough, unspecified: Secondary | ICD-10-CM | POA: Diagnosis not present

## 2020-10-18 DIAGNOSIS — I517 Cardiomegaly: Secondary | ICD-10-CM | POA: Diagnosis not present

## 2020-10-18 LAB — RESP PANEL BY RT-PCR (FLU A&B, COVID) ARPGX2
Influenza A by PCR: NEGATIVE
Influenza B by PCR: NEGATIVE
SARS Coronavirus 2 by RT PCR: NEGATIVE

## 2020-10-18 LAB — PROTIME-INR
INR: 2.4 — ABNORMAL HIGH (ref 0.8–1.2)
Prothrombin Time: 26.5 seconds — ABNORMAL HIGH (ref 11.4–15.2)

## 2020-10-18 LAB — CBC WITH DIFFERENTIAL/PLATELET
Abs Immature Granulocytes: 0.01 10*3/uL (ref 0.00–0.07)
Basophils Absolute: 0 10*3/uL (ref 0.0–0.1)
Basophils Relative: 0 %
Eosinophils Absolute: 0 10*3/uL (ref 0.0–0.5)
Eosinophils Relative: 0 %
HCT: 35.4 % — ABNORMAL LOW (ref 39.0–52.0)
Hemoglobin: 11.7 g/dL — ABNORMAL LOW (ref 13.0–17.0)
Immature Granulocytes: 0 %
Lymphocytes Relative: 7 %
Lymphs Abs: 0.5 10*3/uL — ABNORMAL LOW (ref 0.7–4.0)
MCH: 30.6 pg (ref 26.0–34.0)
MCHC: 33.1 g/dL (ref 30.0–36.0)
MCV: 92.7 fL (ref 80.0–100.0)
Monocytes Absolute: 0.4 10*3/uL (ref 0.1–1.0)
Monocytes Relative: 6 %
Neutro Abs: 5.9 10*3/uL (ref 1.7–7.7)
Neutrophils Relative %: 87 %
Platelets: 105 10*3/uL — ABNORMAL LOW (ref 150–400)
RBC: 3.82 MIL/uL — ABNORMAL LOW (ref 4.22–5.81)
RDW: 14.4 % (ref 11.5–15.5)
Smear Review: NORMAL
WBC: 6.8 10*3/uL (ref 4.0–10.5)
nRBC: 0 % (ref 0.0–0.2)

## 2020-10-18 LAB — BASIC METABOLIC PANEL
Anion gap: 5 (ref 5–15)
BUN: 30 mg/dL — ABNORMAL HIGH (ref 8–23)
CO2: 24 mmol/L (ref 22–32)
Calcium: 8.7 mg/dL — ABNORMAL LOW (ref 8.9–10.3)
Chloride: 102 mmol/L (ref 98–111)
Creatinine, Ser: 1.74 mg/dL — ABNORMAL HIGH (ref 0.61–1.24)
GFR, Estimated: 37 mL/min — ABNORMAL LOW (ref >60)
Glucose, Bld: 121 mg/dL — ABNORMAL HIGH (ref 70–99)
Potassium: 4.7 mmol/L (ref 3.5–5.1)
Sodium: 131 mmol/L — ABNORMAL LOW (ref 135–145)

## 2020-10-18 MED ORDER — DOXYCYCLINE HYCLATE 100 MG PO TABS
100.0000 mg | ORAL_TABLET | Freq: Once | ORAL | Status: AC
  Administered 2020-10-18: 100 mg via ORAL
  Filled 2020-10-18: qty 1

## 2020-10-18 MED ORDER — DOXYCYCLINE HYCLATE 100 MG PO CAPS: 100.0000 mg | ORAL_CAPSULE | Freq: Two times a day (BID) | ORAL | 0 refills | Status: DC

## 2020-10-18 NOTE — ED Provider Notes (Signed)
[facility] EMERGENCY DEPARTMENT Provider Note   CSN: [id number] Arrival date & time: 10/17/20  2334     History Chief Complaint  Patient presents with   Shortness of Breath   Generalized Body Aches    Tanner Ferguson is a 87 y.o. male.  The history is provided by the patient and a relative. The history is limited by the condition of the patient (level 5 caveat, dementia).  Shortness of Breath Severity:  Moderate Onset quality:  Gradual Duration:  1 day Timing:  Constant Progression:  Unchanged Chronicity:  New Context: URI   Relieved by:  Nothing Worsened by:  Nothing Ineffective treatments:  None tried Associated symptoms: cough   Associated symptoms: no diaphoresis, no rash, no sore throat, no sputum production, no syncope, no vomiting and no wheezing   Associated symptoms comment:  Temp of 99.9, body aches  Risk factors: no prolonged immobilization   Patient with pancytopenia and CHF presents with cough and body aches x 1 day.      Past Medical History:  Diagnosis Date   Asthma    CAD (coronary artery disease)    s/p NSTEMI 9/19. Cath in [place], [place] 10/19. Severe CAD not amenable to PCI. Not CABG candidate   CHF (congestive heart failure) (HCC)    EF 40-45% by echo due to iCM. EF 36% by Myoview 9/19   Dementia (HCC)    Essential tremor    Hyperlipidemia    Intolerant of statins   Hypertension    PAF (paroxysmal atrial fibrillation) ([place])    Pancytopenia ([place])    Follows with hematology   Prostate cancer [facility])    Seizure [facility])     Patient Active Problem List   Diagnosis Date Noted   Other pancytopenia ([place]) 11/13/2018   Hyponatremia 10/20/2018   Collapse with respiratory arrest on examination ([place]) 10/20/2018   Hyperlipidemia    Essential tremor    Hypertension    Dementia (HCC)    Anemia    Cellulitis of finger of right hand 10/19/2018   History of mechanical aortic valve replacement [id number]   Chronic systolic heart failure ([place])  03/09/2018   Long term (current) use of anticoagulants 03/09/2018   Ischemic cardiomyopathy 03/09/2018   Coronary artery disease    Atrial fibrillation ([place]) 01/25/2018    Past Surgical History:  Procedure Laterality Date   AORTIC VALVE REPLACEMENT     CARDIOVERSION     CATARACT EXTRACTION Bilateral    HERNIA REPAIR         Family History  Problem Relation Age of Onset   Coronary artery disease Mother    Coronary artery disease Father    Lung cancer Child    Drug abuse Child     Social History   Tobacco Use   Smoking status: Never   Smokeless tobacco: Former    Types: Chew    Quit date: 10/25/2017  Vaping Use   Vaping Use: Never used  Substance Use Topics   Alcohol use: Not Currently    Home Medications Prior to Admission medications   Medication Sig Start Date End Date Taking? Authorizing Provider  albuterol (VENTOLIN HFA) 108 (90 Base) MCG/ACT inhaler Inhale 1-2 puffs into the lungs every 4 (four) hours as needed for wheezing or shortness of breath.  08/01/18   [provider]  amiodarone (PACERONE) 200 MG tablet Take 1/2 (one-half) tablet by mouth once daily 10/15/19   Buford Dresser, MD  aspirin EC 81 MG tablet Take 1 tablet (  81 mg total) by mouth daily with breakfast. 10/21/18   Roxan Hockey, MD  COVID-19 mRNA Vac-TriS, Pfizer, (PFIZER-BIONT COVID-19 VAC-TRIS) SUSP injection Inject into the muscle. 06/13/20   Carlyle Basques, MD  ipratropium (ATROVENT) 0.03 % nasal spray Place 2 sprays into both nostrils every 12 (twelve) hours. 07/17/19   Darr, Edison Nasuti, PA-C  predniSONE (DELTASONE) 20 MG tablet Take 2 tablets (40 mg total) by mouth daily. For 5 days Patient not taking: Reported on 09/17/2020 06/02/20   Marshell Garfinkel, MD  primidone (MYSOLINE) 50 MG tablet Take 1 tablet (50 mg total) by mouth in the morning and at bedtime. Patient not taking: Reported on 09/17/2020 10/29/19   Tat, Eustace Quail, DO  rosuvastatin (CRESTOR) 20 MG tablet Take 1 tablet (20 mg  total) by mouth every evening. 10/21/18 09/17/20  Roxan Hockey, MD  sodium chloride (OCEAN) 0.65 % SOLN nasal spray Place 1 spray into both nostrils as needed for congestion. 07/17/19   Darr, Edison Nasuti, PA-C  SYMBICORT 80-4.5 MCG/ACT inhaler Inhale 2 puffs by mouth twice daily 10/22/19   Mannam, Hart Robinsons, MD  tamsulosin (FLOMAX) 0.4 MG CAPS capsule Take 0.4 mg by mouth daily after supper.     [provider]  warfarin (COUMADIN) 2.5 MG tablet Take 0.5-1 tablet daily or as prescribed by [facility] 04/23/20   Buford Dresser, MD    Allergies    Penicillins  Review of Systems   Review of Systems  Unable to perform ROS: Dementia  Constitutional:  Negative for diaphoresis.  HENT:  Negative for sore throat.   Eyes:  Negative for redness.  Respiratory:  Positive for cough and shortness of breath. Negative for sputum production and wheezing.   Cardiovascular:  Negative for leg swelling and syncope.  Gastrointestinal:  Negative for vomiting.  Musculoskeletal:  Negative for neck stiffness.  Skin:  Negative for rash.  Neurological:  Negative for facial asymmetry.  Psychiatric/Behavioral:  Negative for agitation.   All other systems reviewed and are negative.  Physical Exam Updated Vital Signs BP (!) 169/84 (BP Location: Right Arm)   Pulse 86   Temp 99 F (37.2 C) (Oral)   Resp (!) 21   Ht '5\' 2"'$  (1.575 m)   Wt 69.9 kg   SpO2 98%   BMI 28.17 kg/m   Physical Exam Vitals and nursing note reviewed.  Constitutional:      General: He is not in acute distress.    Appearance: Normal appearance.  HENT:     Head: Normocephalic and atraumatic.     Nose: Nose normal.  Eyes:     Conjunctiva/sclera: Conjunctivae normal.     Pupils: Pupils are equal, round, and reactive to light.  Cardiovascular:     Rate and Rhythm: Normal rate and regular rhythm.     Pulses: Normal pulses.     Heart sounds: Normal heart sounds.  Pulmonary:     Effort: Pulmonary effort is normal.      Breath sounds: Normal breath sounds. No wheezing, rhonchi or rales.  Abdominal:     General: Abdomen is flat. Bowel sounds are normal.     Palpations: Abdomen is soft.     Tenderness: There is no abdominal tenderness. There is no guarding.  Musculoskeletal:        General: Normal range of motion.     Cervical back: Normal range of motion and neck supple.  Skin:    General: Skin is warm and dry.     Capillary Refill: Capillary refill takes less than  2 seconds.  Neurological:     General: No focal deficit present.     Mental Status: He is alert.     Deep Tendon Reflexes: Reflexes normal.  Psychiatric:        Mood and Affect: Mood normal.        Behavior: Behavior normal.    ED Results / Procedures / Treatments   Labs (all labs ordered are listed, but only abnormal results are displayed) Results for orders placed or performed during the hospital encounter of 10/17/20  CBC with Differential/Platelet  Result Value Ref Range   WBC 6.8 4.0 - 10.5 K/uL   RBC 3.82 (L) 4.22 - 5.81 MIL/uL   Hemoglobin 11.7 (L) 13.0 - 17.0 g/dL   HCT 35.4 (L) 39.0 - 52.0 %   MCV 92.7 80.0 - 100.0 fL   MCH 30.6 26.0 - 34.0 pg   MCHC 33.1 30.0 - 36.0 g/dL   RDW 14.4 11.5 - 15.5 %   Platelets 105 (L) 150 - 400 K/uL   nRBC 0.0 0.0 - 0.2 %   Neutrophils Relative % 87 %   Neutro Abs 5.9 1.7 - 7.7 K/uL   Lymphocytes Relative 7 %   Lymphs Abs 0.5 (L) 0.7 - 4.0 K/uL   Monocytes Relative 6 %   Monocytes Absolute 0.4 0.1 - 1.0 K/uL   Eosinophils Relative 0 %   Eosinophils Absolute 0.0 0.0 - 0.5 K/uL   Basophils Relative 0 %   Basophils Absolute 0.0 0.0 - 0.1 K/uL   Smear Review Normal platelet morphology    Immature Granulocytes 0 %   Abs Immature Granulocytes 0.01 0.00 - 0.07 K/uL  Basic metabolic panel  Result Value Ref Range   Sodium 131 (L) 135 - 145 mmol/L   Potassium 4.7 3.5 - 5.1 mmol/L   Chloride 102 98 - 111 mmol/L   CO2 24 22 - 32 mmol/L   Glucose, Bld 121 (H) 70 - 99 mg/dL   BUN 30 (H) 8 -  23 mg/dL   Creatinine, Ser 1.74 (H) 0.61 - 1.24 mg/dL   Calcium 8.7 (L) 8.9 - 10.3 mg/dL   GFR, Estimated 37 (L) >60 mL/min   Anion gap 5 5 - 15  Protime-INR  Result Value Ref Range   Prothrombin Time 26.5 (H) 11.4 - 15.2 seconds   INR 2.4 (H) 0.8 - 1.2   DG Chest Portable 1 View  Result Date: 10/18/2020 CLINICAL DATA:  Cough. EXAM: PORTABLE CHEST 1 VIEW COMPARISON:  Chest CT dated 06/13/2020. FINDINGS: Minimal bibasilar atelectasis/scarring. No focal consolidation, pleural effusion, pneumothorax. There is mild cardiomegaly. Median sternotomy wires. No acute osseous pathology. IMPRESSION: No active cardiopulmonary disease. Electronically Signed   By: Anner Crete M.D.   On: 10/18/2020 01:17     Radiology DG Chest Portable 1 View  Result Date: 10/18/2020 CLINICAL DATA:  Cough. EXAM: PORTABLE CHEST 1 VIEW COMPARISON:  Chest CT dated 06/13/2020. FINDINGS: Minimal bibasilar atelectasis/scarring. No focal consolidation, pleural effusion, pneumothorax. There is mild cardiomegaly. Median sternotomy wires. No acute osseous pathology. IMPRESSION: No active cardiopulmonary disease. Electronically Signed   By: Anner Crete M.D.   On: 10/18/2020 01:17    Procedures Procedures   Medications Ordered in ED Medications - No data to display  ED Course  I have reviewed the triage vital signs and the nursing notes.  Pertinent labs & imaging results that were available during my care of the patient were reviewed by me and considered in my medical decision making (see  chart for details).   Well appearing, lungs are clear.  Vitals are normal.  No signs of sepsis.  Follow up with your PMD regarding your renal insufficiency, patient is reportedly known to have renal insufficiency and sees nephrology.  Will start doxycycline and have patient follow up in 2 days with PMD.  Strict return precautions given.     Damontez Bevan was evaluated in Emergency Department on 10/18/2020 for the symptoms described  in the history of present illness. He was evaluated in the context of the global COVID-19 pandemic, which necessitated consideration that the patient might be at risk for infection with the SARS-CoV-2 virus that causes COVID-19. Institutional protocols and algorithms that pertain to the evaluation of patients at risk for COVID-19 are in a state of rapid change based on information released by regulatory bodies including the CDC and federal and state organizations. These policies and algorithms were followed during the patient's care in the ED.  Final Clinical Impression(s) / ED Diagnoses Final diagnoses:  Renal insufficiency  Thrombocytopenia ([place])   Return for intractable cough, coughing up blood, fevers > 100.4 unrelieved by medication, shortness of breath, intractable vomiting, chest pain, shortness of breath, weakness, numbness, changes in speech, facial asymmetry, abdominal pain, passing out, Inability to tolerate liquids or food, cough, altered mental status or any concerns. No signs of systemic illness or infection. The patient is nontoxic-appearing on exam and vital signs are within normal limits. I have reviewed the triage vital signs and the nursing notes. Pertinent labs & imaging results that were available during my care of the patient were reviewed by me and considered in my medical decision making (see chart for details). After history, exam, and medical workup I feel the patient has been appropriately medically screened and is safe for discharge home. Pertinent diagnoses were discussed with the patient. Patient was given return precautions. Rx / DC Orders ED Discharge Orders     None        Jovonta Levit, MD 10/18/20 [id number]

## 2020-10-20 ENCOUNTER — Ambulatory Visit: Payer: Self-pay | Admitting: Cardiology

## 2020-10-20 DIAGNOSIS — I480 Paroxysmal atrial fibrillation: Secondary | ICD-10-CM

## 2020-10-20 DIAGNOSIS — Z952 Presence of prosthetic heart valve: Secondary | ICD-10-CM

## 2020-10-20 DIAGNOSIS — Z7901 Long term (current) use of anticoagulants: Secondary | ICD-10-CM

## 2020-10-20 LAB — POCT INR: INR: 2.3 (ref 2.0–3.0)

## 2020-10-22 DIAGNOSIS — R410 Disorientation, unspecified: Secondary | ICD-10-CM | POA: Diagnosis not present

## 2020-10-22 DIAGNOSIS — J40 Bronchitis, not specified as acute or chronic: Secondary | ICD-10-CM | POA: Diagnosis not present

## 2020-10-22 DIAGNOSIS — J849 Interstitial pulmonary disease, unspecified: Secondary | ICD-10-CM | POA: Diagnosis not present

## 2020-10-27 ENCOUNTER — Ambulatory Visit (INDEPENDENT_AMBULATORY_CARE_PROVIDER_SITE_OTHER): Payer: Medicare Other | Admitting: Cardiology

## 2020-10-27 DIAGNOSIS — Z952 Presence of prosthetic heart valve: Secondary | ICD-10-CM

## 2020-10-27 DIAGNOSIS — Z7901 Long term (current) use of anticoagulants: Secondary | ICD-10-CM

## 2020-10-27 DIAGNOSIS — Z5181 Encounter for therapeutic drug level monitoring: Secondary | ICD-10-CM | POA: Diagnosis not present

## 2020-10-27 LAB — POCT INR: INR: 3.4 — AB (ref 2.0–3.0)

## 2020-10-31 ENCOUNTER — Other Ambulatory Visit (HOSPITAL_BASED_OUTPATIENT_CLINIC_OR_DEPARTMENT_OTHER): Payer: Self-pay

## 2020-11-03 LAB — POCT INR: INR: 3.1 — AB (ref 2.0–3.0)

## 2020-11-04 ENCOUNTER — Ambulatory Visit (INDEPENDENT_AMBULATORY_CARE_PROVIDER_SITE_OTHER): Payer: Medicare Other | Admitting: Cardiovascular Disease

## 2020-11-04 DIAGNOSIS — Z5181 Encounter for therapeutic drug level monitoring: Secondary | ICD-10-CM | POA: Diagnosis not present

## 2020-11-04 DIAGNOSIS — Z952 Presence of prosthetic heart valve: Secondary | ICD-10-CM | POA: Diagnosis not present

## 2020-11-04 DIAGNOSIS — Z7901 Long term (current) use of anticoagulants: Secondary | ICD-10-CM | POA: Diagnosis not present

## 2020-11-09 DIAGNOSIS — Z23 Encounter for immunization: Secondary | ICD-10-CM | POA: Diagnosis not present

## 2020-11-10 ENCOUNTER — Telehealth: Payer: Self-pay

## 2020-11-10 NOTE — Telephone Encounter (Signed)
LMOM FOR OVERDUE INR 

## 2020-11-11 DIAGNOSIS — Z952 Presence of prosthetic heart valve: Secondary | ICD-10-CM | POA: Diagnosis not present

## 2020-11-11 DIAGNOSIS — I480 Paroxysmal atrial fibrillation: Secondary | ICD-10-CM | POA: Diagnosis not present

## 2020-11-11 DIAGNOSIS — Z7901 Long term (current) use of anticoagulants: Secondary | ICD-10-CM | POA: Diagnosis not present

## 2020-11-11 LAB — POCT INR: INR: 3.2 — AB (ref 2.0–3.0)

## 2020-11-11 NOTE — Telephone Encounter (Signed)
LMOM FOR OVERDUE INR 

## 2020-11-12 ENCOUNTER — Ambulatory Visit (INDEPENDENT_AMBULATORY_CARE_PROVIDER_SITE_OTHER): Payer: Medicare Other | Admitting: Cardiovascular Disease

## 2020-11-12 DIAGNOSIS — Z7901 Long term (current) use of anticoagulants: Secondary | ICD-10-CM

## 2020-11-12 DIAGNOSIS — Z952 Presence of prosthetic heart valve: Secondary | ICD-10-CM

## 2020-11-12 DIAGNOSIS — Z5181 Encounter for therapeutic drug level monitoring: Secondary | ICD-10-CM

## 2020-11-17 ENCOUNTER — Ambulatory Visit (INDEPENDENT_AMBULATORY_CARE_PROVIDER_SITE_OTHER): Payer: Medicare Other | Admitting: Internal Medicine

## 2020-11-17 ENCOUNTER — Telehealth: Payer: Self-pay

## 2020-11-17 DIAGNOSIS — Z7901 Long term (current) use of anticoagulants: Secondary | ICD-10-CM

## 2020-11-17 DIAGNOSIS — Z5181 Encounter for therapeutic drug level monitoring: Secondary | ICD-10-CM

## 2020-11-17 DIAGNOSIS — Z952 Presence of prosthetic heart valve: Secondary | ICD-10-CM

## 2020-11-17 LAB — POCT INR: INR: 2.6 (ref 2.0–3.0)

## 2020-11-17 NOTE — Telephone Encounter (Signed)
Mom to complete inr

## 2020-11-24 ENCOUNTER — Ambulatory Visit (INDEPENDENT_AMBULATORY_CARE_PROVIDER_SITE_OTHER): Payer: Medicare Other | Admitting: Cardiovascular Disease

## 2020-11-24 DIAGNOSIS — Z952 Presence of prosthetic heart valve: Secondary | ICD-10-CM

## 2020-11-24 DIAGNOSIS — Z5181 Encounter for therapeutic drug level monitoring: Secondary | ICD-10-CM

## 2020-11-24 DIAGNOSIS — Z7901 Long term (current) use of anticoagulants: Secondary | ICD-10-CM

## 2020-11-24 LAB — POCT INR: INR: 2.5 (ref 2.0–3.0)

## 2020-12-01 LAB — POCT INR: INR: 2.8 (ref 2.0–3.0)

## 2020-12-02 ENCOUNTER — Ambulatory Visit (INDEPENDENT_AMBULATORY_CARE_PROVIDER_SITE_OTHER): Payer: Medicare Other | Admitting: Cardiovascular Disease

## 2020-12-02 DIAGNOSIS — Z5181 Encounter for therapeutic drug level monitoring: Secondary | ICD-10-CM | POA: Diagnosis not present

## 2020-12-02 DIAGNOSIS — Z952 Presence of prosthetic heart valve: Secondary | ICD-10-CM | POA: Diagnosis not present

## 2020-12-02 DIAGNOSIS — Z7901 Long term (current) use of anticoagulants: Secondary | ICD-10-CM

## 2020-12-02 NOTE — Patient Instructions (Signed)
Description    Called and spoke to pt's son and instructed pt to continue with taking warfarin 1 tablet daily except 1/2 tablet each Monday, Wednesday and Friday.  Repeat INR in 1 week

## 2020-12-08 ENCOUNTER — Ambulatory Visit (INDEPENDENT_AMBULATORY_CARE_PROVIDER_SITE_OTHER): Payer: Medicare Other | Admitting: Cardiovascular Disease

## 2020-12-08 DIAGNOSIS — Z952 Presence of prosthetic heart valve: Secondary | ICD-10-CM | POA: Diagnosis not present

## 2020-12-08 DIAGNOSIS — Z5181 Encounter for therapeutic drug level monitoring: Secondary | ICD-10-CM

## 2020-12-08 DIAGNOSIS — Z7901 Long term (current) use of anticoagulants: Secondary | ICD-10-CM

## 2020-12-08 LAB — POCT INR: INR: 2.7 (ref 2.0–3.0)

## 2020-12-15 ENCOUNTER — Ambulatory Visit (INDEPENDENT_AMBULATORY_CARE_PROVIDER_SITE_OTHER): Payer: Medicare Other | Admitting: Cardiology

## 2020-12-15 DIAGNOSIS — Z952 Presence of prosthetic heart valve: Secondary | ICD-10-CM

## 2020-12-15 DIAGNOSIS — Z7901 Long term (current) use of anticoagulants: Secondary | ICD-10-CM

## 2020-12-15 DIAGNOSIS — I480 Paroxysmal atrial fibrillation: Secondary | ICD-10-CM | POA: Diagnosis not present

## 2020-12-15 DIAGNOSIS — Z5181 Encounter for therapeutic drug level monitoring: Secondary | ICD-10-CM | POA: Diagnosis not present

## 2020-12-15 LAB — POCT INR: INR: 2.9 (ref 2.0–3.0)

## 2020-12-22 LAB — POCT INR: INR: 3.2 — AB (ref 2.0–3.0)

## 2020-12-23 ENCOUNTER — Ambulatory Visit (INDEPENDENT_AMBULATORY_CARE_PROVIDER_SITE_OTHER): Payer: Medicare Other | Admitting: Cardiovascular Disease

## 2020-12-23 DIAGNOSIS — I480 Paroxysmal atrial fibrillation: Secondary | ICD-10-CM

## 2020-12-23 DIAGNOSIS — Z7901 Long term (current) use of anticoagulants: Secondary | ICD-10-CM | POA: Diagnosis not present

## 2020-12-23 DIAGNOSIS — Z952 Presence of prosthetic heart valve: Secondary | ICD-10-CM

## 2020-12-23 NOTE — Patient Instructions (Signed)
Description   Spoke with son. Continue taking warfarin 1 tablet daily except 1/2 tablet each Monday, Wednesday and Friday.  Repeat INR in 1 week

## 2020-12-29 ENCOUNTER — Ambulatory Visit (INDEPENDENT_AMBULATORY_CARE_PROVIDER_SITE_OTHER): Payer: Medicare Other | Admitting: Cardiology

## 2020-12-29 DIAGNOSIS — Z952 Presence of prosthetic heart valve: Secondary | ICD-10-CM

## 2020-12-29 DIAGNOSIS — Z5181 Encounter for therapeutic drug level monitoring: Secondary | ICD-10-CM | POA: Diagnosis not present

## 2020-12-29 DIAGNOSIS — Z7901 Long term (current) use of anticoagulants: Secondary | ICD-10-CM

## 2020-12-29 LAB — POCT INR: INR: 3.3 — AB (ref 2.0–3.0)

## 2021-01-05 LAB — POCT INR: INR: 2.8 (ref 2.0–3.0)

## 2021-01-06 ENCOUNTER — Ambulatory Visit (INDEPENDENT_AMBULATORY_CARE_PROVIDER_SITE_OTHER): Payer: Medicare Other | Admitting: Cardiology

## 2021-01-06 DIAGNOSIS — Z7901 Long term (current) use of anticoagulants: Secondary | ICD-10-CM

## 2021-01-06 DIAGNOSIS — I4891 Unspecified atrial fibrillation: Secondary | ICD-10-CM

## 2021-01-06 DIAGNOSIS — Z952 Presence of prosthetic heart valve: Secondary | ICD-10-CM

## 2021-01-06 NOTE — Patient Instructions (Signed)
Description   Spoke with pt's son. Continue taking warfarin 1 tablet daily except 1/2 tablet each Monday, Wednesday and Friday.  Repeat INR in 1 week

## 2021-01-09 ENCOUNTER — Other Ambulatory Visit: Payer: Self-pay | Admitting: Cardiology

## 2021-01-12 ENCOUNTER — Ambulatory Visit (INDEPENDENT_AMBULATORY_CARE_PROVIDER_SITE_OTHER): Payer: Medicare Other | Admitting: Cardiology

## 2021-01-12 DIAGNOSIS — Z5181 Encounter for therapeutic drug level monitoring: Secondary | ICD-10-CM

## 2021-01-12 DIAGNOSIS — Z7901 Long term (current) use of anticoagulants: Secondary | ICD-10-CM | POA: Diagnosis not present

## 2021-01-12 DIAGNOSIS — I480 Paroxysmal atrial fibrillation: Secondary | ICD-10-CM | POA: Diagnosis not present

## 2021-01-12 DIAGNOSIS — Z952 Presence of prosthetic heart valve: Secondary | ICD-10-CM | POA: Diagnosis not present

## 2021-01-12 LAB — POCT INR: INR: 3 (ref 2.0–3.0)

## 2021-01-12 NOTE — Patient Instructions (Signed)
Description   Spoke with pt's son. Continue taking warfarin 1 tablet daily except 1/2 tablet each Monday, Wednesday and Friday.  Repeat INR in 1 week

## 2021-01-19 ENCOUNTER — Ambulatory Visit (INDEPENDENT_AMBULATORY_CARE_PROVIDER_SITE_OTHER): Payer: Medicare Other | Admitting: Cardiology

## 2021-01-19 DIAGNOSIS — Z7901 Long term (current) use of anticoagulants: Secondary | ICD-10-CM

## 2021-01-19 DIAGNOSIS — Z5181 Encounter for therapeutic drug level monitoring: Secondary | ICD-10-CM | POA: Diagnosis not present

## 2021-01-19 DIAGNOSIS — Z952 Presence of prosthetic heart valve: Secondary | ICD-10-CM | POA: Diagnosis not present

## 2021-01-19 LAB — POCT INR: INR: 3.3 — AB (ref 2.0–3.0)

## 2021-01-26 ENCOUNTER — Ambulatory Visit (INDEPENDENT_AMBULATORY_CARE_PROVIDER_SITE_OTHER): Payer: Medicare Other | Admitting: Internal Medicine

## 2021-01-26 DIAGNOSIS — Z952 Presence of prosthetic heart valve: Secondary | ICD-10-CM | POA: Diagnosis not present

## 2021-01-26 DIAGNOSIS — Z7901 Long term (current) use of anticoagulants: Secondary | ICD-10-CM

## 2021-01-26 DIAGNOSIS — Z5181 Encounter for therapeutic drug level monitoring: Secondary | ICD-10-CM

## 2021-01-26 LAB — POCT INR: INR: 3.6 — AB (ref 2.0–3.0)

## 2021-02-02 LAB — POCT INR: INR: 3.3 — AB (ref 2.0–3.0)

## 2021-02-03 ENCOUNTER — Telehealth: Payer: Self-pay | Admitting: *Deleted

## 2021-02-03 ENCOUNTER — Ambulatory Visit (INDEPENDENT_AMBULATORY_CARE_PROVIDER_SITE_OTHER): Payer: Medicare Other | Admitting: Internal Medicine

## 2021-02-03 DIAGNOSIS — Z5181 Encounter for therapeutic drug level monitoring: Secondary | ICD-10-CM | POA: Diagnosis not present

## 2021-02-03 NOTE — Telephone Encounter (Signed)
Pt overdue to have INR checked. Pt self checks INR. Called pt and LMOM to call clinic back.

## 2021-02-03 NOTE — Patient Instructions (Signed)
Description   Spoke to son and instructed son to have pt continue to take warfarin 1 tablet daily except for 1/2 a tablet on Monday, Wednesday and Friday. Recheck INR in 1 week.

## 2021-02-09 ENCOUNTER — Ambulatory Visit (INDEPENDENT_AMBULATORY_CARE_PROVIDER_SITE_OTHER): Payer: Medicare Other

## 2021-02-09 DIAGNOSIS — Z7901 Long term (current) use of anticoagulants: Secondary | ICD-10-CM

## 2021-02-09 DIAGNOSIS — I480 Paroxysmal atrial fibrillation: Secondary | ICD-10-CM

## 2021-02-09 DIAGNOSIS — Z952 Presence of prosthetic heart valve: Secondary | ICD-10-CM | POA: Diagnosis not present

## 2021-02-09 LAB — POCT INR: INR: 3.2 — AB (ref 2.0–3.0)

## 2021-02-09 NOTE — Patient Instructions (Signed)
Spoke to son and instructed son to have pt continue to take warfarin 1 tablet daily except for 1/2 a tablet on Monday, Wednesday and Friday. Recheck INR in 1 week.

## 2021-02-16 ENCOUNTER — Ambulatory Visit (INDEPENDENT_AMBULATORY_CARE_PROVIDER_SITE_OTHER): Payer: Medicare Other | Admitting: Cardiology

## 2021-02-16 DIAGNOSIS — Z952 Presence of prosthetic heart valve: Secondary | ICD-10-CM

## 2021-02-16 DIAGNOSIS — I480 Paroxysmal atrial fibrillation: Secondary | ICD-10-CM | POA: Diagnosis not present

## 2021-02-16 DIAGNOSIS — Z5181 Encounter for therapeutic drug level monitoring: Secondary | ICD-10-CM | POA: Diagnosis not present

## 2021-02-16 DIAGNOSIS — I4891 Unspecified atrial fibrillation: Secondary | ICD-10-CM

## 2021-02-16 DIAGNOSIS — Z7901 Long term (current) use of anticoagulants: Secondary | ICD-10-CM

## 2021-02-16 LAB — POCT INR: INR: 2.8 (ref 2.0–3.0)

## 2021-02-23 LAB — POCT INR: INR: 2.8 (ref 2.0–3.0)

## 2021-02-24 ENCOUNTER — Ambulatory Visit (INDEPENDENT_AMBULATORY_CARE_PROVIDER_SITE_OTHER): Payer: Medicare Other | Admitting: Pharmacist

## 2021-02-24 DIAGNOSIS — I480 Paroxysmal atrial fibrillation: Secondary | ICD-10-CM

## 2021-02-24 DIAGNOSIS — Z7901 Long term (current) use of anticoagulants: Secondary | ICD-10-CM

## 2021-02-24 DIAGNOSIS — Z952 Presence of prosthetic heart valve: Secondary | ICD-10-CM

## 2021-02-24 NOTE — Patient Instructions (Addendum)
Description   Spoke to son and instructed son to have pt continue to take warfarin 1 tablet daily except for 1/2 a tablet on Monday, Wednesday and Friday. Recheck INR in 1 week.

## 2021-03-03 LAB — POCT INR: INR: 3 (ref 2.0–3.0)

## 2021-03-04 ENCOUNTER — Ambulatory Visit (INDEPENDENT_AMBULATORY_CARE_PROVIDER_SITE_OTHER): Payer: Medicare Other | Admitting: Internal Medicine

## 2021-03-04 DIAGNOSIS — Z952 Presence of prosthetic heart valve: Secondary | ICD-10-CM | POA: Diagnosis not present

## 2021-03-04 DIAGNOSIS — Z5181 Encounter for therapeutic drug level monitoring: Secondary | ICD-10-CM | POA: Diagnosis not present

## 2021-03-04 DIAGNOSIS — Z7901 Long term (current) use of anticoagulants: Secondary | ICD-10-CM

## 2021-03-09 ENCOUNTER — Ambulatory Visit (INDEPENDENT_AMBULATORY_CARE_PROVIDER_SITE_OTHER): Payer: Medicare Other | Admitting: Cardiovascular Disease

## 2021-03-09 DIAGNOSIS — Z5181 Encounter for therapeutic drug level monitoring: Secondary | ICD-10-CM

## 2021-03-09 DIAGNOSIS — Z952 Presence of prosthetic heart valve: Secondary | ICD-10-CM | POA: Diagnosis not present

## 2021-03-09 DIAGNOSIS — Z7901 Long term (current) use of anticoagulants: Secondary | ICD-10-CM

## 2021-03-09 LAB — POCT INR: INR: 2.5 (ref 2.0–3.0)

## 2021-03-16 ENCOUNTER — Ambulatory Visit (INDEPENDENT_AMBULATORY_CARE_PROVIDER_SITE_OTHER): Payer: Medicare Other | Admitting: Cardiology

## 2021-03-16 DIAGNOSIS — Z7901 Long term (current) use of anticoagulants: Secondary | ICD-10-CM

## 2021-03-16 DIAGNOSIS — Z952 Presence of prosthetic heart valve: Secondary | ICD-10-CM | POA: Diagnosis not present

## 2021-03-16 DIAGNOSIS — Z5181 Encounter for therapeutic drug level monitoring: Secondary | ICD-10-CM | POA: Diagnosis not present

## 2021-03-16 DIAGNOSIS — I480 Paroxysmal atrial fibrillation: Secondary | ICD-10-CM | POA: Diagnosis not present

## 2021-03-16 LAB — POCT INR: INR: 2.5 (ref 2.0–3.0)

## 2021-03-20 ENCOUNTER — Ambulatory Visit (HOSPITAL_BASED_OUTPATIENT_CLINIC_OR_DEPARTMENT_OTHER): Payer: Medicare Other | Admitting: Cardiology

## 2021-03-23 ENCOUNTER — Encounter (HOSPITAL_BASED_OUTPATIENT_CLINIC_OR_DEPARTMENT_OTHER): Payer: Self-pay | Admitting: Cardiology

## 2021-03-23 ENCOUNTER — Ambulatory Visit (INDEPENDENT_AMBULATORY_CARE_PROVIDER_SITE_OTHER): Payer: Medicare Other | Admitting: Cardiology

## 2021-03-23 ENCOUNTER — Other Ambulatory Visit: Payer: Self-pay

## 2021-03-23 VITALS — BP 144/60 | HR 50 | Ht 62.0 in | Wt 157.0 lb

## 2021-03-23 DIAGNOSIS — I255 Ischemic cardiomyopathy: Secondary | ICD-10-CM

## 2021-03-23 DIAGNOSIS — I2584 Coronary atherosclerosis due to calcified coronary lesion: Secondary | ICD-10-CM

## 2021-03-23 DIAGNOSIS — I480 Paroxysmal atrial fibrillation: Secondary | ICD-10-CM

## 2021-03-23 DIAGNOSIS — Z952 Presence of prosthetic heart valve: Secondary | ICD-10-CM | POA: Diagnosis not present

## 2021-03-23 DIAGNOSIS — R0609 Other forms of dyspnea: Secondary | ICD-10-CM

## 2021-03-23 DIAGNOSIS — Z7901 Long term (current) use of anticoagulants: Secondary | ICD-10-CM | POA: Diagnosis not present

## 2021-03-23 DIAGNOSIS — I2510 Atherosclerotic heart disease of native coronary artery without angina pectoris: Secondary | ICD-10-CM | POA: Diagnosis not present

## 2021-03-23 LAB — POCT INR: INR: 2.5 (ref 2.0–3.0)

## 2021-03-23 NOTE — Patient Instructions (Signed)

## 2021-03-23 NOTE — Progress Notes (Signed)
Cardiology Office Note   Date:  03/23/2021   ID:  Tanner Ferguson, DOB Apr 04, 1927, MRN [id number]  PCP:  Wenda Low, MD  Cardiologist:  Buford Dresser, MD PhD  Referring MD: Wenda Low, MD   CC: follow up  History of Present Illness:    Tanner Ferguson is a 88 y.o. male with a hx of aortic stenosis s/p mechanical AVR (1995), CAD with MI 11/2017, ischemic cardiomyopathy, paroxysmal atrial fibrillation, obstructive sleep apnea, hypertension, hyperlipidemia  who is seen for follow up today. Please see my initial note from 03/09/18 for full summary of his cardiac history.  Additional pertinent information:  -seen by Dr. Vaughan Browner 09/28/18. Plan is for continued monitoring of the asbestosis given mild fibrosis, pleural plaques, and minimal restriction/diffusion defects on PFTs. Does not think imaging is typical for amiodarone toxicity, ok to continue. Possibly has mild asthma, planned for allergy referral. Started on AutoPAP for sleep apnea.  Today: Here with his son today. Breathing is main issue, symbicort is the most helpful treatment. Has been less active in the winter, tries to walk when he can. Does climb stairs 3x day at least, rides his exercise bike.   Checks INR at home and calls into coumadin clinic, has been a good experience for them.  Denies chest pain. No PND, orthopnea, LE edema or unexpected weight gain. No syncope or palpitations.    Past Medical History:  Diagnosis Date   Asthma    CAD (coronary artery disease)    s/p NSTEMI 9/19. Cath in [place], [place] 10/19. Severe CAD not amenable to PCI. Not CABG candidate   CHF (congestive heart failure) (HCC)    EF 40-45% by echo due to iCM. EF 36% by Myoview 9/19   Dementia (HCC)    Essential tremor    Hyperlipidemia    Intolerant of statins   Hypertension    PAF (paroxysmal atrial fibrillation) ([place])    Pancytopenia ([place])    Follows with hematology   Prostate cancer ([place])    Seizure ([place])     Current  Medications: Current Outpatient Medications on File Prior to Visit  Medication Sig   albuterol (VENTOLIN HFA) 108 (90 Base) MCG/ACT inhaler Inhale 1-2 puffs into the lungs every 4 (four) hours as needed for wheezing or shortness of breath.    amiodarone (PACERONE) 200 MG tablet Take 1/2 (one-half) tablet by mouth once daily   aspirin EC 81 MG tablet Take 1 tablet (81 mg total) by mouth daily with breakfast.   COVID-19 mRNA Vac-TriS, Pfizer, (PFIZER-BIONT COVID-19 VAC-TRIS) SUSP injection Inject into the muscle.   ipratropium (ATROVENT) 0.03 % nasal spray Place 2 sprays into both nostrils every 12 (twelve) hours.   rosuvastatin (CRESTOR) 20 MG tablet Take 1 tablet (20 mg total) by mouth every evening.   sodium chloride (OCEAN) 0.65 % SOLN nasal spray Place 1 spray into both nostrils as needed for congestion.   SYMBICORT 80-4.5 MCG/ACT inhaler Inhale 2 puffs by mouth twice daily   tamsulosin (FLOMAX) 0.4 MG CAPS capsule Take 0.4 mg by mouth daily after supper.    warfarin (COUMADIN) 2.5 MG tablet Take 0.5-1 tablet daily or as prescribed by Coumadin Clinic   primidone (MYSOLINE) 50 MG tablet Take 1 tablet (50 mg total) by mouth in the morning and at bedtime. (Patient not taking: Reported on 09/17/2020)   No current facility-administered medications on file prior to visit.     Allergies:   Penicillins   Social History   Tobacco Use  Smoking status: Never   Smokeless tobacco: Former    Types: Chew    Quit date: 10/25/2017  Vaping Use   Vaping Use: Never used  Substance Use Topics   Alcohol use: Not Currently    Family History: No FH of premature CAD  ROS:   Please see the history of present illness.   Additional pertinent ROS otherwise unremarkable.  EKGs/Labs/Other Studies Reviewed:    The following studies were reviewed today: Recent pulmonary notes/studies  Echo 11/08/2018:  1. The left ventricle has normal systolic function with an ejection  fraction of 60-65%. The cavity  size was normal. There is mildly increased  left ventricular wall thickness with severe basal septal hypertrophy.  There is apical and apical anterior  akinesis.   2. The right ventricle has normal systolic function. The cavity was  mildly enlarged. There is no increase in right ventricular wall thickness.   3. A Mechanical AVR valve is present in the aortic position. Procedure  Date: 1995 Normal aortic valve prosthesis. The prosthesis is not well  visualized. The mean AVG is 69mmHg. There is trivial perivalvular AI.   4. There is mild to moderate mitral annular calcification present and  mild MR.   5. There is mild dilatation of the ascending aorta measuring 40 mm.   6. Right atrial size was mildly dilated.   7. Left atrial size was moderately dilated.   EKG:  EKG is personally reviewed.   03/23/21: sinus bradycardia with first degree AV block, 50 bpm 09/17/2020: Sinus bradycardia with 1st degree AV block, 52 bpm 09/20/19: sinus bradycardia at 51 bpm  Recent Labs: 10/18/2020: BUN 30; Creatinine, Ser 1.74; Hemoglobin 11.7; Platelets 105; Potassium 4.7; Sodium 131  Recent Lipid Panel No results found for: CHOL, TRIG, HDL, CHOLHDL, VLDL, LDLCALC, LDLDIRECT  Physical Exam:    VS:  BP (!) 144/60 (BP Location: Left Arm, Patient Position: Sitting, Cuff Size: Normal)    Pulse (!) 50    Ht 5\' 2"  (1.575 m)    Wt 157 lb (71.2 kg)    BMI 28.72 kg/m     Wt Readings from Last 3 Encounters:  03/23/21 157 lb (71.2 kg)  10/17/20 154 lb (69.9 kg)  09/17/20 155 lb (70.3 kg)    GEN: Well nourished, well developed in no acute distress HEENT: Normal, moist mucous membranes NECK: No JVD CARDIAC: regular rhythm, normal S1 and crisp mechanical S2, no rubs or gallops. 1/6 systolic murmur. VASCULAR: Radial and DP pulses 2+ bilaterally. No carotid bruits RESPIRATORY:  Upper airways clear with bilateral velcro sounds at base.  ABDOMEN: Soft, non-tender, non-distended MUSCULOSKELETAL:  Ambulates  independently SKIN: Warm and dry, no edema NEUROLOGIC:  Alert and oriented x 3. No focal neuro deficits noted. Has bilateral tremor PSYCHIATRIC:  Normal affect    ASSESSMENT:    1. History of mechanical aortic valve replacement   2. Long term (current) use of anticoagulants   3. Paroxysmal atrial fibrillation (HCC)   4. Ischemic cardiomyopathy   5. Coronary artery disease due to calcified coronary lesion     PLAN:    Shortness of breath, especially with exertion -echo remarkably normal. Small area of scar noted, but normal global EF, normal filling pressures (normal IVC, normal RVSP, E/e' <15), normal mechanical aortic valve gradient. Suggests not a cardiac etiology -appreciate pulmonology evaluation.  -encouraged exercise as tolerated   Paroxysmal atrial fibrillation -very symptomatic when in afib RVR, required 2 cardioversions 2019 -CHA2DS2/VAS Stroke Risk Points=4, but irrelevant as he  needs to be on coumadin for his mechanical valve. -continue amiodarone. See prior extensive discussions regarding this, especially given his pulmonary disease. -followed by [facility] clinic at [facility]   Prior chronic systolic heart failure, 2/2 ischemic cardiomyopathy. EF 40-45% before, now normalized on most recent echo -euvolemic -no beta blocker due to bradycardia -have tried ACEi and ARB, no room given baseline low blood pressures. -given baseline elevated potassium, no room for spironolactone   CAD s/p NSTEMI, with diffuse severe disease seen on cath -continue aspirin -continue rosuvastatin -resting sinus bradycardia. No room for beta blocker -medical management   History of mechanical AVR, on coumadin -continue coumadin and aspirin -SBE prophylaxis with dental procedures, but has dentures in.  Plan for follow up: 6 mos or sooner as needed  Medication Adjustments/Labs and Tests Ordered: Current medicines are reviewed at length with the patient today.  Concerns regarding medicines  are outlined above.  Orders Placed This Encounter  Procedures   EKG 12-Lead   No orders of the defined types were placed in this encounter.  Patient Instructions  Medication Instructions:  Your Physician recommend you continue on your current medication as directed.  ' *If you need a refill on your cardiac medications before your next appointment, please call your pharmacy*   Lab Work: None ordered   Testing/Procedures: None ordered   Follow-Up: At [facility], you and your health needs are our priority.  As part of our continuing mission to provide you with exceptional heart care, we have created designated Provider Care Teams.  These Care Teams include your primary Cardiologist (physician) and Advanced Practice Providers (APPs -  Physician Assistants and Nurse Practitioners) who all work together to provide you with the care you need, when you need it.  We recommend signing up for the patient portal called "MyChart".  Sign up information is provided on this After Visit Summary.  MyChart is used to connect with patients for Virtual Visits (Telemedicine).  Patients are able to view lab/test results, encounter notes, upcoming appointments, etc.  Non-urgent messages can be sent to your provider as well.   To learn more about what you can do with MyChart, go to [email or web address].    Your next appointment:   6 month(s)  The format for your next appointment:   In Person  Provider:   Buford Dresser, MD      Signed, Buford Dresser, MD PhD 03/23/2021     [place]

## 2021-03-24 ENCOUNTER — Ambulatory Visit (INDEPENDENT_AMBULATORY_CARE_PROVIDER_SITE_OTHER): Payer: Medicare Other | Admitting: Cardiology

## 2021-03-24 DIAGNOSIS — Z5181 Encounter for therapeutic drug level monitoring: Secondary | ICD-10-CM

## 2021-03-24 NOTE — Patient Instructions (Signed)
Description   Called and spoke to pt's son and instructed for pt to continue to take warfarin 1 tablet daily except for 1/2 a tablet on Monday, Wednesday and Friday. Recheck INR in 1 week.

## 2021-03-30 ENCOUNTER — Ambulatory Visit (INDEPENDENT_AMBULATORY_CARE_PROVIDER_SITE_OTHER): Payer: Medicare Other | Admitting: Cardiology

## 2021-03-30 DIAGNOSIS — Z952 Presence of prosthetic heart valve: Secondary | ICD-10-CM | POA: Diagnosis not present

## 2021-03-30 DIAGNOSIS — Z7901 Long term (current) use of anticoagulants: Secondary | ICD-10-CM

## 2021-03-30 DIAGNOSIS — Z5181 Encounter for therapeutic drug level monitoring: Secondary | ICD-10-CM

## 2021-03-30 LAB — POCT INR: INR: 2.5 (ref 2.0–3.0)

## 2021-04-06 ENCOUNTER — Ambulatory Visit (INDEPENDENT_AMBULATORY_CARE_PROVIDER_SITE_OTHER): Payer: Medicare Other | Admitting: Cardiology

## 2021-04-06 DIAGNOSIS — Z5181 Encounter for therapeutic drug level monitoring: Secondary | ICD-10-CM

## 2021-04-06 DIAGNOSIS — Z952 Presence of prosthetic heart valve: Secondary | ICD-10-CM

## 2021-04-06 DIAGNOSIS — Z7901 Long term (current) use of anticoagulants: Secondary | ICD-10-CM

## 2021-04-06 LAB — POCT INR: INR: 2.3 (ref 2.0–3.0)

## 2021-04-10 ENCOUNTER — Other Ambulatory Visit: Payer: Self-pay | Admitting: Internal Medicine

## 2021-04-10 ENCOUNTER — Ambulatory Visit
Admission: RE | Admit: 2021-04-10 | Discharge: 2021-04-10 | Disposition: A | Discharge status: Home / Self Care | Payer: Medicare Other | Source: Ambulatory Visit | Attending: Internal Medicine | Admitting: Internal Medicine

## 2021-04-10 DIAGNOSIS — R059 Cough, unspecified: Secondary | ICD-10-CM | POA: Diagnosis not present

## 2021-04-10 DIAGNOSIS — J988 Other specified respiratory disorders: Secondary | ICD-10-CM

## 2021-04-10 DIAGNOSIS — R509 Fever, unspecified: Secondary | ICD-10-CM | POA: Diagnosis not present

## 2021-04-10 DIAGNOSIS — R0602 Shortness of breath: Secondary | ICD-10-CM | POA: Diagnosis not present

## 2021-04-10 DIAGNOSIS — Z125 Encounter for screening for malignant neoplasm of prostate: Secondary | ICD-10-CM | POA: Diagnosis not present

## 2021-04-10 DIAGNOSIS — I700 Atherosclerosis of aorta: Secondary | ICD-10-CM | POA: Diagnosis not present

## 2021-04-10 DIAGNOSIS — J189 Pneumonia, unspecified organism: Secondary | ICD-10-CM | POA: Diagnosis not present

## 2021-04-10 DIAGNOSIS — I10 Essential (primary) hypertension: Secondary | ICD-10-CM | POA: Diagnosis not present

## 2021-04-10 DIAGNOSIS — Z03818 Encounter for observation for suspected exposure to other biological agents ruled out: Secondary | ICD-10-CM | POA: Diagnosis not present

## 2021-04-13 ENCOUNTER — Ambulatory Visit (INDEPENDENT_AMBULATORY_CARE_PROVIDER_SITE_OTHER): Payer: Medicare Other | Admitting: Internal Medicine

## 2021-04-13 DIAGNOSIS — Z5181 Encounter for therapeutic drug level monitoring: Secondary | ICD-10-CM

## 2021-04-13 DIAGNOSIS — Z952 Presence of prosthetic heart valve: Secondary | ICD-10-CM

## 2021-04-13 DIAGNOSIS — Z8546 Personal history of malignant neoplasm of prostate: Secondary | ICD-10-CM | POA: Diagnosis not present

## 2021-04-13 DIAGNOSIS — I10 Essential (primary) hypertension: Secondary | ICD-10-CM | POA: Diagnosis not present

## 2021-04-13 DIAGNOSIS — D6869 Other thrombophilia: Secondary | ICD-10-CM | POA: Diagnosis not present

## 2021-04-13 DIAGNOSIS — I4891 Unspecified atrial fibrillation: Secondary | ICD-10-CM | POA: Diagnosis not present

## 2021-04-13 DIAGNOSIS — N1831 Chronic kidney disease, stage 3a: Secondary | ICD-10-CM | POA: Diagnosis not present

## 2021-04-13 DIAGNOSIS — J189 Pneumonia, unspecified organism: Secondary | ICD-10-CM | POA: Diagnosis not present

## 2021-04-13 DIAGNOSIS — Z7901 Long term (current) use of anticoagulants: Secondary | ICD-10-CM | POA: Diagnosis not present

## 2021-04-13 DIAGNOSIS — R269 Unspecified abnormalities of gait and mobility: Secondary | ICD-10-CM | POA: Diagnosis not present

## 2021-04-13 DIAGNOSIS — I700 Atherosclerosis of aorta: Secondary | ICD-10-CM | POA: Diagnosis not present

## 2021-04-13 DIAGNOSIS — F0390 Unspecified dementia without behavioral disturbance: Secondary | ICD-10-CM | POA: Diagnosis not present

## 2021-04-13 DIAGNOSIS — I480 Paroxysmal atrial fibrillation: Secondary | ICD-10-CM | POA: Diagnosis not present

## 2021-04-13 DIAGNOSIS — I509 Heart failure, unspecified: Secondary | ICD-10-CM | POA: Diagnosis not present

## 2021-04-13 LAB — POCT INR: INR: 2.4 (ref 2.0–3.0)

## 2021-04-20 ENCOUNTER — Ambulatory Visit (INDEPENDENT_AMBULATORY_CARE_PROVIDER_SITE_OTHER): Payer: Medicare Other | Admitting: Cardiology

## 2021-04-20 DIAGNOSIS — I4891 Unspecified atrial fibrillation: Secondary | ICD-10-CM | POA: Diagnosis not present

## 2021-04-20 DIAGNOSIS — Z5181 Encounter for therapeutic drug level monitoring: Secondary | ICD-10-CM

## 2021-04-20 DIAGNOSIS — Z952 Presence of prosthetic heart valve: Secondary | ICD-10-CM | POA: Diagnosis not present

## 2021-04-20 DIAGNOSIS — Z7901 Long term (current) use of anticoagulants: Secondary | ICD-10-CM

## 2021-04-20 LAB — POCT INR: INR: 2.9 (ref 2.0–3.0)

## 2021-04-27 ENCOUNTER — Ambulatory Visit
Admission: RE | Admit: 2021-04-27 | Discharge: 2021-04-27 | Disposition: A | Discharge status: Home / Self Care | Payer: Medicare Other | Source: Ambulatory Visit | Attending: Internal Medicine | Admitting: Internal Medicine

## 2021-04-27 ENCOUNTER — Other Ambulatory Visit: Payer: Self-pay | Admitting: Internal Medicine

## 2021-04-27 ENCOUNTER — Ambulatory Visit (INDEPENDENT_AMBULATORY_CARE_PROVIDER_SITE_OTHER): Payer: Medicare Other | Admitting: Cardiology

## 2021-04-27 DIAGNOSIS — Z8701 Personal history of pneumonia (recurrent): Secondary | ICD-10-CM | POA: Diagnosis not present

## 2021-04-27 DIAGNOSIS — Z7901 Long term (current) use of anticoagulants: Secondary | ICD-10-CM

## 2021-04-27 DIAGNOSIS — J189 Pneumonia, unspecified organism: Secondary | ICD-10-CM

## 2021-04-27 DIAGNOSIS — Z5181 Encounter for therapeutic drug level monitoring: Secondary | ICD-10-CM

## 2021-04-27 DIAGNOSIS — Z952 Presence of prosthetic heart valve: Secondary | ICD-10-CM

## 2021-04-27 LAB — POCT INR: INR: 2.3 (ref 2.0–3.0)

## 2021-05-04 LAB — POCT INR: INR: 2.8 (ref 2.0–3.0)

## 2021-05-05 ENCOUNTER — Telehealth: Payer: Self-pay

## 2021-05-05 NOTE — Telephone Encounter (Signed)
Lmom for overdue inr 

## 2021-05-06 ENCOUNTER — Ambulatory Visit (INDEPENDENT_AMBULATORY_CARE_PROVIDER_SITE_OTHER): Payer: Medicare Other | Admitting: Cardiovascular Disease

## 2021-05-06 DIAGNOSIS — Z5181 Encounter for therapeutic drug level monitoring: Secondary | ICD-10-CM

## 2021-05-06 DIAGNOSIS — Z7901 Long term (current) use of anticoagulants: Secondary | ICD-10-CM

## 2021-05-06 DIAGNOSIS — Z952 Presence of prosthetic heart valve: Secondary | ICD-10-CM | POA: Diagnosis not present

## 2021-05-11 ENCOUNTER — Ambulatory Visit (INDEPENDENT_AMBULATORY_CARE_PROVIDER_SITE_OTHER): Payer: Medicare Other | Admitting: Cardiology

## 2021-05-11 DIAGNOSIS — Z7901 Long term (current) use of anticoagulants: Secondary | ICD-10-CM

## 2021-05-11 DIAGNOSIS — Z952 Presence of prosthetic heart valve: Secondary | ICD-10-CM | POA: Diagnosis not present

## 2021-05-11 DIAGNOSIS — I480 Paroxysmal atrial fibrillation: Secondary | ICD-10-CM | POA: Diagnosis not present

## 2021-05-11 DIAGNOSIS — Z5181 Encounter for therapeutic drug level monitoring: Secondary | ICD-10-CM | POA: Diagnosis not present

## 2021-05-11 LAB — POCT INR: INR: 3 (ref 2.0–3.0)

## 2021-05-18 LAB — POCT INR: INR: 3.1 — AB (ref 2.0–3.0)

## 2021-05-19 ENCOUNTER — Ambulatory Visit (INDEPENDENT_AMBULATORY_CARE_PROVIDER_SITE_OTHER): Payer: Medicare Other | Admitting: Pharmacist Clinician (PhC)/ Clinical Pharmacy Specialist

## 2021-05-19 DIAGNOSIS — I480 Paroxysmal atrial fibrillation: Secondary | ICD-10-CM | POA: Diagnosis not present

## 2021-05-19 DIAGNOSIS — Z952 Presence of prosthetic heart valve: Secondary | ICD-10-CM

## 2021-05-19 DIAGNOSIS — Z7901 Long term (current) use of anticoagulants: Secondary | ICD-10-CM

## 2021-05-25 ENCOUNTER — Ambulatory Visit (INDEPENDENT_AMBULATORY_CARE_PROVIDER_SITE_OTHER): Payer: Medicare Other | Admitting: Cardiovascular Disease

## 2021-05-25 DIAGNOSIS — Z5181 Encounter for therapeutic drug level monitoring: Secondary | ICD-10-CM

## 2021-05-25 DIAGNOSIS — Z952 Presence of prosthetic heart valve: Secondary | ICD-10-CM | POA: Diagnosis not present

## 2021-05-25 DIAGNOSIS — Z7901 Long term (current) use of anticoagulants: Secondary | ICD-10-CM

## 2021-05-25 LAB — POCT INR: INR: 2.9 (ref 2.0–3.0)

## 2021-06-01 ENCOUNTER — Ambulatory Visit (INDEPENDENT_AMBULATORY_CARE_PROVIDER_SITE_OTHER): Payer: Medicare Other | Admitting: Internal Medicine

## 2021-06-01 DIAGNOSIS — Z952 Presence of prosthetic heart valve: Secondary | ICD-10-CM | POA: Diagnosis not present

## 2021-06-01 DIAGNOSIS — Z5181 Encounter for therapeutic drug level monitoring: Secondary | ICD-10-CM

## 2021-06-01 DIAGNOSIS — Z7901 Long term (current) use of anticoagulants: Secondary | ICD-10-CM

## 2021-06-01 LAB — POCT INR: INR: 2.9 (ref 2.0–3.0)

## 2021-06-08 ENCOUNTER — Ambulatory Visit (INDEPENDENT_AMBULATORY_CARE_PROVIDER_SITE_OTHER): Payer: Medicare Other | Admitting: Cardiology

## 2021-06-08 DIAGNOSIS — Z7901 Long term (current) use of anticoagulants: Secondary | ICD-10-CM

## 2021-06-08 DIAGNOSIS — Z5181 Encounter for therapeutic drug level monitoring: Secondary | ICD-10-CM | POA: Diagnosis not present

## 2021-06-08 DIAGNOSIS — Z952 Presence of prosthetic heart valve: Secondary | ICD-10-CM

## 2021-06-08 DIAGNOSIS — I480 Paroxysmal atrial fibrillation: Secondary | ICD-10-CM | POA: Diagnosis not present

## 2021-06-08 LAB — POCT INR: INR: 2.9 (ref 2.0–3.0)

## 2021-06-15 ENCOUNTER — Ambulatory Visit (INDEPENDENT_AMBULATORY_CARE_PROVIDER_SITE_OTHER): Payer: Medicare Other | Admitting: Cardiology

## 2021-06-15 DIAGNOSIS — Z952 Presence of prosthetic heart valve: Secondary | ICD-10-CM

## 2021-06-15 DIAGNOSIS — Z5181 Encounter for therapeutic drug level monitoring: Secondary | ICD-10-CM | POA: Diagnosis not present

## 2021-06-15 DIAGNOSIS — Z7901 Long term (current) use of anticoagulants: Secondary | ICD-10-CM

## 2021-06-15 LAB — POCT INR: INR: 3.6 — AB (ref 2.0–3.0)

## 2021-06-22 ENCOUNTER — Ambulatory Visit (INDEPENDENT_AMBULATORY_CARE_PROVIDER_SITE_OTHER): Payer: Medicare Other | Admitting: Cardiology

## 2021-06-22 DIAGNOSIS — Z5181 Encounter for therapeutic drug level monitoring: Secondary | ICD-10-CM

## 2021-06-22 DIAGNOSIS — Z952 Presence of prosthetic heart valve: Secondary | ICD-10-CM | POA: Diagnosis not present

## 2021-06-22 DIAGNOSIS — Z7901 Long term (current) use of anticoagulants: Secondary | ICD-10-CM

## 2021-06-22 LAB — POCT INR: INR: 2.5 (ref 2.0–3.0)

## 2021-06-29 ENCOUNTER — Ambulatory Visit (INDEPENDENT_AMBULATORY_CARE_PROVIDER_SITE_OTHER): Payer: Medicare Other | Admitting: Cardiology

## 2021-06-29 DIAGNOSIS — Z5181 Encounter for therapeutic drug level monitoring: Secondary | ICD-10-CM | POA: Diagnosis not present

## 2021-06-29 DIAGNOSIS — Z952 Presence of prosthetic heart valve: Secondary | ICD-10-CM

## 2021-06-29 DIAGNOSIS — Z7901 Long term (current) use of anticoagulants: Secondary | ICD-10-CM

## 2021-06-29 LAB — POCT INR: INR: 2.6 (ref 2.0–3.0)

## 2021-07-06 ENCOUNTER — Ambulatory Visit (INDEPENDENT_AMBULATORY_CARE_PROVIDER_SITE_OTHER): Payer: Medicare Other | Admitting: Internal Medicine

## 2021-07-06 DIAGNOSIS — Z952 Presence of prosthetic heart valve: Secondary | ICD-10-CM

## 2021-07-06 DIAGNOSIS — Z7901 Long term (current) use of anticoagulants: Secondary | ICD-10-CM | POA: Diagnosis not present

## 2021-07-06 DIAGNOSIS — I480 Paroxysmal atrial fibrillation: Secondary | ICD-10-CM | POA: Diagnosis not present

## 2021-07-06 DIAGNOSIS — Z5181 Encounter for therapeutic drug level monitoring: Secondary | ICD-10-CM | POA: Diagnosis not present

## 2021-07-06 LAB — POCT INR: INR: 2.3 (ref 2.0–3.0)

## 2021-07-10 LAB — PROTIME-INR: INR: 2.3 — AB (ref 0.80–1.20)

## 2021-07-13 LAB — POCT INR: INR: 2.4 (ref 2.0–3.0)

## 2021-07-14 ENCOUNTER — Ambulatory Visit (INDEPENDENT_AMBULATORY_CARE_PROVIDER_SITE_OTHER): Payer: Medicare Other | Admitting: Pharmacist Clinician (PhC)/ Clinical Pharmacy Specialist

## 2021-07-14 DIAGNOSIS — I480 Paroxysmal atrial fibrillation: Secondary | ICD-10-CM | POA: Diagnosis not present

## 2021-07-14 DIAGNOSIS — Z952 Presence of prosthetic heart valve: Secondary | ICD-10-CM

## 2021-07-14 DIAGNOSIS — Z7901 Long term (current) use of anticoagulants: Secondary | ICD-10-CM

## 2021-07-20 LAB — POCT INR: INR: 2.6 (ref 2.0–3.0)

## 2021-07-21 ENCOUNTER — Ambulatory Visit (INDEPENDENT_AMBULATORY_CARE_PROVIDER_SITE_OTHER): Payer: Medicare Other | Admitting: Internal Medicine

## 2021-07-21 DIAGNOSIS — Z952 Presence of prosthetic heart valve: Secondary | ICD-10-CM | POA: Diagnosis not present

## 2021-07-21 DIAGNOSIS — Z5181 Encounter for therapeutic drug level monitoring: Secondary | ICD-10-CM | POA: Diagnosis not present

## 2021-07-21 DIAGNOSIS — Z7901 Long term (current) use of anticoagulants: Secondary | ICD-10-CM

## 2021-07-28 ENCOUNTER — Ambulatory Visit (INDEPENDENT_AMBULATORY_CARE_PROVIDER_SITE_OTHER): Payer: Medicare Other | Admitting: Pharmacist Clinician (PhC)/ Clinical Pharmacy Specialist

## 2021-07-28 DIAGNOSIS — I480 Paroxysmal atrial fibrillation: Secondary | ICD-10-CM | POA: Diagnosis not present

## 2021-07-28 DIAGNOSIS — Z952 Presence of prosthetic heart valve: Secondary | ICD-10-CM

## 2021-07-28 DIAGNOSIS — Z7901 Long term (current) use of anticoagulants: Secondary | ICD-10-CM | POA: Diagnosis not present

## 2021-07-28 LAB — POCT INR: INR: 2.4 (ref 2.0–3.0)

## 2021-08-03 ENCOUNTER — Ambulatory Visit (INDEPENDENT_AMBULATORY_CARE_PROVIDER_SITE_OTHER): Payer: Medicare Other | Admitting: Cardiology

## 2021-08-03 DIAGNOSIS — Z7901 Long term (current) use of anticoagulants: Secondary | ICD-10-CM

## 2021-08-03 DIAGNOSIS — Z952 Presence of prosthetic heart valve: Secondary | ICD-10-CM | POA: Diagnosis not present

## 2021-08-03 DIAGNOSIS — I480 Paroxysmal atrial fibrillation: Secondary | ICD-10-CM | POA: Diagnosis not present

## 2021-08-03 DIAGNOSIS — Z5181 Encounter for therapeutic drug level monitoring: Secondary | ICD-10-CM | POA: Diagnosis not present

## 2021-08-03 LAB — POCT INR: INR: 3 (ref 2.0–3.0)

## 2021-08-10 LAB — POCT INR: INR: 2.9 (ref 2.0–3.0)

## 2021-08-11 ENCOUNTER — Telehealth: Payer: Self-pay | Admitting: *Deleted

## 2021-08-11 ENCOUNTER — Ambulatory Visit (INDEPENDENT_AMBULATORY_CARE_PROVIDER_SITE_OTHER): Payer: Medicare Other | Admitting: Cardiology

## 2021-08-11 DIAGNOSIS — Z5181 Encounter for therapeutic drug level monitoring: Secondary | ICD-10-CM

## 2021-08-11 NOTE — Patient Instructions (Addendum)
Description    Called and spoke to pt's son and instructed pt to continue taking warfarin 1 tablet daily except for 1/2 a tablet on Monday and Friday. Recheck INR in 1 week.

## 2021-08-11 NOTE — Telephone Encounter (Signed)
Called pt since INR was due on yesterday; there was no answer so left a message for pt/son to callback regarding testing. Will await a call back.

## 2021-08-17 ENCOUNTER — Ambulatory Visit (INDEPENDENT_AMBULATORY_CARE_PROVIDER_SITE_OTHER): Payer: Medicare Other | Admitting: Cardiology

## 2021-08-17 ENCOUNTER — Telehealth: Payer: Self-pay

## 2021-08-17 VITALS — BP 136/64 | HR 56 | Ht 62.0 in | Wt 155.9 lb

## 2021-08-17 DIAGNOSIS — I480 Paroxysmal atrial fibrillation: Secondary | ICD-10-CM

## 2021-08-17 DIAGNOSIS — I25118 Atherosclerotic heart disease of native coronary artery with other forms of angina pectoris: Secondary | ICD-10-CM | POA: Diagnosis not present

## 2021-08-17 DIAGNOSIS — E782 Mixed hyperlipidemia: Secondary | ICD-10-CM | POA: Diagnosis not present

## 2021-08-17 DIAGNOSIS — R0609 Other forms of dyspnea: Secondary | ICD-10-CM | POA: Diagnosis not present

## 2021-08-17 DIAGNOSIS — I255 Ischemic cardiomyopathy: Secondary | ICD-10-CM

## 2021-08-17 DIAGNOSIS — Z952 Presence of prosthetic heart valve: Secondary | ICD-10-CM

## 2021-08-17 DIAGNOSIS — I10 Essential (primary) hypertension: Secondary | ICD-10-CM

## 2021-08-17 DIAGNOSIS — Z7901 Long term (current) use of anticoagulants: Secondary | ICD-10-CM

## 2021-08-17 LAB — POCT INR: INR: 2.6 (ref 2.0–3.0)

## 2021-08-17 NOTE — Patient Instructions (Signed)
Medication Instructions:  Continue your current medications.   *If you need a refill on your cardiac medications before your next appointment, please call your pharmacy*   Lab Work: None ordered today.   Testing/Procedures: None ordered today.    Follow-Up: At [facility], you and your health needs are our priority.  As part of our continuing mission to provide you with exceptional heart care, we have created designated Provider Care Teams.  These Care Teams include your primary Cardiologist (physician) and Advanced Practice Providers (APPs -  Physician Assistants and Nurse Practitioners) who all work together to provide you with the care you need, when you need it.  We recommend signing up for the patient portal called "MyChart".  Sign up information is provided on this After Visit Summary.  MyChart is used to connect with patients for Virtual Visits (Telemedicine).  Patients are able to view lab/test results, encounter notes, upcoming appointments, etc.  Non-urgent messages can be sent to your provider as well.   To learn more about what you can do with MyChart, go to [email or web address].    Your next appointment:   6 month(s)  The format for your next appointment:   In Person  Provider:   Buford Dresser, MD    Other Instructions  Heart Healthy Diet Recommendations: A low-salt diet is recommended. Meats should be grilled, baked, or boiled. Avoid fried foods. Focus on lean protein sources like fish or chicken with vegetables and fruits. The American Heart Association is a Microbiologist!  American Heart Association Diet and Lifeystyle Recommendations   Exercise recommendations: The American Heart Association recommends 150 minutes of moderate intensity exercise weekly. Try 30 minutes of moderate intensity exercise 4-5 times per week. This could include walking, jogging, or swimming.  Important Information About Sugar

## 2021-08-17 NOTE — Telephone Encounter (Signed)
Lp's son message to check INR. 

## 2021-08-17 NOTE — Progress Notes (Signed)
Cardiology Office Note   Date:  08/19/2021   ID:  Tanner Ferguson, DOB 1927-08-30, MRN [id number]  PCP:  Wenda Low, MD  Cardiologist:  Buford Dresser, MD PhD  Referring MD: Wenda Low, MD   CC: follow up  History of Present Illness:    Tanner Ferguson is a 88 y.o. male with a hx of aortic stenosis s/p mechanical AVR (1995), CAD with MI 11/2017, ischemic cardiomyopathy, paroxysmal atrial fibrillation, obstructive sleep apnea, hypertension, hyperlipidemia  who is seen for follow up today. Please see my initial note from 03/09/18 for full summary of his cardiac history.  Additional pertinent information:  -seen by Dr. Vaughan Browner 09/28/18. Plan is for continued monitoring of the asbestosis given mild fibrosis, pleural plaques, and minimal restriction/diffusion defects on PFTs. Does not think imaging is typical for amiodarone toxicity, ok to continue. Possibly has mild asthma, planned for allergy referral. Started on AutoPAP for sleep apnea.  At his last appointment, his main concern was his breathing. Symbicort was the most helpful treatment. Checks INR at home and calls into coumadin clinic, has been a good experience for them.  Today: He is accompanied by his son. He reports that he has been feeling fine, but "not the greatest." He continues to have some shortness of breath.  Earlier today he had a fall while climbing the stairs. His son reports that he looked disoriented when he found him. He appears to be okay without any major injuries. Denies hitting his head.  Since his prior pneumonia, he has a lingering productive cough, and he requires assistance with showering and using the restroom.   He has been exercising on a stationary bike, as well as trying to walk. They note he also still feels unsteady on his feet.  Occasionally he will notice some LE edema.  He remains compliant with warfarin; his INR is reportedly stable.  He denies any palpitations, chest pain. No  lightheadedness, headaches, syncope, orthopnea, or PND.   Past Medical History:  Diagnosis Date   Asthma    CAD (coronary artery disease)    s/p NSTEMI 9/19. Cath in [place], [place] 10/19. Severe CAD not amenable to PCI. Not CABG candidate   CHF (congestive heart failure) (HCC)    EF 40-45% by echo due to iCM. EF 36% by Myoview 9/19   Dementia (HCC)    Essential tremor    Hyperlipidemia    Intolerant of statins   Hypertension    PAF (paroxysmal atrial fibrillation) ([place])    Pancytopenia ([place])    Follows with hematology   Prostate cancer ([place])    Seizure ([place])     Current Medications: Current Outpatient Medications on File Prior to Visit  Medication Sig   albuterol (VENTOLIN HFA) 108 (90 Base) MCG/ACT inhaler Inhale 1-2 puffs into the lungs every 4 (four) hours as needed for wheezing or shortness of breath.    amiodarone (PACERONE) 200 MG tablet Take 1/2 (one-half) tablet by mouth once daily   aspirin EC 81 MG tablet Take 1 tablet (81 mg total) by mouth daily with breakfast.   COVID-19 mRNA Vac-TriS, Pfizer, (PFIZER-BIONT COVID-19 VAC-TRIS) SUSP injection Inject into the muscle.   ipratropium (ATROVENT) 0.03 % nasal spray Place 2 sprays into both nostrils every 12 (twelve) hours.   primidone (MYSOLINE) 50 MG tablet Take 1 tablet (50 mg total) by mouth in the morning and at bedtime.   sodium chloride (OCEAN) 0.65 % SOLN nasal spray Place 1 spray into both nostrils as needed for congestion.  SYMBICORT 80-4.5 MCG/ACT inhaler Inhale 2 puffs by mouth twice daily   tamsulosin (FLOMAX) 0.4 MG CAPS capsule Take 0.4 mg by mouth daily after supper.    warfarin (COUMADIN) 2.5 MG tablet Take 0.5-1 tablet daily or as prescribed by Coumadin Clinic   rosuvastatin (CRESTOR) 20 MG tablet Take 1 tablet (20 mg total) by mouth every evening.   No current facility-administered medications on file prior to visit.     Allergies:   Penicillins   Social History   Tobacco Use   Smoking status: Never    Smokeless tobacco: Former    Types: Chew    Quit date: 10/25/2017  Vaping Use   Vaping Use: Never used  Substance Use Topics   Alcohol use: Not Currently    Family History: No FH of premature CAD  ROS:   Please see the history of present illness.   (+) Shortness of breath (+) Mechanical Fall (+) Productive cough (+) LE edema Additional pertinent ROS otherwise unremarkable.  EKGs/Labs/Other Studies Reviewed:    The following studies were reviewed today: Recent pulmonary notes/studies  CT Chest  06/13/2020: FINDINGS: Cardiovascular: Aortic atherosclerosis. Status post aortic valve replacement and probable graft repair of the tubular ascending thoracic aorta, maximum caliber 4.5 x 4.5 cm. Descending thoracic aorta measures up to 3.3 x 3.2 cm in caliber. Cardiomegaly. Extensive Three-vessel coronary artery calcifications and/or stents. No pericardial effusion.   Mediastinum/Nodes: No enlarged mediastinal, hilar, or axillary lymph nodes. Thyroid gland, trachea, and esophagus demonstrate no significant findings.   Lungs/Pleura: Redemonstrated mild pulmonary fibrosis in a pattern featuring irregular peripheral interstitial opacity, septal thickening, and probable small areas of subpleural bronchiolectasis in the lung bases. No evidence of honeycombing. Fibrotic findings are not significantly changed compared to prior examination dated 2020. No significant air trapping on expiratory phase imaging. Stable, definitively benign small nodules, for example a 4 mm nodule of the right upper lobe (series 7, image 73). Calcified bilateral pleural plaques. No pleural effusion or pneumothorax.   Upper Abdomen: No acute abnormality.   Musculoskeletal: No chest wall mass or suspicious bone lesions identified.   IMPRESSION: 1. Redemonstrated mild pulmonary fibrosis in a pattern featuring irregular peripheral interstitial opacity, septal thickening, and probable small areas of  subpleural bronchiolectasis in the lung bases. No evidence of honeycombing. Fibrotic findings are not significantly changed compared to prior examination dated 2020 and remain consistent with a "probable UIP pattern" by ATS pulmonary fibrosis criteria. 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 2. Cardiomegaly and coronary artery disease. 3. Status post aortic valve replacement and probable graft repair of the tubular ascending thoracic aorta, maximum caliber 4.5 x 4.5 cm, unchanged. Descending thoracic aorta measures up to 3.3 x 3.1 cm, unchanged. Ascending thoracic aortic aneurysm. Recommend semi-annual imaging followup by CTA or MRA and referral to cardiothoracic surgery if not already obtained. This recommendation follows 2010 ACCF/AHA/AATS/ACR/ASA/SCA/SCAI/SIR/STS/SVM Guidelines for the Diagnosis and Management of Patients With Thoracic Aortic Disease. Circulation. 2010; 121: I712-W580. Aortic aneurysm NOS (ICD10-I71.9)   Aortic Atherosclerosis (ICD10-I70.0).  Echo 11/08/2018:  1. The left ventricle has normal systolic function with an ejection  fraction of 60-65%. The cavity size was normal. There is mildly increased  left ventricular wall thickness with severe basal septal hypertrophy.  There is apical and apical anterior  akinesis.   2. The right ventricle has normal systolic function. The cavity was  mildly enlarged. There is no increase in right  ventricular wall thickness.   3. A Mechanical AVR valve is present in the aortic position. Procedure  Date: 1995 Normal aortic valve prosthesis. The prosthesis is not well  visualized. The mean AVG is 35mHg. There is trivial perivalvular AI.   4. There is mild to moderate mitral annular calcification present and  mild MR.   5. There is mild dilatation of the ascending aorta measuring 40 mm.   6. Right atrial size was  mildly dilated.   7. Left atrial size was moderately dilated.   EKG:  EKG is personally reviewed.   08/17/2021:  sinus bradycardia with first degree AV block, 56 bpm 03/23/21: sinus bradycardia with first degree AV block, 50 bpm 09/17/2020: Sinus bradycardia with 1st degree AV block, 52 bpm 09/20/19: sinus bradycardia at 51 bpm  Recent Labs: 10/18/2020: BUN 30; Creatinine, Ser 1.74; Hemoglobin 11.7; Platelets 105; Potassium 4.7; Sodium 131   Recent Lipid Panel No results found for: "CHOL", "TRIG", "HDL", "CHOLHDL", "VLDL", "LDLCALC", "LDLDIRECT"  Physical Exam:    VS:  BP 136/64 (BP Location: Right Arm, Patient Position: Sitting, Cuff Size: Normal)   Pulse (!) 56   Ht '5\' 2"'$  (1.575 m)   Wt 155 lb 14.4 oz (70.7 kg)   BMI 28.51 kg/m     Wt Readings from Last 3 Encounters:  08/17/21 155 lb 14.4 oz (70.7 kg)  03/23/21 157 lb (71.2 kg)  10/17/20 154 lb (69.9 kg)    GEN: Well nourished, well developed in no acute distress HEENT: Normal, moist mucous membranes NECK: No JVD CARDIAC: regular rhythm, normal S1 and crisp mechanical S2, no rubs or gallops. 1/6 systolic murmur. VASCULAR: Radial and DP pulses 2+ bilaterally. No carotid bruits RESPIRATORY:  Upper airways clear with bilateral velcro sounds at base. Mild rhonchi ABDOMEN: Soft, non-tender, non-distended MUSCULOSKELETAL:  Ambulates independently SKIN: Warm and dry, no significant LE edema NEUROLOGIC:  Alert and oriented x 3. No focal neuro deficits noted. Has bilateral tremor PSYCHIATRIC:  Normal affect    ASSESSMENT:    1. Coronary artery disease of native artery of native heart [place] stable angina pectoris [place]   2. Ischemic cardiomyopathy   3. Essential hypertension   4. Mixed hyperlipidemia   5. History of mechanical aortic valve replacement   6. Long term (current) use of anticoagulants   7. Paroxysmal atrial fibrillation (HCC)   8. DOE (dyspnea on exertion)    PLAN:    Shortness of breath, especially with  exertion -echo remarkably normal. Small area of scar noted, but normal global EF, normal filling pressures (normal IVC, normal RVSP, E/e' <15), normal mechanical aortic valve gradient. Suggests not a cardiac etiology -appreciate pulmonology evaluation. Recently breathing worsened with pneumonia, not yet back to baseline -encouraged exercise as tolerated   Paroxysmal atrial fibrillation -very symptomatic when in afib RVR, required 2 cardioversions 2019 -CHA2DS2/VAS Stroke Risk Points=4, but irrelevant as he needs to be on coumadin for his mechanical valve. -continue amiodarone. See prior extensive discussions regarding this, especially given his pulmonary disease. -[facility]  Prior chronic systolic heart failure, 2/2 ischemic cardiomyopathy. EF 40-45% before, now normalized on most recent echo -euvolemic -no beta blocker due to bradycardia -have tried ACEi and ARB, no room given baseline low blood pressures. -given baseline elevated potassium, no room for spironolactone   CAD s/p NSTEMI, with diffuse severe disease seen on cath -continue aspirin -continue rosuvastatin -resting sinus bradycardia. No room for beta blocker -medical management   History of mechanical AVR,  on coumadin -continue coumadin and aspirin -SBE prophylaxis with dental procedures, but has dentures in.  Plan for follow up: 6 mos or sooner as needed  Medication Adjustments/Labs and Tests Ordered: Current medicines are reviewed at length with the patient today.  Concerns regarding medicines are outlined above.   No orders of the defined types were placed in this encounter.  No orders of the defined types were placed in this encounter.  Patient Instructions  Medication Instructions:  Continue your current medications.   *If you need a refill on your cardiac medications before your next appointment, please call your pharmacy*   Lab Work: None ordered today.    Testing/Procedures: None ordered today.    Follow-Up: At [facility], you and your health needs are our priority.  As part of our continuing mission to provide you with exceptional heart care, we have created designated Provider Care Teams.  These Care Teams include your primary Cardiologist (physician) and Advanced Practice Providers (APPs -  Physician Assistants and Nurse Practitioners) who all work together to provide you with the care you need, when you need it.  We recommend signing up for the patient portal called "MyChart".  Sign up information is provided on this After Visit Summary.  MyChart is used to connect with patients for Virtual Visits (Telemedicine).  Patients are able to view lab/test results, encounter notes, upcoming appointments, etc.  Non-urgent messages can be sent to your provider as well.   To learn more about what you can do with MyChart, go to [email or web address].    Your next appointment:   6 month(s)  The format for your next appointment:   In Person  Provider:   Buford Dresser, MD    Other Instructions  Heart Healthy Diet Recommendations: A low-salt diet is recommended. Meats should be grilled, baked, or boiled. Avoid fried foods. Focus on lean protein sources like fish or chicken with vegetables and fruits. The American Heart Association is a Microbiologist!  American Heart Association Diet and Lifeystyle Recommendations   Exercise recommendations: The American Heart Association recommends 150 minutes of moderate intensity exercise weekly. Try 30 minutes of moderate intensity exercise 4-5 times per week. This could include walking, jogging, or swimming.  Important Information About Sugar         I,Mathew Stumpf,acting as a scribe for [employer], MD.,have documented all relevant documentation on the behalf of Buford Dresser, MD,as directed by  Buford Dresser, MD while in the presence of Buford Dresser, MD.  I, Buford Dresser, MD, have reviewed all documentation for this visit. The documentation on 08/19/21 for the exam, diagnosis, procedures, and orders are all accurate and complete.   Signed, Buford Dresser, MD PhD 08/19/2021     [place]

## 2021-08-18 ENCOUNTER — Telehealth: Payer: Self-pay

## 2021-08-18 NOTE — Telephone Encounter (Signed)
Lp's son message to check INR.  Have not received a faxed result.

## 2021-08-18 NOTE — Telephone Encounter (Signed)
Left message

## 2021-08-19 ENCOUNTER — Encounter (HOSPITAL_BASED_OUTPATIENT_CLINIC_OR_DEPARTMENT_OTHER): Payer: Self-pay | Admitting: Cardiology

## 2021-08-19 ENCOUNTER — Emergency Department (HOSPITAL_BASED_OUTPATIENT_CLINIC_OR_DEPARTMENT_OTHER): Payer: Medicare Other

## 2021-08-19 ENCOUNTER — Other Ambulatory Visit: Payer: Self-pay

## 2021-08-19 ENCOUNTER — Observation Stay (HOSPITAL_BASED_OUTPATIENT_CLINIC_OR_DEPARTMENT_OTHER)
Admission: EM | Admit: 2021-08-19 | Discharge: 2021-08-21 | Disposition: A | Discharge status: Home / Self Care | Payer: Medicare Other | Attending: Internal Medicine | Admitting: Internal Medicine

## 2021-08-19 ENCOUNTER — Ambulatory Visit (INDEPENDENT_AMBULATORY_CARE_PROVIDER_SITE_OTHER): Payer: Medicare Other | Admitting: *Deleted

## 2021-08-19 ENCOUNTER — Encounter (HOSPITAL_BASED_OUTPATIENT_CLINIC_OR_DEPARTMENT_OTHER): Payer: Self-pay | Admitting: Emergency Medicine

## 2021-08-19 DIAGNOSIS — R051 Acute cough: Secondary | ICD-10-CM | POA: Insufficient documentation

## 2021-08-19 DIAGNOSIS — D649 Anemia, unspecified: Secondary | ICD-10-CM | POA: Diagnosis present

## 2021-08-19 DIAGNOSIS — Z20822 Contact with and (suspected) exposure to covid-19: Secondary | ICD-10-CM | POA: Diagnosis not present

## 2021-08-19 DIAGNOSIS — Z7901 Long term (current) use of anticoagulants: Secondary | ICD-10-CM | POA: Insufficient documentation

## 2021-08-19 DIAGNOSIS — G319 Degenerative disease of nervous system, unspecified: Secondary | ICD-10-CM | POA: Diagnosis not present

## 2021-08-19 DIAGNOSIS — J0100 Acute maxillary sinusitis, unspecified: Secondary | ICD-10-CM | POA: Diagnosis not present

## 2021-08-19 DIAGNOSIS — J45901 Unspecified asthma with (acute) exacerbation: Secondary | ICD-10-CM | POA: Diagnosis not present

## 2021-08-19 DIAGNOSIS — F0390 Unspecified dementia without behavioral disturbance: Secondary | ICD-10-CM | POA: Insufficient documentation

## 2021-08-19 DIAGNOSIS — I509 Heart failure, unspecified: Secondary | ICD-10-CM | POA: Insufficient documentation

## 2021-08-19 DIAGNOSIS — E871 Hypo-osmolality and hyponatremia: Secondary | ICD-10-CM | POA: Diagnosis present

## 2021-08-19 DIAGNOSIS — G4733 Obstructive sleep apnea (adult) (pediatric): Secondary | ICD-10-CM

## 2021-08-19 DIAGNOSIS — R778 Other specified abnormalities of plasma proteins: Secondary | ICD-10-CM | POA: Insufficient documentation

## 2021-08-19 DIAGNOSIS — R059 Cough, unspecified: Secondary | ICD-10-CM | POA: Diagnosis not present

## 2021-08-19 DIAGNOSIS — I480 Paroxysmal atrial fibrillation: Secondary | ICD-10-CM | POA: Insufficient documentation

## 2021-08-19 DIAGNOSIS — Z043 Encounter for examination and observation following other accident: Secondary | ICD-10-CM | POA: Diagnosis not present

## 2021-08-19 DIAGNOSIS — Z7982 Long term (current) use of aspirin: Secondary | ICD-10-CM | POA: Insufficient documentation

## 2021-08-19 DIAGNOSIS — J189 Pneumonia, unspecified organism: Secondary | ICD-10-CM | POA: Diagnosis not present

## 2021-08-19 DIAGNOSIS — I130 Hypertensive heart and chronic kidney disease with heart failure and stage 1 through stage 4 chronic kidney disease, or unspecified chronic kidney disease: Secondary | ICD-10-CM | POA: Insufficient documentation

## 2021-08-19 DIAGNOSIS — N1832 Chronic kidney disease, stage 3b: Secondary | ICD-10-CM | POA: Diagnosis not present

## 2021-08-19 DIAGNOSIS — R7989 Other specified abnormal findings of blood chemistry: Secondary | ICD-10-CM

## 2021-08-19 DIAGNOSIS — Z87891 Personal history of nicotine dependence: Secondary | ICD-10-CM | POA: Diagnosis not present

## 2021-08-19 DIAGNOSIS — I2510 Atherosclerotic heart disease of native coronary artery without angina pectoris: Secondary | ICD-10-CM | POA: Diagnosis not present

## 2021-08-19 DIAGNOSIS — R0602 Shortness of breath: Secondary | ICD-10-CM | POA: Diagnosis not present

## 2021-08-19 DIAGNOSIS — Z79899 Other long term (current) drug therapy: Secondary | ICD-10-CM | POA: Insufficient documentation

## 2021-08-19 DIAGNOSIS — D696 Thrombocytopenia, unspecified: Secondary | ICD-10-CM | POA: Diagnosis present

## 2021-08-19 DIAGNOSIS — Z952 Presence of prosthetic heart valve: Secondary | ICD-10-CM

## 2021-08-19 DIAGNOSIS — Z8546 Personal history of malignant neoplasm of prostate: Secondary | ICD-10-CM | POA: Insufficient documentation

## 2021-08-19 DIAGNOSIS — B9689 Other specified bacterial agents as the cause of diseases classified elsewhere: Secondary | ICD-10-CM

## 2021-08-19 DIAGNOSIS — I10 Essential (primary) hypertension: Secondary | ICD-10-CM | POA: Diagnosis present

## 2021-08-19 DIAGNOSIS — J320 Chronic maxillary sinusitis: Secondary | ICD-10-CM | POA: Diagnosis not present

## 2021-08-19 DIAGNOSIS — I4891 Unspecified atrial fibrillation: Secondary | ICD-10-CM | POA: Diagnosis present

## 2021-08-19 LAB — CBC WITH DIFFERENTIAL/PLATELET
Abs Immature Granulocytes: 0.02 10*3/uL (ref 0.00–0.07)
Basophils Absolute: 0 10*3/uL (ref 0.0–0.1)
Basophils Relative: 0 %
Eosinophils Absolute: 0 10*3/uL (ref 0.0–0.5)
Eosinophils Relative: 1 %
HCT: 34.4 % — ABNORMAL LOW (ref 39.0–52.0)
Hemoglobin: 11.3 g/dL — ABNORMAL LOW (ref 13.0–17.0)
Immature Granulocytes: 1 %
Lymphocytes Relative: 16 %
Lymphs Abs: 0.7 10*3/uL (ref 0.7–4.0)
MCH: 29.7 pg (ref 26.0–34.0)
MCHC: 32.8 g/dL (ref 30.0–36.0)
MCV: 90.3 fL (ref 80.0–100.0)
Monocytes Absolute: 0.5 10*3/uL (ref 0.1–1.0)
Monocytes Relative: 11 %
Neutro Abs: 3.1 10*3/uL (ref 1.7–7.7)
Neutrophils Relative %: 71 %
Platelets: 110 10*3/uL — ABNORMAL LOW (ref 150–400)
RBC: 3.81 MIL/uL — ABNORMAL LOW (ref 4.22–5.81)
RDW: 15.2 % (ref 11.5–15.5)
Smear Review: ADEQUATE
WBC: 4.3 10*3/uL (ref 4.0–10.5)
nRBC: 0 % (ref 0.0–0.2)

## 2021-08-19 LAB — BRAIN NATRIURETIC PEPTIDE: B Natriuretic Peptide: 243.2 pg/mL — ABNORMAL HIGH (ref 0.0–100.0)

## 2021-08-19 LAB — COMPREHENSIVE METABOLIC PANEL
ALT: 16 U/L (ref 0–44)
AST: 26 U/L (ref 15–41)
Albumin: 3.8 g/dL (ref 3.5–5.0)
Alkaline Phosphatase: 51 U/L (ref 38–126)
Anion gap: 6 (ref 5–15)
BUN: 26 mg/dL — ABNORMAL HIGH (ref 8–23)
CO2: 23 mmol/L (ref 22–32)
Calcium: 8.7 mg/dL — ABNORMAL LOW (ref 8.9–10.3)
Chloride: 101 mmol/L (ref 98–111)
Creatinine, Ser: 1.71 mg/dL — ABNORMAL HIGH (ref 0.61–1.24)
GFR, Estimated: 37 mL/min — ABNORMAL LOW (ref >60)
Glucose, Bld: 112 mg/dL — ABNORMAL HIGH (ref 70–99)
Potassium: 4.2 mmol/L (ref 3.5–5.1)
Sodium: 130 mmol/L — ABNORMAL LOW (ref 135–145)
Total Bilirubin: 0.9 mg/dL (ref 0.3–1.2)
Total Protein: 8.2 g/dL — ABNORMAL HIGH (ref 6.5–8.1)

## 2021-08-19 LAB — TROPONIN I (HIGH SENSITIVITY)
Troponin I (High Sensitivity): 41 ng/L — ABNORMAL HIGH (ref <18)
Troponin I (High Sensitivity): 49 ng/L — ABNORMAL HIGH (ref <18)

## 2021-08-19 LAB — PROTIME-INR
INR: 2.7 — ABNORMAL HIGH (ref 0.8–1.2)
Prothrombin Time: 28.2 seconds — ABNORMAL HIGH (ref 11.4–15.2)

## 2021-08-19 LAB — LACTIC ACID, PLASMA: Lactic Acid, Venous: 1.2 mmol/L (ref 0.5–1.9)

## 2021-08-19 LAB — SARS CORONAVIRUS 2 BY RT PCR: SARS Coronavirus 2 by RT PCR: NEGATIVE

## 2021-08-19 MED ORDER — SODIUM CHLORIDE 0.9 % IV SOLN
1.0000 g | Freq: Once | INTRAVENOUS | Status: AC
  Administered 2021-08-19: 1 g via INTRAVENOUS

## 2021-08-19 MED ORDER — SODIUM CHLORIDE 0.9 % IV SOLN
500.0000 mg | Freq: Once | INTRAVENOUS | Status: AC
  Administered 2021-08-19: 500 mg via INTRAVENOUS

## 2021-08-19 MED ORDER — SODIUM CHLORIDE 0.9 % IV SOLN
INTRAVENOUS | Status: AC
  Filled 2021-08-19: qty 5

## 2021-08-19 MED ORDER — SODIUM CHLORIDE 0.9 % IV SOLN
INTRAVENOUS | Status: AC
  Filled 2021-08-19: qty 10

## 2021-08-19 NOTE — ED Provider Notes (Signed)
[facility] Provider Note   CSN: [id number] Arrival date & time: 08/19/21  2033     History  Chief Complaint  Patient presents with   Shortness of Breath    Tanner Ferguson is a 88 y.o. male.  The history is provided by the patient. No language interpreter was used.  Shortness of Breath Severity:  Moderate Onset quality:  Gradual Duration:  3 days Timing:  Constant Progression:  Worsening Chronicity:  New Context: URI   Relieved by:  Nothing Worsened by:  Coughing Associated symptoms: cough and sputum production   Associated symptoms: no abdominal pain, no chest pain, no fever, no headaches, no neck pain, no vomiting and no wheezing        Home Medications Prior to Admission medications   Medication Sig Start Date End Date Taking? Authorizing Provider  albuterol (VENTOLIN HFA) 108 (90 Base) MCG/ACT inhaler Inhale 1-2 puffs into the lungs every 4 (four) hours as needed for wheezing or shortness of breath.  08/01/18   [provider]  amiodarone (PACERONE) 200 MG tablet Take 1/2 (one-half) tablet by mouth once daily 01/12/21   Buford Dresser, MD  aspirin EC 81 MG tablet Take 1 tablet (81 mg total) by mouth daily with breakfast. 10/21/18   Roxan Hockey, MD  COVID-19 mRNA Vac-TriS, Pfizer, (PFIZER-BIONT COVID-19 VAC-TRIS) SUSP injection Inject into the muscle. 06/13/20   Carlyle Basques, MD  ipratropium (ATROVENT) 0.03 % nasal spray Place 2 sprays into both nostrils every 12 (twelve) hours. 07/17/19   Darr, Edison Nasuti, PA-C  primidone (MYSOLINE) 50 MG tablet Take 1 tablet (50 mg total) by mouth in the morning and at bedtime. 10/29/19   Tat, Eustace Quail, DO  rosuvastatin (CRESTOR) 20 MG tablet Take 1 tablet (20 mg total) by mouth every evening. 10/21/18 03/23/21  Roxan Hockey, MD  sodium chloride (OCEAN) 0.65 % SOLN nasal spray Place 1 spray into both nostrils as needed for congestion. 07/17/19   Darr, Edison Nasuti, PA-C  SYMBICORT 80-4.5 MCG/ACT  inhaler Inhale 2 puffs by mouth twice daily 10/22/19   Mannam, Hart Robinsons, MD  tamsulosin (FLOMAX) 0.4 MG CAPS capsule Take 0.4 mg by mouth daily after supper.     [provider]  warfarin (COUMADIN) 2.5 MG tablet Take 0.5-1 tablet daily or as prescribed by [facility] 04/23/20   Buford Dresser, MD      Allergies    Penicillins    Review of Systems   Review of Systems  Constitutional:  Positive for fatigue. Negative for chills and fever.  HENT:  Positive for congestion.   Eyes:  Negative for visual disturbance.  Respiratory:  Positive for cough, sputum production and shortness of breath. Negative for chest tightness, wheezing and stridor.   Cardiovascular:  Negative for chest pain and palpitations.  Gastrointestinal:  Negative for abdominal pain, constipation, diarrhea, nausea and vomiting.  Genitourinary:  Negative for flank pain.  Musculoskeletal:  Negative for back pain, neck pain and neck stiffness.  Neurological:  Negative for light-headedness and headaches.  Psychiatric/Behavioral:  Negative for agitation.   All other systems reviewed and are negative.   Physical Exam Updated Vital Signs Ht 5' (1.524 m)   Wt 70.3 kg   BMI 30.27 kg/m  Physical Exam Vitals and nursing note reviewed.  Constitutional:      General: He is not in acute distress.    Appearance: He is well-developed. He is ill-appearing. He is not toxic-appearing or diaphoretic.  HENT:     Head: Normocephalic and  atraumatic.     Mouth/Throat:     Mouth: Mucous membranes are moist.  Eyes:     Conjunctiva/sclera: Conjunctivae normal.     Pupils: Pupils are equal, round, and reactive to light.  Cardiovascular:     Rate and Rhythm: Normal rate and regular rhythm.     Heart sounds: Murmur heard.  Pulmonary:     Effort: Pulmonary effort is normal. Tachypnea present. No respiratory distress.     Breath sounds: Rhonchi and rales present.  Chest:     Chest wall: No tenderness.  Abdominal:      Palpations: Abdomen is soft.     Tenderness: There is no abdominal tenderness.  Musculoskeletal:        General: No swelling.     Cervical back: Neck supple.     Right lower leg: No tenderness. Edema present.     Left lower leg: No tenderness. Edema present.  Skin:    General: Skin is warm and dry.     Capillary Refill: Capillary refill takes less than 2 seconds.     Findings: No erythema.  Neurological:     General: No focal deficit present.     Mental Status: He is alert.  Psychiatric:        Mood and Affect: Mood normal.     ED Results / Procedures / Treatments   Labs (all labs ordered are listed, but only abnormal results are displayed) Labs Reviewed  PROTIME-INR - Abnormal; Notable for the following components:      Result Value   Prothrombin Time 28.2 (*)    INR 2.7 (*)    All other components within normal limits  CBC WITH DIFFERENTIAL/PLATELET - Abnormal; Notable for the following components:   RBC 3.81 (*)    Hemoglobin 11.3 (*)    HCT 34.4 (*)    Platelets 110 (*)    All other components within normal limits  COMPREHENSIVE METABOLIC PANEL - Abnormal; Notable for the following components:   Sodium 130 (*)    Glucose, Bld 112 (*)    BUN 26 (*)    Creatinine, Ser 1.71 (*)    Calcium 8.7 (*)    Total Protein 8.2 (*)    GFR, Estimated 37 (*)    All other components within normal limits  BRAIN NATRIURETIC PEPTIDE - Abnormal; Notable for the following components:   B Natriuretic Peptide 243.2 (*)    All other components within normal limits  TROPONIN I (HIGH SENSITIVITY) - Abnormal; Notable for the following components:   Troponin I (High Sensitivity) 41 (*)    All other components within normal limits  SARS CORONAVIRUS 2 BY RT PCR  CULTURE, BLOOD (ROUTINE X 2)  CULTURE, BLOOD (ROUTINE X 2)  LACTIC ACID, PLASMA  LACTIC ACID, PLASMA  TROPONIN I (HIGH SENSITIVITY)    EKG EKG Interpretation  Date/Time:  Wednesday August 19 2021 20:46:08 EDT Ventricular Rate:   65 PR Interval:    QRS Duration: 116 QT Interval:  373 QTC Calculation: 388 R Axis:   -33 Text Interpretation: sinus rhythm Left ventricular hypertrophy when compared to prior, similar appearance. No STEMI Confirmed by Antony Blackbird [phone]) on 08/19/2021 8:49:06 PM  Radiology CT Head Wo Contrast  Result Date: 08/19/2021 CLINICAL DATA:  Status post fall. EXAM: CT HEAD WITHOUT CONTRAST TECHNIQUE: Contiguous axial images were obtained from the base of the skull through the vertex without intravenous contrast. RADIATION DOSE REDUCTION: This exam was performed according to the departmental dose-optimization program which includes  automated exposure control, adjustment of the mA and/or kV according to patient size and/or use of iterative reconstruction technique. COMPARISON:  None Available. FINDINGS: Brain: There is mild cerebral atrophy with widening of the extra-axial spaces and ventricular dilatation. There are areas of decreased attenuation within the white matter tracts of the supratentorial brain, consistent with microvascular disease changes. Vascular: No hyperdense vessel or unexpected calcification. Skull: Normal. Negative for fracture or focal lesion. Sinuses/Orbits: There is mild left maxillary sinus mucosal thickening. Marked severity bilateral ethmoid sinus mucosal thickening is also seen. Other: None. IMPRESSION: 1. No acute intracranial abnormality. 2. Generalized cerebral atrophy and microvascular disease changes of the supratentorial brain. 3. Marked severity bilateral ethmoid sinus and mild left maxillary sinus disease. Electronically Signed   By: Virgina Norfolk M.D.   On: 08/19/2021 21:33   DG Chest 2 View  Result Date: 08/19/2021 CLINICAL DATA:  Shortness of breath for 2 days EXAM: CHEST - 2 VIEW COMPARISON:  04/27/2021 FINDINGS: Cardiac shadow is enlarged but stable. Postsurgical changes are seen. Aortic calcifications are noted. Lungs are well aerated bilaterally. Stable blunting  of the left costophrenic angle is noted. No acute infiltrate or sizable effusion is seen. No acute bony abnormality is noted. IMPRESSION: No acute abnormality Electronically Signed   By: Inez Catalina M.D.   On: 08/19/2021 21:23    Procedures Procedures    Medications Ordered in ED Medications  cefTRIAXone (ROCEPHIN) 1 g in sodium chloride 0.9 % 100 mL IVPB (has no administration in time range)  azithromycin (ZITHROMAX) 500 mg in sodium chloride 0.9 % 250 mL IVPB (has no administration in time range)    ED Course/ Medical Decision Making/ A&P                           Medical Decision Making Amount and/or Complexity of Data Reviewed Labs: ordered. Radiology: ordered.  Risk Decision regarding hospitalization.    Tanner Ferguson is a 88 y.o. male with a past medical history significant for atrial fibrillation and mechanical heart valve replacement on warfarin therapy, CHF, hyperlipidemia, hypertension, dementia, seizures, and pneumonia several months ago who presents with worsening shortness of breath and cough.  According to patient and family, for the last few days patient has worsened breathing and productive cough.  He is not had fevers or chills but is feeling tired.  Patient was warm to the touch on arrival.  Family reports that while seeing the cardiologist yesterday, his exam was reassuring however patient did have a fall to the ground.  Patient did get his hat knocked off and thinks he hit his head but is denying significant headache but he is on the blood thinners.  He is denying nausea, vomiting, or diarrhea.  He reports intermittent chronic constipation especially when he has other problems like pneumonia.  Not complaining of pain in his arms or legs but he does have some more edema in his legs than normal.  On exam, patient has very noisy breathing with rhonchi.  Also some mild rales.  No wheezing appreciated on my auscultation.  Chest and abdomen nontender.  Head nontender.  Neck  nontender.  Back nontender.  Patient has chronic tremors.  EKG showed sinus rhythm on my evaluation.  No STEMI.  Given the patient's evaluation I am most concerned about acute pneumonia.  As he was warm to the touch, we will get a rectal temp.  Given the new edema in the legs, we will get a  BNP.  We will get chest x-ray and labs.  Given the patient's age and suspected pneumonia, anticipate patient may require admission after work-up.  10:31 PM Patient's work-up began to return surprisingly reassuring although his exam is still very concerning for developing pneumonia.  X-ray did not show infiltrate.  BNP elevated but improved from prior.  Troponin was elevated, will trend.  Suspect main ischemia in the setting of this clinical pneumonia.  Creatinine similar to prior.  Mild hyponatremia similar to prior.  No leukocytosis or lactic acidosis.  CT head did not show acute bleed or injury from his fall.  No rib fracture seen on x-ray.  Lactic acid nonelevated.  I reassessed patient and had a conversation with patient and son.  Given the terrible sounding lungs on exam and his subjective chills now with a temperature of 99.6 I am still concerned about pneumonia.  His port score was calculated as 111 with a 8.2 to 9.3% mortality and hospitalization recommended.  Family is very concerned about him going home especially as his oxygen saturations have dipped to around 90% on room air at times.  We will treat him with antibiotics for pneumonia and admit him overnight for further monitoring and management.  We will also need his troponin trended.  Chart review shows he has tolerated Rocephin so we will give this with azithromycin and admit for further management.  10:52 PM Spoke to Dr. Bridgett Larsson with the hospitalist team who requested we get a CT chest without contrast to get a better look at what could be clinical pneumonia.  We will still get the antibiotics as the patient reported he was breathing worse and choking  when he was lying flat.  We will continue to trend his troponins and monitor him and if his oxygen starts to decrease we will start him on oxygen.  Care transferred to oncoming team to await results of CT scan and delta troponin but I still anticipate admission for what I suspect is clinical diagnosis of pneumonia.         Final Clinical Impression(s) / ED Diagnoses Final diagnoses:  Community acquired pneumonia, unspecified laterality  SOB (shortness of breath)  Acute cough     Clinical Impression: 1. Community acquired pneumonia, unspecified laterality   2. SOB (shortness of breath)   3. Acute cough     Disposition: Care transferred to oncoming team to await reassessment after CT scan until troponin.  Anticipate admission.   This note was prepared with assistance of Systems analyst. Occasional wrong-word or sound-a-like substitutions may have occurred due to the inherent limitations of voice recognition software.     Andreah Goheen, Gwenyth Allegra, MD 08/19/21 [phone]

## 2021-08-19 NOTE — ED Triage Notes (Addendum)
Pt reports SHOB since yesterday; denies pain; son sts he had a fall yesterday

## 2021-08-19 NOTE — ED Notes (Signed)
Patient transported to CT 

## 2021-08-19 NOTE — Progress Notes (Signed)
Patient's son Tanner Ferguson prefers that his father use his own CPAP and mask from home.  The patient is compliant with his CPAP and mask and is accustomed to settings.

## 2021-08-20 ENCOUNTER — Encounter (HOSPITAL_COMMUNITY): Payer: Self-pay | Admitting: Internal Medicine

## 2021-08-20 DIAGNOSIS — J019 Acute sinusitis, unspecified: Secondary | ICD-10-CM

## 2021-08-20 DIAGNOSIS — J45901 Unspecified asthma with (acute) exacerbation: Principal | ICD-10-CM

## 2021-08-20 DIAGNOSIS — N1832 Chronic kidney disease, stage 3b: Secondary | ICD-10-CM | POA: Diagnosis not present

## 2021-08-20 DIAGNOSIS — I509 Heart failure, unspecified: Secondary | ICD-10-CM | POA: Diagnosis not present

## 2021-08-20 DIAGNOSIS — I2584 Coronary atherosclerosis due to calcified coronary lesion: Secondary | ICD-10-CM

## 2021-08-20 DIAGNOSIS — Z8546 Personal history of malignant neoplasm of prostate: Secondary | ICD-10-CM | POA: Diagnosis not present

## 2021-08-20 DIAGNOSIS — B9689 Other specified bacterial agents as the cause of diseases classified elsewhere: Secondary | ICD-10-CM | POA: Diagnosis not present

## 2021-08-20 DIAGNOSIS — R0602 Shortness of breath: Secondary | ICD-10-CM | POA: Diagnosis present

## 2021-08-20 DIAGNOSIS — R051 Acute cough: Secondary | ICD-10-CM | POA: Diagnosis not present

## 2021-08-20 DIAGNOSIS — D649 Anemia, unspecified: Secondary | ICD-10-CM | POA: Diagnosis not present

## 2021-08-20 DIAGNOSIS — I2510 Atherosclerotic heart disease of native coronary artery without angina pectoris: Secondary | ICD-10-CM

## 2021-08-20 DIAGNOSIS — J0100 Acute maxillary sinusitis, unspecified: Secondary | ICD-10-CM | POA: Diagnosis not present

## 2021-08-20 DIAGNOSIS — Z20822 Contact with and (suspected) exposure to covid-19: Secondary | ICD-10-CM | POA: Diagnosis not present

## 2021-08-20 DIAGNOSIS — E871 Hypo-osmolality and hyponatremia: Secondary | ICD-10-CM

## 2021-08-20 DIAGNOSIS — I480 Paroxysmal atrial fibrillation: Secondary | ICD-10-CM

## 2021-08-20 DIAGNOSIS — J189 Pneumonia, unspecified organism: Secondary | ICD-10-CM | POA: Diagnosis not present

## 2021-08-20 DIAGNOSIS — Z9989 Dependence on other enabling machines and devices: Secondary | ICD-10-CM

## 2021-08-20 DIAGNOSIS — Z7982 Long term (current) use of aspirin: Secondary | ICD-10-CM | POA: Diagnosis not present

## 2021-08-20 DIAGNOSIS — Z952 Presence of prosthetic heart valve: Secondary | ICD-10-CM

## 2021-08-20 DIAGNOSIS — Z79899 Other long term (current) drug therapy: Secondary | ICD-10-CM | POA: Diagnosis not present

## 2021-08-20 DIAGNOSIS — I130 Hypertensive heart and chronic kidney disease with heart failure and stage 1 through stage 4 chronic kidney disease, or unspecified chronic kidney disease: Secondary | ICD-10-CM | POA: Diagnosis not present

## 2021-08-20 DIAGNOSIS — R778 Other specified abnormalities of plasma proteins: Secondary | ICD-10-CM | POA: Diagnosis not present

## 2021-08-20 DIAGNOSIS — D696 Thrombocytopenia, unspecified: Secondary | ICD-10-CM

## 2021-08-20 DIAGNOSIS — Z87891 Personal history of nicotine dependence: Secondary | ICD-10-CM | POA: Diagnosis not present

## 2021-08-20 DIAGNOSIS — Z7901 Long term (current) use of anticoagulants: Secondary | ICD-10-CM | POA: Diagnosis not present

## 2021-08-20 DIAGNOSIS — G4733 Obstructive sleep apnea (adult) (pediatric): Secondary | ICD-10-CM | POA: Diagnosis not present

## 2021-08-20 DIAGNOSIS — F0390 Unspecified dementia without behavioral disturbance: Secondary | ICD-10-CM | POA: Diagnosis not present

## 2021-08-20 LAB — CBC
HCT: 32.8 % — ABNORMAL LOW (ref 39.0–52.0)
Hemoglobin: 10.9 g/dL — ABNORMAL LOW (ref 13.0–17.0)
MCH: 29.9 pg (ref 26.0–34.0)
MCHC: 33.2 g/dL (ref 30.0–36.0)
MCV: 90.1 fL (ref 80.0–100.0)
Platelets: 103 10*3/uL — ABNORMAL LOW (ref 150–400)
RBC: 3.64 MIL/uL — ABNORMAL LOW (ref 4.22–5.81)
RDW: 15 % (ref 11.5–15.5)
WBC: 3.9 10*3/uL — ABNORMAL LOW (ref 4.0–10.5)
nRBC: 0 % (ref 0.0–0.2)

## 2021-08-20 LAB — LACTIC ACID, PLASMA: Lactic Acid, Venous: 0.6 mmol/L (ref 0.5–1.9)

## 2021-08-20 LAB — BASIC METABOLIC PANEL
Anion gap: 6 (ref 5–15)
BUN: 21 mg/dL (ref 8–23)
CO2: 23 mmol/L (ref 22–32)
Calcium: 8.6 mg/dL — ABNORMAL LOW (ref 8.9–10.3)
Chloride: 104 mmol/L (ref 98–111)
Creatinine, Ser: 1.58 mg/dL — ABNORMAL HIGH (ref 0.61–1.24)
GFR, Estimated: 41 mL/min — ABNORMAL LOW (ref >60)
Glucose, Bld: 105 mg/dL — ABNORMAL HIGH (ref 70–99)
Potassium: 4 mmol/L (ref 3.5–5.1)
Sodium: 133 mmol/L — ABNORMAL LOW (ref 135–145)

## 2021-08-20 LAB — TROPONIN I (HIGH SENSITIVITY): Troponin I (High Sensitivity): 50 ng/L — ABNORMAL HIGH (ref <18)

## 2021-08-20 MED ORDER — WARFARIN - PHARMACIST DOSING INPATIENT: Freq: Every day | Status: DC

## 2021-08-20 MED ORDER — WARFARIN 1.25 MG HALF TABLET
1.2500 mg | ORAL_TABLET | ORAL | Status: DC
  Filled 2021-08-20: qty 1

## 2021-08-20 MED ORDER — SODIUM CHLORIDE 0.9% FLUSH
3.0000 mL | Freq: Two times a day (BID) | INTRAVENOUS | Status: DC
  Administered 2021-08-20 (×2): 3 mL via INTRAVENOUS

## 2021-08-20 MED ORDER — PREDNISONE 20 MG PO TABS
40.0000 mg | ORAL_TABLET | Freq: Every day | ORAL | Status: DC
  Administered 2021-08-20 – 2021-08-21 (×2): 40 mg via ORAL
  Filled 2021-08-20 (×2): qty 2

## 2021-08-20 MED ORDER — SODIUM CHLORIDE 0.9 % IV SOLN
1.0000 g | INTRAVENOUS | Status: DC
  Administered 2021-08-20: 1 g via INTRAVENOUS
  Filled 2021-08-20: qty 10

## 2021-08-20 MED ORDER — ACETAMINOPHEN 325 MG PO TABS: 650.0000 mg | ORAL_TABLET | Freq: Four times a day (QID) | ORAL | Status: DC | PRN

## 2021-08-20 MED ORDER — ALBUTEROL SULFATE (2.5 MG/3ML) 0.083% IN NEBU: 2.5000 mg | INHALATION_SOLUTION | RESPIRATORY_TRACT | Status: DC | PRN

## 2021-08-20 MED ORDER — ACETAMINOPHEN 650 MG RE SUPP: 650.0000 mg | Freq: Four times a day (QID) | RECTAL | Status: DC | PRN

## 2021-08-20 MED ORDER — AMIODARONE HCL 200 MG PO TABS
100.0000 mg | ORAL_TABLET | Freq: Every day | ORAL | Status: DC
  Administered 2021-08-20: 100 mg via ORAL
  Filled 2021-08-20 (×2): qty 1

## 2021-08-20 MED ORDER — SODIUM CHLORIDE 0.9 % IV SOLN: 1.0000 g | INTRAVENOUS | Status: DC

## 2021-08-20 MED ORDER — SENNOSIDES-DOCUSATE SODIUM 8.6-50 MG PO TABS: 1.0000 | ORAL_TABLET | Freq: Every evening | ORAL | Status: DC | PRN

## 2021-08-20 MED ORDER — ROSUVASTATIN CALCIUM 20 MG PO TABS
20.0000 mg | ORAL_TABLET | Freq: Every evening | ORAL | Status: DC
  Administered 2021-08-20: 20 mg via ORAL
  Filled 2021-08-20: qty 1

## 2021-08-20 MED ORDER — TAMSULOSIN HCL 0.4 MG PO CAPS
0.4000 mg | ORAL_CAPSULE | Freq: Every day | ORAL | Status: DC
  Administered 2021-08-20: 0.4 mg via ORAL
  Filled 2021-08-20: qty 1

## 2021-08-20 MED ORDER — ASPIRIN 81 MG PO TBEC
81.0000 mg | DELAYED_RELEASE_TABLET | Freq: Every day | ORAL | Status: DC
  Administered 2021-08-20 – 2021-08-21 (×2): 81 mg via ORAL
  Filled 2021-08-20 (×2): qty 1

## 2021-08-20 MED ORDER — WARFARIN SODIUM 2.5 MG PO TABS
2.5000 mg | ORAL_TABLET | ORAL | Status: DC
  Administered 2021-08-20: 2.5 mg via ORAL
  Filled 2021-08-20: qty 1

## 2021-08-20 MED ORDER — SALINE SPRAY 0.65 % NA SOLN
1.0000 | NASAL | Status: DC | PRN
Start: 2021-08-20 — End: 2021-08-21

## 2021-08-20 MED ORDER — IPRATROPIUM-ALBUTEROL 0.5-2.5 (3) MG/3ML IN SOLN
3.0000 mL | Freq: Once | RESPIRATORY_TRACT | Status: AC
  Administered 2021-08-20: 3 mL via RESPIRATORY_TRACT
  Filled 2021-08-20: qty 3

## 2021-08-20 NOTE — Progress Notes (Signed)
ANTICOAGULATION CONSULT NOTE - Initial Consult  Pharmacy Consult for Coumadin Indication:  AVR/PAF  Allergies  Allergen Reactions   Penicillins Rash    Patient Measurements: Height: 5' (152.4 cm) Weight: 70.3 kg (155 lb) IBW/kg (Calculated) : 50  Vital Signs: Temp: 98.2 F (36.8 C) (06/22 0328) Temp Source: Oral (06/22 0328) BP: 172/86 (06/22 0328) Pulse Rate: 67 (06/22 0328)  Labs: Recent Labs    08/19/21 2052 08/19/21 2256  HGB 11.3*  --   HCT 34.4*  --   PLT 110*  --   LABPROT 28.2*  --   INR 2.7*  --   CREATININE 1.71*  --   TROPONINIHS 41* 49*    Estimated Creatinine Clearance: 23.1 mL/min (A) (by C-G formula based on SCr of 1.71 mg/dL (H)).   Medical History: Past Medical History:  Diagnosis Date   Asthma    CAD (coronary artery disease)    s/p NSTEMI 9/19. Cath in [place], [place] 10/19. Severe CAD not amenable to PCI. Not CABG candidate   CHF (congestive heart failure) (HCC)    EF 40-45% by echo due to iCM. EF 36% by Myoview 9/19   Dementia (HCC)    Essential tremor    Hyperlipidemia    Intolerant of statins   Hypertension    PAF (paroxysmal atrial fibrillation) ([place])    Pancytopenia ([place])    Follows with hematology   Prostate cancer ([place])    Seizure ([place])     Medications:  Medications Prior to Admission  Medication Sig Dispense Refill Last Dose   albuterol (VENTOLIN HFA) 108 (90 Base) MCG/ACT inhaler Inhale 1-2 puffs into the lungs every 4 (four) hours as needed for wheezing or shortness of breath.       amiodarone (PACERONE) 200 MG tablet Take 1/2 (one-half) tablet by mouth once daily 45 tablet 2    aspirin EC 81 MG tablet Take 1 tablet (81 mg total) by mouth daily with breakfast. 30 tablet 1    COVID-19 mRNA Vac-TriS, Pfizer, (PFIZER-BIONT COVID-19 VAC-TRIS) SUSP injection Inject into the muscle. 0.3 mL 0    ipratropium (ATROVENT) 0.03 % nasal spray Place 2 sprays into both nostrils every 12 (twelve) hours. 30 mL 12    primidone (MYSOLINE) 50  MG tablet Take 1 tablet (50 mg total) by mouth in the morning and at bedtime. 180 tablet 1    rosuvastatin (CRESTOR) 20 MG tablet Take 1 tablet (20 mg total) by mouth every evening. 30 tablet 1    sodium chloride (OCEAN) 0.65 % SOLN nasal spray Place 1 spray into both nostrils as needed for congestion. 44 mL 0    SYMBICORT 80-4.5 MCG/ACT inhaler Inhale 2 puffs by mouth twice daily 10.2 g 0    tamsulosin (FLOMAX) 0.4 MG CAPS capsule Take 0.4 mg by mouth daily after supper.       warfarin (COUMADIN) 2.5 MG tablet Take 0.5-1 tablet daily or as prescribed by [facility] 90 tablet 1    Scheduled:   amiodarone  100 mg Oral Daily   aspirin EC  81 mg Oral Q breakfast   ipratropium-albuterol  3 mL Nebulization Once   predniSONE  40 mg Oral Q breakfast   rosuvastatin  20 mg Oral QPM   sodium chloride flush  3 mL Intravenous Q12H   tamsulosin  0.4 mg Oral QPC supper   Infusions:   cefTRIAXone (ROCEPHIN)  IV      Assessment: 88yo male c/o SOB, found to have mildly elevated troponin, admitted for  further w/u, to continue Coumadin for AVR and PAF; current INR at goal, was seen yesterday at [facility] clinic.  Goal of Therapy:  INR 2.5-3.5   Plan:  Continue home Coumadin regimen of 2.'5mg'$  TWTSS and 1.'25mg'$  MF. Monitor INR.  Wynona Neat, PharmD, BCPS  08/20/2021,5:33 AM

## 2021-08-20 NOTE — ED Notes (Signed)
ED TO INPATIENT HANDOFF REPORT  ED Nurse Name and Phone #:  April Holding [phone]  S Name/Age/Gender Tanner Ferguson 88 y.o. male Room/Bed: MH09/MH09  Code Status   Code Status: Prior  Home/SNF/Other Home Patient oriented to: self and place Is this baseline? Yes   Triage Complete: Triage complete  Chief Complaint Cardiac enzymes elevated [R74.8]  Triage Note Pt reports SHOB since yesterday; denies pain; son sts he had a fall yesterday   Allergies Allergies  Allergen Reactions   Penicillins Rash    Level of Care/Admitting Diagnosis ED Disposition     ED Disposition  Admit   Condition  --   [place]: [place] [100100]  Level of Care: Telemetry Cardiac [103]  Interfacility transfer: Yes  May place patient in observation at [facility] or [place] if equivalent level of care is available:: No  Covid Evaluation: Asymptomatic - no recent exposure (last 10 days) testing not required  Diagnosis: Cardiac enzymes elevated [290600]  Admitting Physician: Bridgett Larsson, [place]  Attending Physician: Bridgett Larsson, [place]          B Medical/Surgery History Past Medical History:  Diagnosis Date   Asthma    CAD (coronary artery disease)    s/p NSTEMI 9/19. Cath in [place], [place] 10/19. Severe CAD not amenable to PCI. Not CABG candidate   CHF (congestive heart failure) (HCC)    EF 40-45% by echo due to iCM. EF 36% by Myoview 9/19   Dementia (HCC)    Essential tremor    Hyperlipidemia    Intolerant of statins   Hypertension    PAF (paroxysmal atrial fibrillation) ([place])    Pancytopenia ([place])    Follows with hematology   Prostate cancer ([place])    Seizure ([place])    Past Surgical History:  Procedure Laterality Date   AORTIC VALVE REPLACEMENT     CARDIOVERSION     CATARACT EXTRACTION Bilateral    HERNIA REPAIR       A IV Location/Drains/Wounds Patient Lines/Drains/Airways Status     Active Line/Drains/Airways     Name Placement  date Placement time Site Days   Peripheral IV 08/19/21 20 G 1" Left Antecubital 08/19/21  2054  Antecubital  1   External Urinary Catheter 08/20/21  0045  --  less than 1            Intake/Output Last 24 hours No intake or output data in the 24 hours ending 08/20/21 0107  Labs/Imaging Results for orders placed or performed during the hospital encounter of 08/19/21 (from the past 69 hour(s))  Protime-INR (order if Patient is taking Coumadin / Warfarin)     Status: Abnormal   Collection Time: 08/19/21  8:52 PM  Result Value Ref Range   Prothrombin Time 28.2 (H) 11.4 - 15.2 seconds   INR 2.7 (H) 0.8 - 1.2    Comment: (NOTE) INR goal varies based on device and disease states. Performed at [facility], [address] [place], [place] [address]   CBC with Differential     Status: Abnormal   Collection Time: 08/19/21  8:52 PM  Result Value Ref Range   WBC 4.3 4.0 - 10.5 K/uL   RBC 3.81 (L) 4.22 - 5.81 MIL/uL   Hemoglobin 11.3 (L) 13.0 - 17.0 g/dL   HCT 34.4 (L) 39.0 - 52.0 %   MCV 90.3 80.0 - 100.0 fL   MCH 29.7 26.0 - 34.0 pg   MCHC 32.8 30.0 -  36.0 g/dL   RDW 15.2 11.5 - 15.5 %   Platelets 110 (L) 150 - 400 K/uL    Comment: Immature Platelet Fraction may be clinically indicated, consider ordering this additional test [id number]    nRBC 0.0 0.0 - 0.2 %   Neutrophils Relative % 71 %   Neutro Abs 3.1 1.7 - 7.7 K/uL   Lymphocytes Relative 16 %   Lymphs Abs 0.7 0.7 - 4.0 K/uL   Monocytes Relative 11 %   Monocytes Absolute 0.5 0.1 - 1.0 K/uL   Eosinophils Relative 1 %   Eosinophils Absolute 0.0 0.0 - 0.5 K/uL   Basophils Relative 0 %   Basophils Absolute 0.0 0.0 - 0.1 K/uL   WBC Morphology MORPHOLOGY UNREMARKABLE    Smear Review PLATELETS APPEAR ADEQUATE     Comment: PLATELET COUNT CONFIRMED BY SMEAR   Immature Granulocytes 1 %   Abs Immature Granulocytes 0.02 0.00 - 0.07 K/uL   Ovalocytes PRESENT    Giant PLTs PRESENT     Comment: Performed at [facility], [address] [place], [place] [address]  Comprehensive metabolic panel     Status: Abnormal   Collection Time: 08/19/21  8:52 PM  Result Value Ref Range   Sodium 130 (L) 135 - 145 mmol/L   Potassium 4.2 3.5 - 5.1 mmol/L   Chloride 101 98 - 111 mmol/L   CO2 23 22 - 32 mmol/L   Glucose, Bld 112 (H) 70 - 99 mg/dL    Comment: Glucose reference range applies only to samples taken after fasting for at least 8 hours.   BUN 26 (H) 8 - 23 mg/dL   Creatinine, Ser 1.71 (H) 0.61 - 1.24 mg/dL   Calcium 8.7 (L) 8.9 - 10.3 mg/dL   Total Protein 8.2 (H) 6.5 - 8.1 g/dL   Albumin 3.8 3.5 - 5.0 g/dL   AST 26 15 - 41 U/L   ALT 16 0 - 44 U/L   Alkaline Phosphatase 51 38 - 126 U/L   Total Bilirubin 0.9 0.3 - 1.2 mg/dL   GFR, Estimated 37 (L) >60 mL/min    Comment: (NOTE) Calculated using the CKD-EPI Creatinine Equation (2021)    Anion gap 6 5 - 15    Comment: Performed at [facility], [address] [place], [place] [address]  Brain natriuretic peptide     Status: Abnormal   Collection Time: 08/19/21  8:52 PM  Result Value Ref Range   B Natriuretic Peptide 243.2 (H) 0.0 - 100.0 pg/mL    Comment: Performed at [facility], [address] [place], [place] [address]  Troponin I (High Sensitivity)     Status: Abnormal   Collection Time: 08/19/21  8:52 PM  Result Value Ref Range   Troponin I (High Sensitivity) 41 (H) <18 ng/L    Comment: (NOTE) Elevated high sensitivity troponin I (hsTnI) values and significant  changes across serial measurements may suggest ACS but many other  chronic and acute conditions are known to elevate hsTnI results.  Refer to the "Links" section for chest pain algorithms and additional  guidance. Performed at [facility], [address] [place], [place] [address]   SARS Coronavirus 2 by RT PCR (hospital order, performed in [facility] hospital lab) *cepheid single result test* Anterior Nasal Swab     Status: None    Collection Time: 08/19/21  9:09 PM   Specimen: Anterior Nasal Swab  Result Value  Ref Range   SARS Coronavirus 2 by RT PCR NEGATIVE NEGATIVE    Comment: (NOTE) SARS-CoV-2 target nucleic acids are NOT DETECTED.  The SARS-CoV-2 RNA is generally detectable in upper and lower respiratory specimens during the acute phase of infection. The lowest concentration of SARS-CoV-2 viral copies this assay can detect is 250 copies / mL. A negative result does not preclude SARS-CoV-2 infection and should not be used as the sole basis for treatment or other patient management decisions.  A negative result may occur with improper specimen collection / handling, submission of specimen other than nasopharyngeal swab, presence of viral mutation(s) within the areas targeted by this assay, and inadequate number of viral copies (<250 copies / mL). A negative result must be combined with clinical observations, patient history, and epidemiological information.  Fact Sheet for Patients:   https://www.patel.info/  Fact Sheet for Healthcare Providers: https://hall.com/  This test is not yet approved or  cleared by the [place] FDA and has been authorized for detection and/or diagnosis of SARS-CoV-2 by FDA under an Emergency Use Authorization (EUA).  This EUA will remain in effect (meaning this test can be used) for the duration of the COVID-19 declaration under Section 564(b)(1) of the Act, 21 U.S.C. section 360bbb-3(b)(1), unless the authorization is terminated or revoked sooner.  Performed at [facility], [address] [place], [place] [address]   Lactic acid, plasma     Status: None   Collection Time: 08/19/21  9:33 PM  Result Value Ref Range   Lactic Acid, Venous 1.2 0.5 - 1.9 mmol/L    Comment: Performed at [facility], [address] [place], [place] [address]  Troponin I (High Sensitivity)     Status: Abnormal   Collection  Time: 08/19/21 10:56 PM  Result Value Ref Range   Troponin I (High Sensitivity) 49 (H) <18 ng/L    Comment: (NOTE) Elevated high sensitivity troponin I (hsTnI) values and significant  changes across serial measurements may suggest ACS but many other  chronic and acute conditions are known to elevate hsTnI results.  Refer to the "Links" section for chest pain algorithms and additional  guidance. Performed at [facility], [address] [place], [place] [address]    CT Chest Wo Contrast  Result Date: 08/19/2021 CLINICAL DATA:  Shortness of breath. EXAM: CT CHEST WITHOUT CONTRAST TECHNIQUE: Multidetector CT imaging of the chest was performed following the standard protocol without IV contrast. RADIATION DOSE REDUCTION: This exam was performed according to the departmental dose-optimization program which includes automated exposure control, adjustment of the mA and/or kV according to patient size and/or use of iterative reconstruction technique. COMPARISON:  June 13, 2020 FINDINGS: Cardiovascular: There is marked severity calcification of the thoracic aorta. The ascending thoracic aorta measures approximately 4.2 cm in diameter. Normal heart size with marked severity coronary artery calcification. No pericardial effusion. Mediastinum/Nodes: No enlarged mediastinal or axillary lymph nodes. Thyroid gland, trachea, and esophagus demonstrate no significant findings. Lungs/Pleura: Mild posterior left upper lobe and moderate severity left basilar linear scarring and/or atelectasis is seen. Mild atelectatic changes are also seen along the periphery of the right middle lobe and right lung base. Pleural based calcification is seen along the anterolateral aspect of the left upper lobe and bilateral lung bases. There is no evidence of a pleural effusion or pneumothorax. Upper Abdomen: No acute abnormality. Musculoskeletal: Multiple sternal wires are present. Other: Posterior beam hardening artifact is  seen secondary to positioning  of the patient's upper extremities. Multilevel degenerative changes seen throughout the thoracic spine. IMPRESSION: 1. Prior median sternotomy and aortic valve replacement. 2. Mild posterior left upper lobe and moderate severity left basilar linear scarring and/or atelectasis. 3. Mild right middle lobe and right lower lobe atelectatic changes. 4. Bilateral pleural based calcifications which may represent sequelae associated with prior asbestos exposure. 5. Aortic atherosclerosis and marked severity coronary artery calcification. Aortic Atherosclerosis (ICD10-I70.0). Electronically Signed   By: Virgina Norfolk M.D.   On: 08/19/2021 23:23   CT Head Wo Contrast  Result Date: 08/19/2021 CLINICAL DATA:  Status post fall. EXAM: CT HEAD WITHOUT CONTRAST TECHNIQUE: Contiguous axial images were obtained from the base of the skull through the vertex without intravenous contrast. RADIATION DOSE REDUCTION: This exam was performed according to the departmental dose-optimization program which includes automated exposure control, adjustment of the mA and/or kV according to patient size and/or use of iterative reconstruction technique. COMPARISON:  None Available. FINDINGS: Brain: There is mild cerebral atrophy with widening of the extra-axial spaces and ventricular dilatation. There are areas of decreased attenuation within the white matter tracts of the supratentorial brain, consistent with microvascular disease changes. Vascular: No hyperdense vessel or unexpected calcification. Skull: Normal. Negative for fracture or focal lesion. Sinuses/Orbits: There is mild left maxillary sinus mucosal thickening. Marked severity bilateral ethmoid sinus mucosal thickening is also seen. Other: None. IMPRESSION: 1. No acute intracranial abnormality. 2. Generalized cerebral atrophy and microvascular disease changes of the supratentorial brain. 3. Marked severity bilateral ethmoid sinus and mild left maxillary  sinus disease. Electronically Signed   By: Virgina Norfolk M.D.   On: 08/19/2021 21:33   DG Chest 2 View  Result Date: 08/19/2021 CLINICAL DATA:  Shortness of breath for 2 days EXAM: CHEST - 2 VIEW COMPARISON:  04/27/2021 FINDINGS: Cardiac shadow is enlarged but stable. Postsurgical changes are seen. Aortic calcifications are noted. Lungs are well aerated bilaterally. Stable blunting of the left costophrenic angle is noted. No acute infiltrate or sizable effusion is seen. No acute bony abnormality is noted. IMPRESSION: No acute abnormality Electronically Signed   By: Inez Catalina M.D.   On: 08/19/2021 21:23    Pending Labs Unresulted Labs (From admission, onward)     Start     Ordered   08/19/21 2109  Lactic acid, plasma  Now then every 2 hours,   R      08/19/21 2109   08/19/21 2109  Blood culture (routine x 2)  BLOOD CULTURE X 2,   STAT      08/19/21 2109            Vitals/Pain Today's Vitals   08/19/21 2245 08/19/21 2311 08/19/21 2330 08/20/21 0000  BP:  (!) 151/71 (!) 175/65 (!) 162/84  Pulse: 64 (!) 59 61 64  Resp: (!) 24 18 (!) 22 (!) 24  Temp:      TempSrc:      SpO2: 97% 95% 95% 97%  Weight:      Height:      PainSc:        Isolation Precautions No active isolations  Medications Medications  cefTRIAXone (ROCEPHIN) 1 g in sodium chloride 0.9 % 100 mL IVPB (0 g Intravenous Stopped 08/19/21 2339)  azithromycin (ZITHROMAX) 500 mg in sodium chloride 0.9 % 250 mL IVPB (0 mg Intravenous Stopped 08/20/21 0054)    Mobility non-ambulatory High fall risk   Focused Assessments Cardiac Assessment Handoff:  Cardiac Rhythm: Normal sinus rhythm No results found for: "CKTOTAL", "CKMB", "  CKMBINDEX", "TROPONINI" No results found for: "DDIMER" Does the Patient currently have chest pain? No    R Recommendations: See Admitting Provider Note  Report given to:   Additional Notes: Pt has alzheimer's and parkinson's, family (son) plans on staying with him. EDP originally  suspected Pneumonia and started ABX, they have been discontinued.

## 2021-08-20 NOTE — Progress Notes (Addendum)
Brief same day note:  Patient is a 88 year old male with history of coronary artery disease, asthma, ischemic cardiomyopathy, A-fib, mechanical aortic valve, dementia, CKD stage IIIb, chronic hyponatremia, chronic anemia/thrombocytopenia who presented from home with complaints of cough, wheezing, fatigue, nasal congestion.  He was significantly weak and had a fall on 6/20.  Also complaining of pain on the left hip.  On presentation, he was afebrile, saturating mid 90s on room air, blood pressure stable.  Chest x-rays did not show any acute cardiopulmonary disease.  CT head was negative for acute findings.  Lab work showed creatinine of 1.17. BNP was mildly elevated.  Patient was admitted for further management.   Brief assessment and plan:  Acute asthma exacerbation/COPD ,acute sinusitis/history of asbestos exposure: Presented with fatigue, sinus congestion, , cough, wheezing.  Started on prednisone, bronchodilators.  Currently on room air.  Also started on ceftriaxone . Found to have bilateral crackles.  Chest CT showed linear scarring and/or atelectasis, Mild right middle lobe and right lower lobe atelectatic changes,bilateral pleural based calcifications which may represent sequelae associated with prior asbestos exposure.  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 Though patient has mild elevated BNP, will like to avoid diuretics at this point.  Crackles are secondary to above changes. Patient does not have a documented history of asthma or COPD.  History of coronary artery disease/elevated troponin: Denies any chest pain.  No anginal symptoms.  Low suspicion for ACS.  Continue to monitor  Paroxysmal A-fib/mechanical aortic valve: Currently on amiodarone, warfarin.  Monitor INR.  Rate is well controlled  CKD stage IIIb: Baseline creatinine around 1.7.  Continue kidney function at baseline  Chronic hyponatremia: Similar to prior, in the range of 130.  Chronic  normocytic anemia/thrombocytopenia: Hemoglobin ,platelets stable  OSA: Continue CPAP at night  Weakness/fall: Consult PT.  Discussed with son at the bedside about our plan.  Patient was previously DNR but son wants to change him to inpatient

## 2021-08-20 NOTE — Evaluation (Addendum)
Physical Therapy Evaluation Patient Details Name: Tanner Ferguson MRN: [id number] DOB: 12-12-1927 Today's Date: 08/20/2021  History of Present Illness  Pt is a 88 y.o. male admitted 08/19/21 with cough, wheezing, fatigue, weakness (fall on 6/20). Chest CT showed bilateral lobe atelectatic changes, bilateral pleural based calcifications which may represent sequelae associated with prior asbestos exposure. Head CT negative for acute injury. Workup for acute asthma/COPD exacerbation, sinusitis; pt with known h/o asbestos exposure. Other PMH includes CAD, asthma, ischemic cardiomyopathy, afib, CKD 3, dementia.   Clinical Impression  Pt presents with an overall decrease in functional mobility secondary to above. PTA, pt ambulatory without DME, son assists with stairs and w/c mobility for community distances; pt lives with family who assists with ADL/iADLs. Today, pt tolerating brief bouts of standing activity with RW, requiring minA for mobility and up to maxA for ADL tasks. Pt's son present and supportive; discussed safe return home with family assist, but son interested in SNF-level therapies to maximize functional mobility and independence since pt significantly below baseline PLOF. Will follow acutely to address established goals.    SpO2 97% on RA, HR 60s, BP 138/80    Recommendations for follow up therapy are one component of a multi-disciplinary discharge planning process, led by the attending physician.  Recommendations may be updated based on patient status, additional functional criteria and insurance authorization.  Follow Up Recommendations Skilled nursing-short term rehab (<3 hours/day) Can patient physically be transported by private vehicle: Yes    Assistance Recommended at Discharge Frequent or constant Supervision/Assistance  Patient can return home with the following  A little help with walking and/or transfers;A lot of help with bathing/dressing/bathroom;Assistance with  cooking/housework;Direct supervision/assist for medications management;Direct supervision/assist for financial management;Assist for transportation;Help with stairs or ramp for entrance    Equipment Recommendations None recommended by PT  Recommendations for Other Services   Occupational Therapy   Functional Status Assessment Patient has had a recent decline in their functional status and demonstrates the ability to make significant improvements in function in a reasonable and predictable amount of time.     Precautions / Restrictions Precautions Precautions: Fall;Other (comment) Precaution Comments: urine incontinence; tremulous (h/o Parkinson's?) Restrictions Weight Bearing Restrictions: No      Mobility  Bed Mobility Overal bed mobility: Needs Assistance Bed Mobility: Supine to Sit     Supine to sit: Min assist, HOB elevated     General bed mobility comments: good initiation of BLE movement to EOB, cues for use of bed rail, minA for trunk elevation; increased time and effort with cues to scoot hips forward    Transfers Overall transfer level: Needs assistance Equipment used: Rolling walker (2 wheels) Transfers: Sit to/from Stand, Bed to chair/wheelchair/BSC Sit to Stand: Min assist   Step pivot transfers: Min assist       General transfer comment: MinA for trunk elevation standing from EOB and recliner to RW, improved ability to stand with recliner armrests; pivotal steps to recliner with RW and minA for stability and RW management as pt with significant BUE tremors    Ambulation/Gait                  Stairs            Wheelchair Mobility    Modified Rankin (Stroke Patients Only)       Balance Overall balance assessment: Needs assistance   Sitting balance-Leahy Scale: Fair Sitting balance - Comments: able to wash upper legs sitting in recliner, requires assist to wash feet and  don socks   Standing balance support: Bilateral upper extremity  supported, During functional activity, Reliant on assistive device for balance Standing balance-Leahy Scale: Poor Standing balance comment: requires assist for posterior washup while standing, reliant on BUE support                             Pertinent Vitals/Pain Pain Assessment Pain Assessment: No/denies pain    Home Living Family/patient expects to be discharged to:: Private residence Living Arrangements: Children Available Help at Discharge: Family Type of Home: House Home Access: Stairs to enter   Technical brewer of Steps: 2 Alternate Level Stairs-Number of Steps: Flight (w/ landing) Home Layout: Two level Home Equipment: Conservation officer, nature (2 wheels);Wheelchair - Sport and exercise psychologist Comments: If family gone - pt just has to say "Hey Alexa" for son (emergency contact) to be contacted    Prior Function Prior Level of Function : Needs assist             Mobility Comments: typically ambulatory without DME; son assists pt on stairs; pushed in w/c by family for longer distances (i.e. to doctor appt); does not drive. enjoys watching TV ADLs Comments: Since admission for PNA in 04/2021, son assists with bathing and pericare after BM, pt indep to urinate. Family assists with all iADLs     Hand Dominance        Extremity/Trunk Assessment   Upper Extremity Assessment Upper Extremity Assessment: Generalized weakness (tremulous BUEs)    Lower Extremity Assessment Lower Extremity Assessment: Generalized weakness       Communication   Communication: HOH  Cognition Arousal/Alertness: Awake/alert Behavior During Therapy: WFL for tasks assessed/performed, Flat affect Overall Cognitive Status: History of cognitive impairments - at baseline                                 General Comments: h/o dementia; pt answering majority of questions appropriately (son correcting some PLOF details), following simple commands well with basic cues;  requires repetition due to [place] comments (skin integrity, edema, etc.): noted primofit malfunction, pt saturated with urine, requires assist for ADL tasks (washup, pericare, changing gowns). pt's son present and supportive. SpO2 97% on RA, HR 60s, BP 138/80    Exercises     Assessment/Plan    PT Assessment Patient needs continued PT services  PT Problem List Decreased strength;Decreased activity tolerance;Decreased balance;Decreased mobility;Decreased cognition;Decreased knowledge of use of DME;Cardiopulmonary status limiting activity       PT Treatment Interventions DME instruction;Gait training;Stair training;Functional mobility training;Therapeutic activities;Therapeutic exercise;Balance training;Patient/family education;Wheelchair mobility training    PT Goals (Current goals can be found in the Care Plan section)  Acute Rehab PT Goals Patient Stated Goal: Hopeful to regain strength enough for return home PT Goal Formulation: With family Time For Goal Achievement: 09/03/21 Potential to Achieve Goals: Good    Frequency Min 3X/week     Co-evaluation               AM-PAC PT "6 Clicks" Mobility  Outcome Measure Help needed turning from your back to your side while in a flat bed without using bedrails?: A Little Help needed moving from lying on your back to sitting on the side of a flat bed without using bedrails?: A Little Help needed moving to and from a bed to a chair (including a wheelchair)?:  A Little Help needed standing up from a chair using your arms (e.g., wheelchair or bedside chair)?: A Little Help needed to walk in hospital room?: Total Help needed climbing 3-5 steps with a railing? : Total 6 Click Score: 14    End of Session Equipment Utilized During Treatment: Gait belt Activity Tolerance: Patient tolerated treatment well;Patient limited by fatigue Patient left: in chair;with call bell/phone within reach;with chair alarm  set;with family/visitor present Nurse Communication: Mobility status PT Visit Diagnosis: Other abnormalities of gait and mobility (R26.89);Muscle weakness (generalized) (M62.81)    Time: 6967-8938 PT Time Calculation (min) (ACUTE ONLY): 31 min   Charges:   PT Evaluation $PT Eval Moderate Complexity: 1 Mod PT Treatments $Therapeutic Activity: 8-22 mins      Mabeline Caras, PT, DPT Acute Rehabilitation Services  Pager [phone] Office [phone]  Derry Lory 08/20/2021, 2:34 PM

## 2021-08-20 NOTE — ED Provider Notes (Signed)
Nursing notes and vitals signs, including pulse oximetry, reviewed.  Summary of this visit's results, reviewed by myself:  EKG:  EKG Interpretation  Date/Time:  Wednesday August 19 2021 20:46:08 EDT Ventricular Rate:  65 PR Interval:    QRS Duration: 116 QT Interval:  373 QTC Calculation: 388 R Axis:   -33 Text Interpretation: sinus rhythm Left ventricular hypertrophy when compared to prior, similar appearance. No STEMI Confirmed by Antony Blackbird [phone]) on 08/19/2021 8:49:06 PM        Labs:  Results for orders placed or performed during the hospital encounter of 08/19/21 (from the past 24 hour(s))  Protime-INR (order if Patient is taking Coumadin / Warfarin)     Status: Abnormal   Collection Time: 08/19/21  8:52 PM  Result Value Ref Range   Prothrombin Time 28.2 (H) 11.4 - 15.2 seconds   INR 2.7 (H) 0.8 - 1.2  CBC with Differential     Status: Abnormal   Collection Time: 08/19/21  8:52 PM  Result Value Ref Range   WBC 4.3 4.0 - 10.5 K/uL   RBC 3.81 (L) 4.22 - 5.81 MIL/uL   Hemoglobin 11.3 (L) 13.0 - 17.0 g/dL   HCT 34.4 (L) 39.0 - 52.0 %   MCV 90.3 80.0 - 100.0 fL   MCH 29.7 26.0 - 34.0 pg   MCHC 32.8 30.0 - 36.0 g/dL   RDW 15.2 11.5 - 15.5 %   Platelets 110 (L) 150 - 400 K/uL   nRBC 0.0 0.0 - 0.2 %   Neutrophils Relative % 71 %   Neutro Abs 3.1 1.7 - 7.7 K/uL   Lymphocytes Relative 16 %   Lymphs Abs 0.7 0.7 - 4.0 K/uL   Monocytes Relative 11 %   Monocytes Absolute 0.5 0.1 - 1.0 K/uL   Eosinophils Relative 1 %   Eosinophils Absolute 0.0 0.0 - 0.5 K/uL   Basophils Relative 0 %   Basophils Absolute 0.0 0.0 - 0.1 K/uL   WBC Morphology MORPHOLOGY UNREMARKABLE    Smear Review PLATELETS APPEAR ADEQUATE    Immature Granulocytes 1 %   Abs Immature Granulocytes 0.02 0.00 - 0.07 K/uL   Ovalocytes PRESENT    Giant PLTs PRESENT   Comprehensive metabolic panel     Status: Abnormal   Collection Time: 08/19/21  8:52 PM  Result Value Ref Range   Sodium 130 (L) 135 - 145  mmol/L   Potassium 4.2 3.5 - 5.1 mmol/L   Chloride 101 98 - 111 mmol/L   CO2 23 22 - 32 mmol/L   Glucose, Bld 112 (H) 70 - 99 mg/dL   BUN 26 (H) 8 - 23 mg/dL   Creatinine, Ser 1.71 (H) 0.61 - 1.24 mg/dL   Calcium 8.7 (L) 8.9 - 10.3 mg/dL   Total Protein 8.2 (H) 6.5 - 8.1 g/dL   Albumin 3.8 3.5 - 5.0 g/dL   AST 26 15 - 41 U/L   ALT 16 0 - 44 U/L   Alkaline Phosphatase 51 38 - 126 U/L   Total Bilirubin 0.9 0.3 - 1.2 mg/dL   GFR, Estimated 37 (L) >60 mL/min   Anion gap 6 5 - 15  Brain natriuretic peptide     Status: Abnormal   Collection Time: 08/19/21  8:52 PM  Result Value Ref Range   B Natriuretic Peptide 243.2 (H) 0.0 - 100.0 pg/mL  Troponin I (High Sensitivity)     Status: Abnormal   Collection Time: 08/19/21  8:52 PM  Result Value Ref Range  Troponin I (High Sensitivity) 41 (H) <18 ng/L  SARS Coronavirus 2 by RT PCR (hospital order, performed in [facility] hospital lab) *cepheid single result test* Anterior Nasal Swab     Status: None   Collection Time: 08/19/21  9:09 PM   Specimen: Anterior Nasal Swab  Result Value Ref Range   SARS Coronavirus 2 by RT PCR NEGATIVE NEGATIVE  Lactic acid, plasma     Status: None   Collection Time: 08/19/21  9:33 PM  Result Value Ref Range   Lactic Acid, Venous 1.2 0.5 - 1.9 mmol/L  Troponin I (High Sensitivity)     Status: Abnormal   Collection Time: 08/19/21 10:56 PM  Result Value Ref Range   Troponin I (High Sensitivity) 49 (H) <18 ng/L    Imaging Studies: CT Chest Wo Contrast  Result Date: 08/19/2021 CLINICAL DATA:  Shortness of breath. EXAM: CT CHEST WITHOUT CONTRAST TECHNIQUE: Multidetector CT imaging of the chest was performed following the standard protocol without IV contrast. RADIATION DOSE REDUCTION: This exam was performed according to the departmental dose-optimization program which includes automated exposure control, adjustment of the mA and/or kV according to patient size and/or use of iterative reconstruction technique.  COMPARISON:  June 13, 2020 FINDINGS: Cardiovascular: There is marked severity calcification of the thoracic aorta. The ascending thoracic aorta measures approximately 4.2 cm in diameter. Normal heart size with marked severity coronary artery calcification. No pericardial effusion. Mediastinum/Nodes: No enlarged mediastinal or axillary lymph nodes. Thyroid gland, trachea, and esophagus demonstrate no significant findings. Lungs/Pleura: Mild posterior left upper lobe and moderate severity left basilar linear scarring and/or atelectasis is seen. Mild atelectatic changes are also seen along the periphery of the right middle lobe and right lung base. Pleural based calcification is seen along the anterolateral aspect of the left upper lobe and bilateral lung bases. There is no evidence of a pleural effusion or pneumothorax. Upper Abdomen: No acute abnormality. Musculoskeletal: Multiple sternal wires are present. Other: Posterior beam hardening artifact is seen secondary to positioning of the patient's upper extremities. Multilevel degenerative changes seen throughout the thoracic spine. IMPRESSION: 1. Prior median sternotomy and aortic valve replacement. 2. Mild posterior left upper lobe and moderate severity left basilar linear scarring and/or atelectasis. 3. Mild right middle lobe and right lower lobe atelectatic changes. 4. Bilateral pleural based calcifications which may represent sequelae associated with prior asbestos exposure. 5. Aortic atherosclerosis and marked severity coronary artery calcification. Aortic Atherosclerosis (ICD10-I70.0). Electronically Signed   By: Virgina Norfolk M.D.   On: 08/19/2021 23:23   CT Head Wo Contrast  Result Date: 08/19/2021 CLINICAL DATA:  Status post fall. EXAM: CT HEAD WITHOUT CONTRAST TECHNIQUE: Contiguous axial images were obtained from the base of the skull through the vertex without intravenous contrast. RADIATION DOSE REDUCTION: This exam was performed according to the  departmental dose-optimization program which includes automated exposure control, adjustment of the mA and/or kV according to patient size and/or use of iterative reconstruction technique. COMPARISON:  None Available. FINDINGS: Brain: There is mild cerebral atrophy with widening of the extra-axial spaces and ventricular dilatation. There are areas of decreased attenuation within the white matter tracts of the supratentorial brain, consistent with microvascular disease changes. Vascular: No hyperdense vessel or unexpected calcification. Skull: Normal. Negative for fracture or focal lesion. Sinuses/Orbits: There is mild left maxillary sinus mucosal thickening. Marked severity bilateral ethmoid sinus mucosal thickening is also seen. Other: None. IMPRESSION: 1. No acute intracranial abnormality. 2. Generalized cerebral atrophy and microvascular disease changes of the supratentorial  brain. 3. Marked severity bilateral ethmoid sinus and mild left maxillary sinus disease. Electronically Signed   By: Virgina Norfolk M.D.   On: 08/19/2021 21:33   DG Chest 2 View  Result Date: 08/19/2021 CLINICAL DATA:  Shortness of breath for 2 days EXAM: CHEST - 2 VIEW COMPARISON:  04/27/2021 FINDINGS: Cardiac shadow is enlarged but stable. Postsurgical changes are seen. Aortic calcifications are noted. Lungs are well aerated bilaterally. Stable blunting of the left costophrenic angle is noted. No acute infiltrate or sizable effusion is seen. No acute bony abnormality is noted. IMPRESSION: No acute abnormality Electronically Signed   By: Inez Catalina M.D.   On: 08/19/2021 21:23    12:35 AM Discussed with Dr. Bridgett Larsson of the hospitalist service.  He will admit the patient for elevated troponin.  No evidence of pneumonia on chest CT.  He requests no more antibiotics or IV fluids.   Jayni Prescher, Jenny Reichmann, MD 08/20/21 [phone]

## 2021-08-20 NOTE — Progress Notes (Signed)
Patients son states he does not want patient to be a DNR. Adhikari MD made aware by myself and son.  Daymon Larsen, RN

## 2021-08-20 NOTE — Patient Instructions (Signed)
Description   6/21 at 457p-left message to call back.  6/22- pt hospitalized.  Continue taking warfarin 1 tablet daily except for 1/2 a tablet on Monday and Friday. Recheck INR in 1 week.

## 2021-08-20 NOTE — TOC Initial Note (Signed)
Transition of Care [facility]) - Initial/Assessment Note    Patient Details  Name: Tanner Ferguson MRN: [id number] Date of Birth: 08/14/1927  [facility] CM/SW Contact:    Vinie Sill, LCSW Phone Number: 08/20/2021, 4:06 PM  Clinical Narrative:                  CSW spoke with patient's son-CSW introduced self and explained role. CSW discussed therapy recommendations of short term rehab at [facility]. Family expressed not certain at this time about SNF placement. Family hopes patient will  progress to a level where he is able to return home. However, he would like to further discuss recommendations for SNF with his spouse prior to making decision. CSW explained SNF process. No questions or concerns at this time.   TOC will follow and assist with discharge planning.  Thurmond Butts, MSW, LCSW Clinical Social Worker     Barriers to Discharge: Continued Medical Work up   Patient Goals and CMS Choice        Expected Discharge Plan and Services   In-house Referral: Clinical Social Work                                            Prior Living Arrangements/Services   Lives with:: Self, Adult Children          Need for Family Participation in Patient Care: No (Comment) Care giver support system in place?: No (comment)      Activities of Daily Living Home Assistive Devices/Equipment: Shower chair with back, Grab bars in shower, Eyeglasses, Dentures (specify type), CPAP ADL Screening (condition at time of admission) Patient's cognitive ability adequate to safely complete daily activities?: No Is the patient deaf or have difficulty hearing?: Yes Does the patient have difficulty seeing, even when wearing glasses/contacts?: Yes Does the patient have difficulty concentrating, remembering, or making decisions?: Yes Patient able to express need for assistance with ADLs?: Yes Does the patient have difficulty dressing or bathing?: Yes Independently performs ADLs?:  No Communication: Independent Dressing (OT): Needs assistance Is this a change from baseline?: Pre-admission baseline Grooming: Needs assistance, Dependent Is this a change from baseline?: Pre-admission baseline Feeding: Needs assistance, Independent with device (comment) Is this a change from baseline?: Pre-admission baseline Bathing: Needs assistance Is this a change from baseline?: Pre-admission baseline Toileting: Needs assistance Is this a change from baseline?: Pre-admission baseline In/Out Bed: Needs assistance Is this a change from baseline?: Pre-admission baseline Walks in Home: Needs assistance Is this a change from baseline?: Pre-admission baseline Does the patient have difficulty walking or climbing stairs?: Yes Weakness of Legs: Both Weakness of Arms/Hands: Both  Permission Sought/Granted                  Emotional Assessment              Admission diagnosis:  SOB (shortness of breath) [R06.02] Cardiac enzymes elevated [R74.8] Elevated troponin [R77.8] Acute cough [R05.1] Patient Active Problem List   Diagnosis Date Noted   Elevated troponin 08/20/2021   Acute maxillary sinusitis 08/20/2021   Asthma exacerbation    OSA on CPAP    Stage 3b chronic kidney disease (CKD) ([place])    Acute bacterial sinusitis    Thrombocytopenia ([place]) 11/13/2018   Hyponatremia 10/20/2018   Collapse with respiratory arrest on examination ([place]) 10/20/2018   Hyperlipidemia    Essential tremor  Hypertension    Dementia ([place])    Anemia    Cellulitis of finger of right hand 10/19/2018   History of mechanical aortic valve replacement 88/33/7445   Chronic systolic heart failure ([place]) 03/09/2018   Long term (current) use of anticoagulants 03/09/2018   Ischemic cardiomyopathy 03/09/2018   Coronary artery disease    Atrial fibrillation ([place]) 01/25/2018   PCP:  Wenda Low, MD Pharmacy:   [facility], [place] [address] Phone: [phone] Fax: [phone]     Social Determinants of Health (SDOH) Interventions    Readmission Risk Interventions     No data to display

## 2021-08-20 NOTE — Evaluation (Signed)
Clinical/Bedside Swallow Evaluation Patient Details  Name: Tanner Ferguson MRN: [id number] Date of Birth: [birth year]  Today's Date: 08/20/2021 Time: SLP Start Time (ACUTE ONLY): 0910 SLP Stop Time (ACUTE ONLY): 3382 SLP Time Calculation (min) (ACUTE ONLY): 13 min  Past Medical History:  Past Medical History:  Diagnosis Date   Asthma    CAD (coronary artery disease)    s/p NSTEMI 9/19. Cath in [place], [place] 10/19. Severe CAD not amenable to PCI. Not CABG candidate   CHF (congestive heart failure) (HCC)    EF 40-45% by echo due to iCM. EF 36% by Myoview 9/19   Dementia (HCC)    Essential tremor    Hyperlipidemia    Intolerant of statins   Hypertension    PAF (paroxysmal atrial fibrillation) ([place])    Pancytopenia ([place])    Follows with hematology   Prostate cancer ([place])    Seizure ([place])    Past Surgical History:  Past Surgical History:  Procedure Laterality Date   AORTIC VALVE REPLACEMENT     CARDIOVERSION     CATARACT EXTRACTION Bilateral    HERNIA REPAIR     HPI:  Tanner Ferguson is a pleasant 88 y.o. male with medical history significant for CAD, ischemic cardiomyopathy, atrial fibrillation, mechanical aortic valve, dementia, tremor, CKD 3B, chronic hyponatremia, and chronic anemia and thrombocytopenia, presenting to the emergency department for evaluation of cough, wheezing, fatigue, and nasal congestion.  Pt has a baseline lung disease due to asbestos exposure. CT mentions Mild posterior left upper lobe and moderate severity left basilar  linear scarring and/or atelectasis.  Mild right middle lobe and right lower lobe atelectatic changes. Bilateral pleural based calcifications which may represent.    Assessment / Plan / Recommendation  Clinical Impression  Pt demonstrates no observable signs of dysphagia or aspiration. Pt needs assist to hold cup due to tremor and typically drinks from a lidded straw cup at home. Son at bedside denies any observation of pt choking on food or liquids.  He does report that there has has been a question of aspiration as a cause of pna in the past given his risk factors (advanced age and dementia) though there is no more concrete evidence other than dx of pna. SLP reproted pt could be aspirating without observable signs, but he appears to eat and drink well and enjoy food. Asked son if he wished to pursue further instrumental testing and son replied no. Will continue regular diet and thin liquids. No SLP f/u needed at this time will sign off. SLP Visit Diagnosis: Dysphagia, unspecified (R13.10)    Aspiration Risk       Diet Recommendation Regular;Thin liquid   Liquid Administration via: Cup;Straw Medication Administration: Whole meds with liquid Supervision: Staff to assist with self feeding Compensations: Slow rate;Small sips/bites;Minimize environmental distractions Postural Changes: Seated upright at 90 degrees    Other  Recommendations Oral Care Recommendations: Oral care BID    Recommendations for follow up therapy are one component of a multi-disciplinary discharge planning process, led by the attending physician.  Recommendations may be updated based on patient status, additional functional criteria and insurance authorization.  Follow up Recommendations        Assistance Recommended at Discharge    Functional Status Assessment    Frequency and Duration            Prognosis        Swallow Study   General HPI: Tanner Ferguson is a pleasant 88 y.o. male with medical history significant for  CAD, ischemic cardiomyopathy, atrial fibrillation, mechanical aortic valve, dementia, tremor, CKD 3B, chronic hyponatremia, and chronic anemia and thrombocytopenia, presenting to the emergency department for evaluation of cough, wheezing, fatigue, and nasal congestion.  Pt has a baseline lung disease due to asbestos exposure. CT mentions Mild posterior left upper lobe and moderate severity left basilar  linear scarring and/or atelectasis.  Mild  right middle lobe and right lower lobe atelectatic changes. Bilateral pleural based calcifications which may represent. Type of Study: Bedside Swallow Evaluation Previous Swallow Assessment: none Diet Prior to this Study: Regular;Thin liquids Temperature Spikes Noted: No Respiratory Status: Nasal cannula History of Recent Intubation: No Behavior/Cognition: Alert;Cooperative;Pleasant mood Oral Cavity Assessment: Within Functional Limits Oral Care Completed by SLP: No Oral Cavity - Dentition: Dentures, top;Dentures, bottom Vision: Functional for self-feeding Self-Feeding Abilities: Needs assist Patient Positioning: Upright in bed Baseline Vocal Quality: Normal Volitional Cough: Congested Volitional Swallow: Able to elicit    Oral/Motor/Sensory Function Overall Oral Motor/Sensory Function: Within functional limits   Ice Chips     Thin Liquid Thin Liquid: Within functional limits Presentation: Cup;Straw    Nectar Thick Nectar Thick Liquid: Not tested   Honey Thick Honey Thick Liquid: Not tested   Puree Puree: Within functional limits   Solid     Solid: Within functional limits      Tanner Ferguson, Katherene Ponto 08/20/2021,9:42 AM

## 2021-08-20 NOTE — Progress Notes (Signed)
Patient has home unit CPAP at beside. Family helps patient with CPAP they will call if help needed.

## 2021-08-20 NOTE — H&P (Addendum)
History and Physical    Naim Murtha KZS:[id number] DOB: March 10, 1927 DOA: 08/19/2021  PCP: Wenda Low, MD   Patient coming from: Home   Chief Complaint: Fatigue, cough, wheezing, nasal congestion, fall   HPI: Tanner Ferguson is a pleasant 88 y.o. male with medical history significant for CAD, ischemic cardiomyopathy, atrial fibrillation, mechanical aortic valve, dementia, tremor, CKD 3B, chronic hyponatremia, and chronic anemia and thrombocytopenia, presenting to the emergency department for evaluation of cough, wheezing, fatigue, and nasal congestion.  He is accompanied by his son who assists with the history.  Patient's son and daughter-in-law who is a nurse noticed that he developed some sinus congestion and cough 1 to 2 days ago, has grown progressively fatigued and generally weak, and had a minor fall due to this on 08/18/2021.  He was complaining of some pain in his hip and left side but has been ambulatory since then.  He has had audible wheezing and frequent cough and his son and daughter-in-law suspected that he had developed a sinus infection with postnasal drip that was affecting his breathing.  Patient denies any chest pain.  [facility] ED Course: Upon arrival to the ED, patient is found to be afebrile and saturating mid 90s on room air with stable blood pressure.  EKG features sinus rhythm with LVH.  Chest x-ray is negative for acute cardiopulmonary disease.  Head CT negative for acute intracranial abnormality but notable for marked severity sinusitis.  CT chest demonstrates pleural-based calcifications consistent with asbestos exposure and areas of atelectasis.  Chemistry panel features a sodium 130 and creatinine of 1.71.  CBC notable for platelets 110,000 and mild normocytic anemia.  Lactic acid is normal.  Troponin is slightly elevated.  BNP is 243.  Blood cultures were collected in the ED, patient was treated with Rocephin and azithromycin, and he was transferred to [facility] for  admission.  Review of Systems:  All other systems reviewed and apart from HPI, are negative.  Past Medical History:  Diagnosis Date   Asthma    CAD (coronary artery disease)    s/p NSTEMI 9/19. Cath in [place], [place] 10/19. Severe CAD not amenable to PCI. Not CABG candidate   CHF (congestive heart failure) (HCC)    EF 40-45% by echo due to iCM. EF 36% by Myoview 9/19   Dementia (HCC)    Essential tremor    Hyperlipidemia    Intolerant of statins   Hypertension    PAF (paroxysmal atrial fibrillation) ([place])    Pancytopenia ([place])    Follows with hematology   Prostate cancer ([place])    Seizure ([place])     Past Surgical History:  Procedure Laterality Date   AORTIC VALVE REPLACEMENT     CARDIOVERSION     CATARACT EXTRACTION Bilateral    HERNIA REPAIR      Social History:   reports that he has never smoked. He quit smokeless tobacco use about 3 years ago.  His smokeless tobacco use included chew. He reports that he does not currently use alcohol. No history on file for drug use.  Allergies  Allergen Reactions   Penicillins Rash    Family History  Problem Relation Age of Onset   Coronary artery disease Mother    Coronary artery disease Father    Lung cancer Child    Drug abuse Child      Prior to Admission medications   Medication Sig Start Date End Date Taking? Authorizing Provider  albuterol (VENTOLIN HFA) 108 (90 Base) MCG/ACT inhaler  Inhale 1-2 puffs into the lungs every 4 (four) hours as needed for wheezing or shortness of breath.  08/01/18   [provider]  amiodarone (PACERONE) 200 MG tablet Take 1/2 (one-half) tablet by mouth once daily 01/12/21   Buford Dresser, MD  aspirin EC 81 MG tablet Take 1 tablet (81 mg total) by mouth daily with breakfast. 10/21/18   Roxan Hockey, MD  COVID-19 mRNA Vac-TriS, Pfizer, (PFIZER-BIONT COVID-19 VAC-TRIS) SUSP injection Inject into the muscle. 06/13/20   Carlyle Basques, MD  ipratropium (ATROVENT) 0.03 % nasal spray  Place 2 sprays into both nostrils every 12 (twelve) hours. 07/17/19   Darr, Edison Nasuti, PA-C  primidone (MYSOLINE) 50 MG tablet Take 1 tablet (50 mg total) by mouth in the morning and at bedtime. 10/29/19   Tat, Eustace Quail, DO  rosuvastatin (CRESTOR) 20 MG tablet Take 1 tablet (20 mg total) by mouth every evening. 10/21/18 03/23/21  Roxan Hockey, MD  sodium chloride (OCEAN) 0.65 % SOLN nasal spray Place 1 spray into both nostrils as needed for congestion. 07/17/19   Darr, Edison Nasuti, PA-C  SYMBICORT 80-4.5 MCG/ACT inhaler Inhale 2 puffs by mouth twice daily 10/22/19   Mannam, Hart Robinsons, MD  tamsulosin (FLOMAX) 0.4 MG CAPS capsule Take 0.4 mg by mouth daily after supper.     [provider]  warfarin (COUMADIN) 2.5 MG tablet Take 0.5-1 tablet daily or as prescribed by [facility] 04/23/20   Buford Dresser, MD    Physical Exam: Vitals:   08/19/21 2330 08/20/21 0000 08/20/21 0100 08/20/21 0328  BP: (!) 175/65 (!) 162/84 (!) 130/106 (!) 172/86  Pulse: 61 64 63 67  Resp: (!) 22 (!) 24 (!) 22 (!) 22  Temp:    98.2 F (36.8 C)  TempSrc:    Oral  SpO2: 95% 97% 97% 98%  Weight:      Height:        Constitutional: NAD, calm  Eyes: PERTLA, lids and conjunctivae normal ENMT: Mucous membranes are moist. Pain over central face.   Neck: supple, no masses  Respiratory: Diminished breath sounds bilaterally with prolonged expiratory phase and wheezes. No accessory muscle use.  Cardiovascular: S1 & S2 heard, regular rate and rhythm. Trace b/l LE edema. No significant JVD. Abdomen: No distension, no tenderness, soft. Bowel sounds active.  Musculoskeletal: no clubbing / cyanosis. No joint deformity upper and lower extremities.   Skin: No pallor or diaphoresis. Warm, dry, well-perfused. Neurologic: CN 2-12 grossly intact. Moving all extremities. Resting tremor. Alert and oriented to person and place only.  Psychiatric: Pleasant. Cooperative.    Labs and Imaging on Admission: I have personally  reviewed following labs and imaging studies  CBC: Recent Labs  Lab 08/19/21 2052  WBC 4.3  NEUTROABS 3.1  HGB 11.3*  HCT 34.4*  MCV 90.3  PLT 673*   Basic Metabolic Panel: Recent Labs  Lab 08/19/21 2052  NA 130*  K 4.2  CL 101  CO2 23  GLUCOSE 112*  BUN 26*  CREATININE 1.71*  CALCIUM 8.7*   GFR: Estimated Creatinine Clearance: 23.1 mL/min (A) (by C-G formula based on SCr of 1.71 mg/dL (H)). Liver Function Tests: Recent Labs  Lab 08/19/21 2052  AST 26  ALT 16  ALKPHOS 51  BILITOT 0.9  PROT 8.2*  ALBUMIN 3.8   No results for input(s): "LIPASE", "AMYLASE" in the last 168 hours. No results for input(s): "AMMONIA" in the last 168 hours. Coagulation Profile: Recent Labs  Lab 08/17/21 0000 08/19/21 2052  INR 2.6 2.7*  Cardiac Enzymes: No results for input(s): "CKTOTAL", "CKMB", "CKMBINDEX", "TROPONINI" in the last 168 hours. BNP (last 3 results) No results for input(s): "PROBNP" in the last 8760 hours. HbA1C: No results for input(s): "HGBA1C" in the last 72 hours. CBG: No results for input(s): "GLUCAP" in the last 168 hours. Lipid Profile: No results for input(s): "CHOL", "HDL", "LDLCALC", "TRIG", "CHOLHDL", "LDLDIRECT" in the last 72 hours. Thyroid Function Tests: No results for input(s): "TSH", "T4TOTAL", "FREET4", "T3FREE", "THYROIDAB" in the last 72 hours. Anemia Panel: No results for input(s): "VITAMINB12", "FOLATE", "FERRITIN", "TIBC", "IRON", "RETICCTPCT" in the last 72 hours. Urine analysis: No results found for: "COLORURINE", "APPEARANCEUR", "LABSPEC", "PHURINE", "GLUCOSEU", "HGBUR", "BILIRUBINUR", "KETONESUR", "PROTEINUR", "UROBILINOGEN", "NITRITE", "LEUKOCYTESUR" Sepsis Labs: '@LABRCNTIP'$ (procalcitonin:4,lacticidven:4) ) Recent Results (from the past 240 hour(s))  SARS Coronavirus 2 by RT PCR (hospital order, performed in [facility] hospital lab) *cepheid single result test* Anterior Nasal Swab     Status: None   Collection Time: 08/19/21  9:09  PM   Specimen: Anterior Nasal Swab  Result Value Ref Range Status   SARS Coronavirus 2 by RT PCR NEGATIVE NEGATIVE Final    Comment: (NOTE) SARS-CoV-2 target nucleic acids are NOT DETECTED.  The SARS-CoV-2 RNA is generally detectable in upper and lower respiratory specimens during the acute phase of infection. The lowest concentration of SARS-CoV-2 viral copies this assay can detect is 250 copies / mL. A negative result does not preclude SARS-CoV-2 infection and should not be used as the sole basis for treatment or other patient management decisions.  A negative result may occur with improper specimen collection / handling, submission of specimen other than nasopharyngeal swab, presence of viral mutation(s) within the areas targeted by this assay, and inadequate number of viral copies (<250 copies / mL). A negative result must be combined with clinical observations, patient history, and epidemiological information.  Fact Sheet for Patients:   https://www.patel.info/  Fact Sheet for Healthcare Providers: https://hall.com/  This test is not yet approved or  cleared by the [place] FDA and has been authorized for detection and/or diagnosis of SARS-CoV-2 by FDA under an Emergency Use Authorization (EUA).  This EUA will remain in effect (meaning this test can be used) for the duration of the COVID-19 declaration under Section 564(b)(1) of the Act, 21 U.S.C. section 360bbb-3(b)(1), unless the authorization is terminated or revoked sooner.  Performed at [facility], [address], [place], [place] [address]      Radiological Exams on Admission: CT Chest Wo Contrast  Result Date: 08/19/2021 CLINICAL DATA:  Shortness of breath. EXAM: CT CHEST WITHOUT CONTRAST TECHNIQUE: Multidetector CT imaging of the chest was performed following the standard protocol without IV contrast. RADIATION DOSE REDUCTION: This exam was performed  according to the departmental dose-optimization program which includes automated exposure control, adjustment of the mA and/or kV according to patient size and/or use of iterative reconstruction technique. COMPARISON:  June 13, 2020 FINDINGS: Cardiovascular: There is marked severity calcification of the thoracic aorta. The ascending thoracic aorta measures approximately 4.2 cm in diameter. Normal heart size with marked severity coronary artery calcification. No pericardial effusion. Mediastinum/Nodes: No enlarged mediastinal or axillary lymph nodes. Thyroid gland, trachea, and esophagus demonstrate no significant findings. Lungs/Pleura: Mild posterior left upper lobe and moderate severity left basilar linear scarring and/or atelectasis is seen. Mild atelectatic changes are also seen along the periphery of the right middle lobe and right lung base. Pleural based calcification is seen along the anterolateral aspect of the left upper lobe  and bilateral lung bases. There is no evidence of a pleural effusion or pneumothorax. Upper Abdomen: No acute abnormality. Musculoskeletal: Multiple sternal wires are present. Other: Posterior beam hardening artifact is seen secondary to positioning of the patient's upper extremities. Multilevel degenerative changes seen throughout the thoracic spine. IMPRESSION: 1. Prior median sternotomy and aortic valve replacement. 2. Mild posterior left upper lobe and moderate severity left basilar linear scarring and/or atelectasis. 3. Mild right middle lobe and right lower lobe atelectatic changes. 4. Bilateral pleural based calcifications which may represent sequelae associated with prior asbestos exposure. 5. Aortic atherosclerosis and marked severity coronary artery calcification. Aortic Atherosclerosis (ICD10-I70.0). Electronically Signed   By: Virgina Norfolk M.D.   On: 08/19/2021 23:23   CT Head Wo Contrast  Result Date: 08/19/2021 CLINICAL DATA:  Status post fall. EXAM: CT HEAD  WITHOUT CONTRAST TECHNIQUE: Contiguous axial images were obtained from the base of the skull through the vertex without intravenous contrast. RADIATION DOSE REDUCTION: This exam was performed according to the departmental dose-optimization program which includes automated exposure control, adjustment of the mA and/or kV according to patient size and/or use of iterative reconstruction technique. COMPARISON:  None Available. FINDINGS: Brain: There is mild cerebral atrophy with widening of the extra-axial spaces and ventricular dilatation. There are areas of decreased attenuation within the white matter tracts of the supratentorial brain, consistent with microvascular disease changes. Vascular: No hyperdense vessel or unexpected calcification. Skull: Normal. Negative for fracture or focal lesion. Sinuses/Orbits: There is mild left maxillary sinus mucosal thickening. Marked severity bilateral ethmoid sinus mucosal thickening is also seen. Other: None. IMPRESSION: 1. No acute intracranial abnormality. 2. Generalized cerebral atrophy and microvascular disease changes of the supratentorial brain. 3. Marked severity bilateral ethmoid sinus and mild left maxillary sinus disease. Electronically Signed   By: Virgina Norfolk M.D.   On: 08/19/2021 21:33   DG Chest 2 View  Result Date: 08/19/2021 CLINICAL DATA:  Shortness of breath for 2 days EXAM: CHEST - 2 VIEW COMPARISON:  04/27/2021 FINDINGS: Cardiac shadow is enlarged but stable. Postsurgical changes are seen. Aortic calcifications are noted. Lungs are well aerated bilaterally. Stable blunting of the left costophrenic angle is noted. No acute infiltrate or sizable effusion is seen. No acute bony abnormality is noted. IMPRESSION: No acute abnormality Electronically Signed   By: Inez Catalina M.D.   On: 08/19/2021 21:23    EKG: Independently reviewed. SR, LVH.   Assessment/Plan   1. Asthma exacerbation; acute sinusitis  - Presents with worsening fatigue, sinus  congestion and pressure, cough, wheezing and is found to have acute asthma exacerbation likely precipitated by acute sinusitis with postnasal drip  - Give SAMA-SABA now followed by as needed SABA, start prednisone    2. CAD; elevated troponin  - Troponin slightly elevated with no anginal complaints and low suspicion for ACS   3. PAF; mechanical aortic valve  - Continue amiodarone, warfarin   4. CKD IIIb; hyponatremia  - SCr is 1.71 on admission, consistent with his baseline  - Serum sodium is 130, similar to priors  - Renally-dose medications    5. Anemia; thrombocytopenia  - Hgb is 11.3 and platelets 110,000 on admission, both stable    6. OSA  - Continue CPAP qHS    DVT prophylaxis: warfarin  Code Status: DNR, discussed with patient and his son on admission  Level of Care: Level of care: Telemetry Cardiac Family Communication: Son at bedside  Disposition Plan:  Patient is from: Home  Anticipated d/c is to:  TBD Anticipated d/c date is: Possibly as early as 08/21/21  Patient currently: Pending improvement in respiratory status, PT consultation  Consults called: none  Admission status: Observation     Vianne Bulls, MD [facility]  08/20/2021, 5:31 AM

## 2021-08-21 DIAGNOSIS — J45901 Unspecified asthma with (acute) exacerbation: Secondary | ICD-10-CM | POA: Diagnosis not present

## 2021-08-21 LAB — BASIC METABOLIC PANEL
Anion gap: 7 (ref 5–15)
BUN: 24 mg/dL — ABNORMAL HIGH (ref 8–23)
CO2: 20 mmol/L — ABNORMAL LOW (ref 22–32)
Calcium: 8.6 mg/dL — ABNORMAL LOW (ref 8.9–10.3)
Chloride: 104 mmol/L (ref 98–111)
Creatinine, Ser: 1.4 mg/dL — ABNORMAL HIGH (ref 0.61–1.24)
GFR, Estimated: 47 mL/min — ABNORMAL LOW (ref >60)
Glucose, Bld: 112 mg/dL — ABNORMAL HIGH (ref 70–99)
Potassium: 4.4 mmol/L (ref 3.5–5.1)
Sodium: 131 mmol/L — ABNORMAL LOW (ref 135–145)

## 2021-08-21 LAB — CBC
HCT: 30.9 % — ABNORMAL LOW (ref 39.0–52.0)
Hemoglobin: 10.2 g/dL — ABNORMAL LOW (ref 13.0–17.0)
MCH: 30.3 pg (ref 26.0–34.0)
MCHC: 33 g/dL (ref 30.0–36.0)
MCV: 91.7 fL (ref 80.0–100.0)
Platelets: 103 10*3/uL — ABNORMAL LOW (ref 150–400)
RBC: 3.37 MIL/uL — ABNORMAL LOW (ref 4.22–5.81)
RDW: 15 % (ref 11.5–15.5)
WBC: 2.7 10*3/uL — ABNORMAL LOW (ref 4.0–10.5)
nRBC: 0 % (ref 0.0–0.2)

## 2021-08-21 LAB — PROTIME-INR
INR: 2.9 — ABNORMAL HIGH (ref 0.8–1.2)
Prothrombin Time: 29.9 seconds — ABNORMAL HIGH (ref 11.4–15.2)

## 2021-08-21 MED ORDER — IPRATROPIUM-ALBUTEROL 0.5-2.5 (3) MG/3ML IN SOLN: 3.0000 mL | Freq: Four times a day (QID) | RESPIRATORY_TRACT | 0 refills | Status: DC | PRN

## 2021-08-21 MED ORDER — GUAIFENESIN-DM 100-10 MG/5ML PO SYRP: 5.0000 mL | ORAL_SOLUTION | ORAL | 0 refills | Status: DC | PRN

## 2021-08-21 MED ORDER — AMOXICILLIN-POT CLAVULANATE 500-125 MG PO TABS
1.0000 | ORAL_TABLET | Freq: Once | ORAL | Status: AC
  Administered 2021-08-21: 500 mg via ORAL
  Filled 2021-08-21: qty 1

## 2021-08-21 MED ORDER — AMOXICILLIN-POT CLAVULANATE 875-125 MG PO TABS: 1.0000 | ORAL_TABLET | Freq: Two times a day (BID) | ORAL | Status: DC

## 2021-08-21 MED ORDER — ALBUTEROL SULFATE HFA 108 (90 BASE) MCG/ACT IN AERS
1.0000 | INHALATION_SPRAY | RESPIRATORY_TRACT | 1 refills | Status: DC | PRN
Start: 2021-08-21 — End: 2023-05-28

## 2021-08-21 MED ORDER — PREDNISONE 20 MG PO TABS: 40.0000 mg | ORAL_TABLET | Freq: Every day | ORAL | 0 refills | Status: AC

## 2021-08-21 MED ORDER — AMOXICILLIN-POT CLAVULANATE 875-125 MG PO TABS: 1.0000 | ORAL_TABLET | Freq: Two times a day (BID) | ORAL | 0 refills | Status: DC

## 2021-08-21 MED ORDER — LORATADINE 10 MG PO TABS: 10.0000 mg | ORAL_TABLET | Freq: Every day | ORAL | 0 refills | Status: DC

## 2021-08-21 MED ORDER — AMOXICILLIN-POT CLAVULANATE 500-125 MG PO TABS: 1.0000 | ORAL_TABLET | Freq: Two times a day (BID) | ORAL | 0 refills | Status: AC

## 2021-08-21 NOTE — Progress Notes (Signed)
Pt's primary nurse aware. Pt discharged to home in stable condition, all discharge instructions reviewed with and given to pt's son Jillyn Hidden.  Jillyn Hidden and pt give complete verbal understanding.  PIV removed from [facility] with catheter tip intact, no bleeding noted. Pt to leave with walker and nebulizer for home use.  Pt being measured at bedside for walker at this time, will dc from unit.

## 2021-08-24 LAB — CULTURE, BLOOD (ROUTINE X 2)
Culture: NO GROWTH
Culture: NO GROWTH
Special Requests: ADEQUATE

## 2021-08-24 LAB — POCT INR: INR: 3 (ref 2.0–3.0)

## 2021-08-25 ENCOUNTER — Ambulatory Visit (INDEPENDENT_AMBULATORY_CARE_PROVIDER_SITE_OTHER): Payer: Medicare Other | Admitting: Cardiology

## 2021-08-25 DIAGNOSIS — Z952 Presence of prosthetic heart valve: Secondary | ICD-10-CM

## 2021-08-25 DIAGNOSIS — Z7901 Long term (current) use of anticoagulants: Secondary | ICD-10-CM

## 2021-08-25 DIAGNOSIS — Z5181 Encounter for therapeutic drug level monitoring: Secondary | ICD-10-CM

## 2021-08-31 ENCOUNTER — Other Ambulatory Visit: Payer: Self-pay | Admitting: Physician Assistant

## 2021-08-31 ENCOUNTER — Ambulatory Visit
Admission: RE | Admit: 2021-08-31 | Discharge: 2021-08-31 | Disposition: A | Discharge status: Home / Self Care | Payer: Medicare Other | Source: Ambulatory Visit | Attending: Physician Assistant | Admitting: Physician Assistant

## 2021-08-31 ENCOUNTER — Ambulatory Visit (INDEPENDENT_AMBULATORY_CARE_PROVIDER_SITE_OTHER): Payer: Medicare Other | Admitting: *Deleted

## 2021-08-31 DIAGNOSIS — J849 Interstitial pulmonary disease, unspecified: Secondary | ICD-10-CM | POA: Diagnosis not present

## 2021-08-31 DIAGNOSIS — Z7901 Long term (current) use of anticoagulants: Secondary | ICD-10-CM | POA: Diagnosis not present

## 2021-08-31 DIAGNOSIS — M545 Low back pain, unspecified: Secondary | ICD-10-CM | POA: Diagnosis not present

## 2021-08-31 DIAGNOSIS — Z952 Presence of prosthetic heart valve: Secondary | ICD-10-CM

## 2021-08-31 DIAGNOSIS — R109 Unspecified abdominal pain: Secondary | ICD-10-CM | POA: Diagnosis not present

## 2021-08-31 DIAGNOSIS — I480 Paroxysmal atrial fibrillation: Secondary | ICD-10-CM | POA: Diagnosis not present

## 2021-08-31 DIAGNOSIS — W19XXXA Unspecified fall, initial encounter: Secondary | ICD-10-CM

## 2021-08-31 DIAGNOSIS — R10819 Abdominal tenderness, unspecified site: Secondary | ICD-10-CM | POA: Diagnosis not present

## 2021-08-31 DIAGNOSIS — Z043 Encounter for examination and observation following other accident: Secondary | ICD-10-CM | POA: Diagnosis not present

## 2021-08-31 LAB — POCT INR: INR: 2.6 (ref 2.0–3.0)

## 2021-09-07 ENCOUNTER — Ambulatory Visit (INDEPENDENT_AMBULATORY_CARE_PROVIDER_SITE_OTHER): Payer: Medicare Other

## 2021-09-07 DIAGNOSIS — Z952 Presence of prosthetic heart valve: Secondary | ICD-10-CM

## 2021-09-07 DIAGNOSIS — I480 Paroxysmal atrial fibrillation: Secondary | ICD-10-CM

## 2021-09-07 DIAGNOSIS — Z7901 Long term (current) use of anticoagulants: Secondary | ICD-10-CM

## 2021-09-07 LAB — POCT INR: INR: 2.9 (ref 2.0–3.0)

## 2021-09-07 NOTE — Patient Instructions (Signed)
Description   Spoke with pt's son, Dominica Severin, and instructed pt to continue taking warfarin 1 tablet daily except for 1/2 tablet on Monday and Friday. Recheck INR in 1 week (self tester, checks weekly).

## 2021-09-14 ENCOUNTER — Ambulatory Visit (INDEPENDENT_AMBULATORY_CARE_PROVIDER_SITE_OTHER): Payer: Medicare Other

## 2021-09-14 DIAGNOSIS — Z7901 Long term (current) use of anticoagulants: Secondary | ICD-10-CM

## 2021-09-14 DIAGNOSIS — I480 Paroxysmal atrial fibrillation: Secondary | ICD-10-CM

## 2021-09-14 DIAGNOSIS — Z952 Presence of prosthetic heart valve: Secondary | ICD-10-CM

## 2021-09-14 LAB — POCT INR: INR: 3.6 — AB (ref 2.0–3.0)

## 2021-09-14 NOTE — Patient Instructions (Signed)
Description   Spoke with pt's son, Dominica Severin, and instructed pt to eat a serving of greens today andcontinue taking warfarin 1 tablet daily except for 1/2 tablet on Monday and Friday. Recheck INR in 1 week (self tester, checks weekly).

## 2021-09-21 ENCOUNTER — Ambulatory Visit (INDEPENDENT_AMBULATORY_CARE_PROVIDER_SITE_OTHER): Payer: Medicare Other | Admitting: Cardiology

## 2021-09-21 DIAGNOSIS — Z5181 Encounter for therapeutic drug level monitoring: Secondary | ICD-10-CM

## 2021-09-21 DIAGNOSIS — Z1331 Encounter for screening for depression: Secondary | ICD-10-CM | POA: Diagnosis not present

## 2021-09-21 DIAGNOSIS — F0390 Unspecified dementia without behavioral disturbance: Secondary | ICD-10-CM | POA: Diagnosis not present

## 2021-09-21 DIAGNOSIS — Z952 Presence of prosthetic heart valve: Secondary | ICD-10-CM | POA: Diagnosis not present

## 2021-09-21 DIAGNOSIS — I252 Old myocardial infarction: Secondary | ICD-10-CM | POA: Diagnosis not present

## 2021-09-21 DIAGNOSIS — I700 Atherosclerosis of aorta: Secondary | ICD-10-CM | POA: Diagnosis not present

## 2021-09-21 DIAGNOSIS — I10 Essential (primary) hypertension: Secondary | ICD-10-CM | POA: Diagnosis not present

## 2021-09-21 DIAGNOSIS — I4891 Unspecified atrial fibrillation: Secondary | ICD-10-CM | POA: Diagnosis not present

## 2021-09-21 DIAGNOSIS — I2510 Atherosclerotic heart disease of native coronary artery without angina pectoris: Secondary | ICD-10-CM | POA: Diagnosis not present

## 2021-09-21 DIAGNOSIS — R7309 Other abnormal glucose: Secondary | ICD-10-CM | POA: Diagnosis not present

## 2021-09-21 DIAGNOSIS — N1831 Chronic kidney disease, stage 3a: Secondary | ICD-10-CM | POA: Diagnosis not present

## 2021-09-21 DIAGNOSIS — G250 Essential tremor: Secondary | ICD-10-CM | POA: Diagnosis not present

## 2021-09-21 DIAGNOSIS — Z0000 Encounter for general adult medical examination without abnormal findings: Secondary | ICD-10-CM | POA: Diagnosis not present

## 2021-09-21 DIAGNOSIS — Z7901 Long term (current) use of anticoagulants: Secondary | ICD-10-CM

## 2021-09-21 DIAGNOSIS — D696 Thrombocytopenia, unspecified: Secondary | ICD-10-CM | POA: Diagnosis not present

## 2021-09-21 DIAGNOSIS — I509 Heart failure, unspecified: Secondary | ICD-10-CM | POA: Diagnosis not present

## 2021-09-21 DIAGNOSIS — E785 Hyperlipidemia, unspecified: Secondary | ICD-10-CM | POA: Diagnosis not present

## 2021-09-21 LAB — POCT INR: INR: 3.1 — AB (ref 2.0–3.0)

## 2021-09-28 LAB — POCT INR: INR: 3.4 — AB (ref 2.0–3.0)

## 2021-09-29 ENCOUNTER — Telehealth: Payer: Self-pay

## 2021-09-29 ENCOUNTER — Ambulatory Visit (INDEPENDENT_AMBULATORY_CARE_PROVIDER_SITE_OTHER): Payer: Medicare Other | Admitting: Cardiology

## 2021-09-29 DIAGNOSIS — Z952 Presence of prosthetic heart valve: Secondary | ICD-10-CM

## 2021-09-29 DIAGNOSIS — Z5181 Encounter for therapeutic drug level monitoring: Secondary | ICD-10-CM | POA: Diagnosis not present

## 2021-09-29 DIAGNOSIS — Z7901 Long term (current) use of anticoagulants: Secondary | ICD-10-CM

## 2021-09-29 NOTE — Telephone Encounter (Signed)
Lp's son message to check INR. 

## 2021-09-30 ENCOUNTER — Other Ambulatory Visit: Payer: Self-pay | Admitting: Cardiology

## 2021-10-05 DIAGNOSIS — Z7901 Long term (current) use of anticoagulants: Secondary | ICD-10-CM | POA: Diagnosis not present

## 2021-10-05 DIAGNOSIS — Z952 Presence of prosthetic heart valve: Secondary | ICD-10-CM | POA: Diagnosis not present

## 2021-10-05 DIAGNOSIS — I480 Paroxysmal atrial fibrillation: Secondary | ICD-10-CM | POA: Diagnosis not present

## 2021-10-05 LAB — POCT INR: INR: 2.9 (ref 2.0–3.0)

## 2021-10-06 ENCOUNTER — Ambulatory Visit (INDEPENDENT_AMBULATORY_CARE_PROVIDER_SITE_OTHER): Payer: Medicare Other | Admitting: Cardiology

## 2021-10-06 DIAGNOSIS — Z952 Presence of prosthetic heart valve: Secondary | ICD-10-CM | POA: Diagnosis not present

## 2021-10-06 DIAGNOSIS — Z5181 Encounter for therapeutic drug level monitoring: Secondary | ICD-10-CM

## 2021-10-06 DIAGNOSIS — Z7901 Long term (current) use of anticoagulants: Secondary | ICD-10-CM

## 2021-10-12 ENCOUNTER — Ambulatory Visit (INDEPENDENT_AMBULATORY_CARE_PROVIDER_SITE_OTHER): Payer: Medicare Other

## 2021-10-12 DIAGNOSIS — Z7901 Long term (current) use of anticoagulants: Secondary | ICD-10-CM

## 2021-10-12 DIAGNOSIS — Z952 Presence of prosthetic heart valve: Secondary | ICD-10-CM

## 2021-10-12 DIAGNOSIS — I480 Paroxysmal atrial fibrillation: Secondary | ICD-10-CM

## 2021-10-12 LAB — POCT INR: INR: 2.7 (ref 2.0–3.0)

## 2021-10-12 NOTE — Patient Instructions (Signed)
Description   Spoke with pt's son, Dominica Severin, and instructed pt to continue taking warfarin 1 tablet daily except for 1/2 tablet on Monday and Friday. Recheck INR in 1 week (self tester, checks weekly).

## 2021-10-19 ENCOUNTER — Ambulatory Visit (INDEPENDENT_AMBULATORY_CARE_PROVIDER_SITE_OTHER): Payer: Medicare Other | Admitting: *Deleted

## 2021-10-19 DIAGNOSIS — Z7901 Long term (current) use of anticoagulants: Secondary | ICD-10-CM

## 2021-10-19 DIAGNOSIS — I480 Paroxysmal atrial fibrillation: Secondary | ICD-10-CM | POA: Diagnosis not present

## 2021-10-19 DIAGNOSIS — Z952 Presence of prosthetic heart valve: Secondary | ICD-10-CM | POA: Diagnosis not present

## 2021-10-19 LAB — POCT INR: INR: 2.8 (ref 2.0–3.0)

## 2021-10-20 ENCOUNTER — Telehealth: Payer: Self-pay

## 2021-10-20 NOTE — Telephone Encounter (Signed)
Lp's son message to call back to discuss INR

## 2021-10-26 ENCOUNTER — Ambulatory Visit (INDEPENDENT_AMBULATORY_CARE_PROVIDER_SITE_OTHER): Payer: Medicare Other | Admitting: Cardiology

## 2021-10-26 ENCOUNTER — Telehealth: Payer: Self-pay

## 2021-10-26 DIAGNOSIS — Z5181 Encounter for therapeutic drug level monitoring: Secondary | ICD-10-CM

## 2021-10-26 DIAGNOSIS — Z7901 Long term (current) use of anticoagulants: Secondary | ICD-10-CM

## 2021-10-26 DIAGNOSIS — Z952 Presence of prosthetic heart valve: Secondary | ICD-10-CM | POA: Diagnosis not present

## 2021-10-26 LAB — POCT INR: INR: 3.8 — AB (ref 2.0–3.0)

## 2021-10-26 NOTE — Telephone Encounter (Signed)
Lp's son message to check INR. 

## 2021-11-03 ENCOUNTER — Telehealth: Payer: Self-pay

## 2021-11-03 ENCOUNTER — Ambulatory Visit (INDEPENDENT_AMBULATORY_CARE_PROVIDER_SITE_OTHER): Payer: Medicare Other | Admitting: Cardiology

## 2021-11-03 DIAGNOSIS — Z7901 Long term (current) use of anticoagulants: Secondary | ICD-10-CM | POA: Diagnosis not present

## 2021-11-03 DIAGNOSIS — Z952 Presence of prosthetic heart valve: Secondary | ICD-10-CM

## 2021-11-03 DIAGNOSIS — Z5181 Encounter for therapeutic drug level monitoring: Secondary | ICD-10-CM

## 2021-11-03 DIAGNOSIS — I480 Paroxysmal atrial fibrillation: Secondary | ICD-10-CM | POA: Diagnosis not present

## 2021-11-03 LAB — POCT INR: INR: 3.1 — AB (ref 2.0–3.0)

## 2021-11-03 NOTE — Telephone Encounter (Signed)
Lp's son Tanner Ferguson) message to check INR today.

## 2021-11-09 ENCOUNTER — Ambulatory Visit (INDEPENDENT_AMBULATORY_CARE_PROVIDER_SITE_OTHER): Payer: Medicare Other | Admitting: Internal Medicine

## 2021-11-09 DIAGNOSIS — Z952 Presence of prosthetic heart valve: Secondary | ICD-10-CM | POA: Diagnosis not present

## 2021-11-09 DIAGNOSIS — Z7901 Long term (current) use of anticoagulants: Secondary | ICD-10-CM

## 2021-11-09 DIAGNOSIS — Z5181 Encounter for therapeutic drug level monitoring: Secondary | ICD-10-CM | POA: Diagnosis not present

## 2021-11-09 LAB — POCT INR: INR: 3.7 — AB (ref 2.0–3.0)

## 2021-11-16 ENCOUNTER — Ambulatory Visit (INDEPENDENT_AMBULATORY_CARE_PROVIDER_SITE_OTHER): Payer: Medicare Other

## 2021-11-16 DIAGNOSIS — Z7901 Long term (current) use of anticoagulants: Secondary | ICD-10-CM | POA: Diagnosis not present

## 2021-11-16 DIAGNOSIS — Z952 Presence of prosthetic heart valve: Secondary | ICD-10-CM | POA: Diagnosis not present

## 2021-11-16 DIAGNOSIS — I480 Paroxysmal atrial fibrillation: Secondary | ICD-10-CM | POA: Diagnosis not present

## 2021-11-16 LAB — POCT INR: INR: 4.1 — AB (ref 2.0–3.0)

## 2021-11-16 NOTE — Patient Instructions (Signed)
Description   Spoke with pt's son, Dominica Severin, and instructed pt to hold today's dose and then START taking warfarin 1 tablet daily except for 1/2 tablet on Monday, Wednesdays and Friday.  Recheck INR in 1 week (self tester, checks weekly).

## 2021-11-19 DIAGNOSIS — Z23 Encounter for immunization: Secondary | ICD-10-CM | POA: Diagnosis not present

## 2021-11-23 ENCOUNTER — Ambulatory Visit (INDEPENDENT_AMBULATORY_CARE_PROVIDER_SITE_OTHER): Payer: Medicare Other

## 2021-11-23 DIAGNOSIS — N390 Urinary tract infection, site not specified: Secondary | ICD-10-CM | POA: Diagnosis not present

## 2021-11-23 DIAGNOSIS — I480 Paroxysmal atrial fibrillation: Secondary | ICD-10-CM | POA: Diagnosis not present

## 2021-11-23 DIAGNOSIS — Z952 Presence of prosthetic heart valve: Secondary | ICD-10-CM

## 2021-11-23 DIAGNOSIS — Z7901 Long term (current) use of anticoagulants: Secondary | ICD-10-CM | POA: Diagnosis not present

## 2021-11-23 DIAGNOSIS — R531 Weakness: Secondary | ICD-10-CM | POA: Diagnosis not present

## 2021-11-23 LAB — POCT INR: INR: 2.8 (ref 2.0–3.0)

## 2021-11-23 NOTE — Patient Instructions (Signed)
Description   Spoke with pt's son, Gary, and instructed for pt to continue taking warfarin 1 tablet daily except for 1/2 tablet on Monday, Wednesdays and Friday. Stay consistent with greens (1-2 timers per week).   Recheck INR in 1 week. (self tester, checks weekly).       

## 2021-11-30 ENCOUNTER — Ambulatory Visit (INDEPENDENT_AMBULATORY_CARE_PROVIDER_SITE_OTHER): Payer: Medicare Other | Admitting: *Deleted

## 2021-11-30 DIAGNOSIS — Z952 Presence of prosthetic heart valve: Secondary | ICD-10-CM | POA: Diagnosis not present

## 2021-11-30 DIAGNOSIS — Z7901 Long term (current) use of anticoagulants: Secondary | ICD-10-CM | POA: Diagnosis not present

## 2021-11-30 DIAGNOSIS — I480 Paroxysmal atrial fibrillation: Secondary | ICD-10-CM | POA: Diagnosis not present

## 2021-11-30 LAB — POCT INR: INR: 2.2 (ref 2.0–3.0)

## 2021-11-30 NOTE — Patient Instructions (Signed)
Description   Spoke with pt's son, Dominica Severin, and instructed for pt to resume normal green intake and continue taking warfarin 1 tablet daily except for 1/2 tablet on Monday, Wednesdays and Friday. Stay consistent with greens (1-2 timers per week).   Recheck INR in 1 week (self tester, checks weekly).

## 2021-12-07 ENCOUNTER — Ambulatory Visit (INDEPENDENT_AMBULATORY_CARE_PROVIDER_SITE_OTHER): Payer: Medicare Other | Admitting: Internal Medicine

## 2021-12-07 DIAGNOSIS — I480 Paroxysmal atrial fibrillation: Secondary | ICD-10-CM | POA: Diagnosis not present

## 2021-12-07 DIAGNOSIS — Z5181 Encounter for therapeutic drug level monitoring: Secondary | ICD-10-CM

## 2021-12-07 DIAGNOSIS — Z952 Presence of prosthetic heart valve: Secondary | ICD-10-CM | POA: Diagnosis not present

## 2021-12-07 DIAGNOSIS — Z7901 Long term (current) use of anticoagulants: Secondary | ICD-10-CM | POA: Diagnosis not present

## 2021-12-07 LAB — POCT INR: INR: 2.4 (ref 2.0–3.0)

## 2021-12-14 ENCOUNTER — Ambulatory Visit (INDEPENDENT_AMBULATORY_CARE_PROVIDER_SITE_OTHER): Payer: Medicare Other | Admitting: Cardiology

## 2021-12-14 DIAGNOSIS — Z5181 Encounter for therapeutic drug level monitoring: Secondary | ICD-10-CM

## 2021-12-14 LAB — POCT INR: INR: 3.6 — AB (ref 2.0–3.0)

## 2021-12-17 LAB — POCT INR: INR: 3.1 — AB (ref 2.0–3.0)

## 2021-12-18 ENCOUNTER — Telehealth: Payer: Medicare Other | Admitting: Family Medicine

## 2021-12-18 DIAGNOSIS — J019 Acute sinusitis, unspecified: Secondary | ICD-10-CM

## 2021-12-18 DIAGNOSIS — B9689 Other specified bacterial agents as the cause of diseases classified elsewhere: Secondary | ICD-10-CM | POA: Diagnosis not present

## 2021-12-18 MED ORDER — PREDNISONE 20 MG PO TABS: 20.0000 mg | ORAL_TABLET | Freq: Every day | ORAL | 0 refills | Status: AC

## 2021-12-18 MED ORDER — AMOXICILLIN-POT CLAVULANATE 875-125 MG PO TABS: 1.0000 | ORAL_TABLET | Freq: Two times a day (BID) | ORAL | 0 refills | Status: DC

## 2021-12-18 NOTE — Progress Notes (Signed)
E-Visit for Sinus Problems  We are sorry that you are not feeling well.  Here is how we plan to help!  Based on what you have shared with me it looks like you have sinusitis.  Sinusitis is inflammation and infection in the sinus cavities of the head.  Based on your presentation I believe you most likely have Acute Bacterial Sinusitis.  This is an infection caused by bacteria and is treated with antibiotics. I have prescribed Augmentin '875mg'$ /'125mg'$  one tablet twice daily with food, for 7 days.and prednisone.You may use an oral decongestant such as Mucinex D or if you have glaucoma or high blood pressure use plain Mucinex. Saline nasal spray help and can safely be used as often as needed for congestion.  If you develop worsening sinus pain, fever or notice severe headache and vision changes, or if symptoms are not better after completion of antibiotic, please schedule an appointment with a health care provider.    Sinus infections are not as easily transmitted as other respiratory infection, however we still recommend that you avoid close contact with loved ones, especially the very young and elderly.  Remember to wash your hands thoroughly throughout the day as this is the number one way to prevent the spread of infection!  Home Care: Only take medications as instructed by your medical team. Complete the entire course of an antibiotic. Do not take these medications with alcohol. A steam or ultrasonic humidifier can help congestion.  You can place a towel over your head and breathe in the steam from hot water coming from a faucet. Avoid close contacts especially the very young and the elderly. Cover your mouth when you cough or sneeze. Always remember to wash your hands.  Get Help Right Away If: You develop worsening fever or sinus pain. You develop a severe head ache or visual changes. Your symptoms persist after you have completed your treatment plan.  Make sure you Understand these  instructions. Will watch your condition. Will get help right away if you are not doing well or get worse.  Thank you for choosing an e-visit.  Your e-visit answers were reviewed by a board certified advanced clinical practitioner to complete your personal care plan. Depending upon the condition, your plan could have included both over the counter or prescription medications.  Please review your pharmacy choice. Make sure the pharmacy is open so you can pick up prescription now. If there is a problem, you may contact your provider through [employer] and have the prescription routed to another pharmacy.  Your safety is important to [place]. If you have drug allergies check your prescription carefully.   For the next 24 hours you can use MyChart to ask questions about today's visit, request a non-urgent call back, or ask for a work or school excuse. You will get an email in the next two days asking about your experience. I hope that your e-visit has been valuable and will speed your recovery.   I have provided 5 minutes of non face to face time during this encounter for chart review and documentation.

## 2021-12-21 ENCOUNTER — Ambulatory Visit (INDEPENDENT_AMBULATORY_CARE_PROVIDER_SITE_OTHER): Payer: Medicare Other

## 2021-12-21 DIAGNOSIS — I480 Paroxysmal atrial fibrillation: Secondary | ICD-10-CM

## 2021-12-21 DIAGNOSIS — Z7901 Long term (current) use of anticoagulants: Secondary | ICD-10-CM | POA: Diagnosis not present

## 2021-12-21 DIAGNOSIS — Z952 Presence of prosthetic heart valve: Secondary | ICD-10-CM | POA: Diagnosis not present

## 2021-12-21 LAB — POCT INR: INR: 4.3 — AB (ref 2.0–3.0)

## 2021-12-21 NOTE — Patient Instructions (Signed)
Description   Spoke with pt's son, Dominica Severin, and instructed for pt to HOLD today's dose and only take 1/2 tablet tomorrow and then resume taking warfarin 1 tablet daily except for 1/2 tablet on Monday, Wednesdays and Friday. Stay consistent with greens (1-2 timers per week).   Recheck INR on Thursday since you are on Prednisone. (self tester, checks weekly).

## 2021-12-24 ENCOUNTER — Ambulatory Visit (INDEPENDENT_AMBULATORY_CARE_PROVIDER_SITE_OTHER): Payer: Medicare Other | Admitting: *Deleted

## 2021-12-24 DIAGNOSIS — Z952 Presence of prosthetic heart valve: Secondary | ICD-10-CM | POA: Diagnosis not present

## 2021-12-24 DIAGNOSIS — Z7901 Long term (current) use of anticoagulants: Secondary | ICD-10-CM | POA: Diagnosis not present

## 2021-12-24 DIAGNOSIS — I480 Paroxysmal atrial fibrillation: Secondary | ICD-10-CM

## 2021-12-24 LAB — POCT INR: INR: 3.1 — AB (ref 2.0–3.0)

## 2021-12-28 ENCOUNTER — Ambulatory Visit (INDEPENDENT_AMBULATORY_CARE_PROVIDER_SITE_OTHER): Payer: Medicare Other | Admitting: *Deleted

## 2021-12-28 DIAGNOSIS — Z7901 Long term (current) use of anticoagulants: Secondary | ICD-10-CM | POA: Diagnosis not present

## 2021-12-28 DIAGNOSIS — I480 Paroxysmal atrial fibrillation: Secondary | ICD-10-CM | POA: Diagnosis not present

## 2021-12-28 DIAGNOSIS — Z952 Presence of prosthetic heart valve: Secondary | ICD-10-CM | POA: Diagnosis not present

## 2021-12-28 LAB — POCT INR: INR: 2 (ref 2.0–3.0)

## 2021-12-28 NOTE — Patient Instructions (Signed)
Description   Spoke with pt's son, Dominica Severin, and instructed for pt to take 1 tablet today and then continue taking warfarin 1 tablet daily except for 1/2 tablet on Monday, Wednesdays and Friday. Stay consistent with greens (1-2 timers per week).   Recheck INR in 1 week. (self tester, checks weekly).

## 2021-12-31 ENCOUNTER — Telehealth: Payer: Self-pay

## 2021-12-31 ENCOUNTER — Telehealth: Payer: Self-pay | Admitting: *Deleted

## 2021-12-31 NOTE — Telephone Encounter (Signed)
Son, Dominica Severin, left a voicemail stating to call them in reference to a bill. Returned a call to the son (on [place]). Dominica Severin son stated that a Dr. Stanford Breed was on his bill and it has been filled incorrectly for that one day. He states that he has been through this with another bill with the hospital and has not received any assistance. Advised that our Doctor of the day was Doctor Stanford Breed  so that is why his name is on the bill but the billing dept may be able to assist more and explain to him. Advised to call the main number [phone] and use option 7, and told him to listen to the prompts because that option may have changed. Apologized for the inconvenience and he states he will try the number to get this resolved.

## 2022-01-01 NOTE — Telephone Encounter (Signed)
done

## 2022-01-04 ENCOUNTER — Ambulatory Visit (INDEPENDENT_AMBULATORY_CARE_PROVIDER_SITE_OTHER): Payer: Medicare Other

## 2022-01-04 DIAGNOSIS — I480 Paroxysmal atrial fibrillation: Secondary | ICD-10-CM

## 2022-01-04 DIAGNOSIS — Z7901 Long term (current) use of anticoagulants: Secondary | ICD-10-CM | POA: Diagnosis not present

## 2022-01-04 DIAGNOSIS — Z952 Presence of prosthetic heart valve: Secondary | ICD-10-CM

## 2022-01-04 LAB — POCT INR: INR: 2.6 (ref 2.0–3.0)

## 2022-01-04 NOTE — Patient Instructions (Signed)
Description   Spoke with pt's son, Dominica Severin, and instructed for pt to continue taking warfarin 1 tablet daily except for 1/2 tablet on Monday, Wednesdays and Friday. Stay consistent with greens (1-2 timers per week).   Recheck INR in 1 week. (self tester, checks weekly).

## 2022-01-11 ENCOUNTER — Telehealth: Payer: Self-pay

## 2022-01-11 ENCOUNTER — Ambulatory Visit (INDEPENDENT_AMBULATORY_CARE_PROVIDER_SITE_OTHER): Payer: Medicare Other | Admitting: Cardiology

## 2022-01-11 DIAGNOSIS — Z5181 Encounter for therapeutic drug level monitoring: Secondary | ICD-10-CM | POA: Diagnosis not present

## 2022-01-11 LAB — POCT INR: INR: 3.3 — AB (ref 2.0–3.0)

## 2022-01-11 NOTE — Telephone Encounter (Signed)
Lp's son message to check INR.

## 2022-01-18 ENCOUNTER — Ambulatory Visit (INDEPENDENT_AMBULATORY_CARE_PROVIDER_SITE_OTHER): Payer: Medicare Other

## 2022-01-18 DIAGNOSIS — Z952 Presence of prosthetic heart valve: Secondary | ICD-10-CM

## 2022-01-18 DIAGNOSIS — Z7901 Long term (current) use of anticoagulants: Secondary | ICD-10-CM

## 2022-01-18 DIAGNOSIS — I480 Paroxysmal atrial fibrillation: Secondary | ICD-10-CM | POA: Diagnosis not present

## 2022-01-18 LAB — POCT INR: INR: 3.7 — AB (ref 2.0–3.0)

## 2022-01-18 NOTE — Patient Instructions (Signed)
Description   Spoke with pt's son, Dominica Severin, and instructed for pt to HOLD today's dose and then continue taking warfarin 1 tablet daily except for 1/2 tablet on Monday, Wednesdays and Friday. Stay consistent with greens (1-2 timers per week).   Recheck INR in 1 week. (self tester, checks weekly).

## 2022-01-25 ENCOUNTER — Ambulatory Visit (INDEPENDENT_AMBULATORY_CARE_PROVIDER_SITE_OTHER): Payer: Medicare Other | Admitting: *Deleted

## 2022-01-25 DIAGNOSIS — Z7901 Long term (current) use of anticoagulants: Secondary | ICD-10-CM | POA: Diagnosis not present

## 2022-01-25 DIAGNOSIS — Z952 Presence of prosthetic heart valve: Secondary | ICD-10-CM | POA: Diagnosis not present

## 2022-01-25 DIAGNOSIS — I480 Paroxysmal atrial fibrillation: Secondary | ICD-10-CM | POA: Diagnosis not present

## 2022-01-25 LAB — POCT INR: INR: 3.6 — AB (ref 2.0–3.0)

## 2022-01-25 NOTE — Patient Instructions (Signed)
Description   Spoke with pt's son, Dominica Severin, and instructed for pt to HOLD today's dose and then start taking warfarin 1/2 tablet daily except for 1 tablet on Sunday, Tuesday, and Thursday. Stay consistent with greens (1-2 timers per week).   Recheck INR in 1 week. (self tester, checks weekly).

## 2022-02-01 ENCOUNTER — Ambulatory Visit (INDEPENDENT_AMBULATORY_CARE_PROVIDER_SITE_OTHER): Payer: Medicare Other

## 2022-02-01 DIAGNOSIS — Z7901 Long term (current) use of anticoagulants: Secondary | ICD-10-CM | POA: Diagnosis not present

## 2022-02-01 DIAGNOSIS — I480 Paroxysmal atrial fibrillation: Secondary | ICD-10-CM | POA: Diagnosis not present

## 2022-02-01 DIAGNOSIS — Z952 Presence of prosthetic heart valve: Secondary | ICD-10-CM | POA: Diagnosis not present

## 2022-02-01 LAB — POCT INR: INR: 2.7 (ref 2.0–3.0)

## 2022-02-01 NOTE — Patient Instructions (Signed)
Description   Spoke with pt's son, Dominica Severin, and instructed for pt to continue taking warfarin 1/2 tablet daily except for 1 tablet on Sunday, Tuesday, and Thursday. Stay consistent with greens (1-2 timers per week).   Recheck INR in 1 week. (self tester, checks weekly).

## 2022-02-04 ENCOUNTER — Ambulatory Visit (HOSPITAL_BASED_OUTPATIENT_CLINIC_OR_DEPARTMENT_OTHER): Payer: Medicare Other | Admitting: Cardiology

## 2022-02-08 ENCOUNTER — Ambulatory Visit (INDEPENDENT_AMBULATORY_CARE_PROVIDER_SITE_OTHER): Payer: Medicare Other | Admitting: Cardiology

## 2022-02-08 DIAGNOSIS — Z5181 Encounter for therapeutic drug level monitoring: Secondary | ICD-10-CM | POA: Diagnosis not present

## 2022-02-08 DIAGNOSIS — Z952 Presence of prosthetic heart valve: Secondary | ICD-10-CM

## 2022-02-08 DIAGNOSIS — Z7901 Long term (current) use of anticoagulants: Secondary | ICD-10-CM

## 2022-02-08 LAB — POCT INR: INR: 2.9 (ref 2.0–3.0)

## 2022-02-15 ENCOUNTER — Ambulatory Visit (INDEPENDENT_AMBULATORY_CARE_PROVIDER_SITE_OTHER): Payer: Medicare Other

## 2022-02-15 DIAGNOSIS — Z7901 Long term (current) use of anticoagulants: Secondary | ICD-10-CM | POA: Diagnosis not present

## 2022-02-15 DIAGNOSIS — Z952 Presence of prosthetic heart valve: Secondary | ICD-10-CM

## 2022-02-15 DIAGNOSIS — I480 Paroxysmal atrial fibrillation: Secondary | ICD-10-CM | POA: Diagnosis not present

## 2022-02-15 LAB — POCT INR: INR: 2.7 (ref 2.0–3.0)

## 2022-02-15 NOTE — Patient Instructions (Signed)
Description   Spoke with pt's son, Dominica Severin, and instructed for pt to continue taking warfarin 1/2 tablet daily except for 1 tablet on Sunday, Tuesday, and Thursday. Stay consistent with greens (1-2 timers per week).   Recheck INR in 1 week. (self tester, checks weekly).

## 2022-02-18 ENCOUNTER — Ambulatory Visit (HOSPITAL_BASED_OUTPATIENT_CLINIC_OR_DEPARTMENT_OTHER): Payer: Medicare Other | Admitting: Cardiology

## 2022-02-23 ENCOUNTER — Ambulatory Visit (INDEPENDENT_AMBULATORY_CARE_PROVIDER_SITE_OTHER): Payer: Medicare Other

## 2022-02-23 DIAGNOSIS — Z952 Presence of prosthetic heart valve: Secondary | ICD-10-CM | POA: Diagnosis not present

## 2022-02-23 DIAGNOSIS — I480 Paroxysmal atrial fibrillation: Secondary | ICD-10-CM

## 2022-02-23 DIAGNOSIS — Z7901 Long term (current) use of anticoagulants: Secondary | ICD-10-CM | POA: Diagnosis not present

## 2022-02-23 LAB — POCT INR: INR: 2.4 (ref 2.0–3.0)

## 2022-02-23 NOTE — Patient Instructions (Signed)
Description   Spoke with pt's son, Dominica Severin, and instructed for pt to take 1.5 tablets today and then continue taking warfarin 1/2 tablet daily except for 1 tablet on Sunday, Tuesday, and Thursday. Stay consistent with greens (1-2 timers per week).   Recheck INR in 1 week. (self tester, checks weekly).

## 2022-03-02 ENCOUNTER — Ambulatory Visit (INDEPENDENT_AMBULATORY_CARE_PROVIDER_SITE_OTHER): Payer: Medicare Other | Admitting: Internal Medicine

## 2022-03-02 ENCOUNTER — Telehealth: Payer: Self-pay

## 2022-03-02 DIAGNOSIS — Z7901 Long term (current) use of anticoagulants: Secondary | ICD-10-CM

## 2022-03-02 DIAGNOSIS — Z952 Presence of prosthetic heart valve: Secondary | ICD-10-CM | POA: Diagnosis not present

## 2022-03-02 DIAGNOSIS — Z5181 Encounter for therapeutic drug level monitoring: Secondary | ICD-10-CM | POA: Diagnosis not present

## 2022-03-02 DIAGNOSIS — I480 Paroxysmal atrial fibrillation: Secondary | ICD-10-CM | POA: Diagnosis not present

## 2022-03-02 LAB — POCT INR: INR: 3.6 — AB (ref 2.0–3.0)

## 2022-03-02 NOTE — Telephone Encounter (Signed)
done

## 2022-03-08 ENCOUNTER — Ambulatory Visit (INDEPENDENT_AMBULATORY_CARE_PROVIDER_SITE_OTHER): Payer: Medicare Other | Admitting: Cardiology

## 2022-03-08 DIAGNOSIS — Z952 Presence of prosthetic heart valve: Secondary | ICD-10-CM

## 2022-03-08 DIAGNOSIS — Z5181 Encounter for therapeutic drug level monitoring: Secondary | ICD-10-CM

## 2022-03-08 DIAGNOSIS — Z7901 Long term (current) use of anticoagulants: Secondary | ICD-10-CM

## 2022-03-08 LAB — POCT INR: INR: 3.5 — AB (ref 2.0–3.0)

## 2022-03-15 ENCOUNTER — Telehealth: Payer: Self-pay | Admitting: Cardiology

## 2022-03-15 ENCOUNTER — Ambulatory Visit (INDEPENDENT_AMBULATORY_CARE_PROVIDER_SITE_OTHER): Payer: Medicare Other | Admitting: Cardiology

## 2022-03-15 DIAGNOSIS — Z952 Presence of prosthetic heart valve: Secondary | ICD-10-CM

## 2022-03-15 DIAGNOSIS — Z7901 Long term (current) use of anticoagulants: Secondary | ICD-10-CM

## 2022-03-15 DIAGNOSIS — Z5181 Encounter for therapeutic drug level monitoring: Secondary | ICD-10-CM

## 2022-03-15 LAB — POCT INR: INR: 3.4 — AB (ref 2.0–3.0)

## 2022-03-15 NOTE — Telephone Encounter (Signed)
Pt's son is calling to make sure that it will be okay to reschedule 6 mo appt to warmer weather. Requesting call back to discuss.

## 2022-03-15 NOTE — Telephone Encounter (Signed)
Pt. Cancelled appointments on 12/7, 12/21, and 1/16. When do you recommend he reschedule ASAP or okay to wait until warmer weather

## 2022-03-16 ENCOUNTER — Ambulatory Visit (HOSPITAL_BASED_OUTPATIENT_CLINIC_OR_DEPARTMENT_OTHER): Payer: Medicare Other | Admitting: Cardiology

## 2022-03-18 NOTE — Telephone Encounter (Signed)
If he is stable it's ok to wait until warmer weathet

## 2022-03-18 NOTE — Telephone Encounter (Signed)
Per Dr. Harrell Gave as long as patient is stable he can come a little later. Returned call to patient's son, scheduled patient for Feb 22nd at 1:20pm with Dr. Harrell Gave.

## 2022-03-22 ENCOUNTER — Ambulatory Visit (INDEPENDENT_AMBULATORY_CARE_PROVIDER_SITE_OTHER): Payer: Medicare Other | Admitting: *Deleted

## 2022-03-22 DIAGNOSIS — I480 Paroxysmal atrial fibrillation: Secondary | ICD-10-CM

## 2022-03-22 DIAGNOSIS — Z952 Presence of prosthetic heart valve: Secondary | ICD-10-CM

## 2022-03-22 DIAGNOSIS — Z7901 Long term (current) use of anticoagulants: Secondary | ICD-10-CM

## 2022-03-22 LAB — POCT INR: INR: 3 (ref 2.0–3.0)

## 2022-03-22 NOTE — Patient Instructions (Signed)
Description   Spoke with pt's son, Tanner Ferguson, and instructed for pt to continue taking warfarin 1/2 tablet daily except for 1 tablet on Sunday, Tuesday, and Thursday. Stay consistent with greens (1-2 timers per week).   Recheck INR in 1 week. (self tester, checks weekly).

## 2022-03-26 ENCOUNTER — Telehealth: Payer: Self-pay | Admitting: Pulmonary Disease

## 2022-03-26 NOTE — Telephone Encounter (Signed)
Called son back and she states that his dad if having a lot of issues getting to doctor appointments due to mobility and age. Son is in his 97s so its hard for him to get out and about as well.   Son is asking if there is a need for his dad to come in for follows up with his OSA. Son states there is not change with his breathing and his cpap machine. Son is just asking if we can continue to send in order for supplies. He states that it really hard to get dad out the house. He states can his OSA care be transferred to his primary care doctor.   Son states that if the cpap is something that Dr Lysle Rubens can handle he rather his dad go to one doctor for all his care. His kidney doctor is also handing over care to his [facility].   Please advise sir

## 2022-03-29 ENCOUNTER — Ambulatory Visit (INDEPENDENT_AMBULATORY_CARE_PROVIDER_SITE_OTHER): Payer: Medicare Other | Admitting: Cardiovascular Disease

## 2022-03-29 DIAGNOSIS — Z5181 Encounter for therapeutic drug level monitoring: Secondary | ICD-10-CM

## 2022-03-29 DIAGNOSIS — Z952 Presence of prosthetic heart valve: Secondary | ICD-10-CM

## 2022-03-29 DIAGNOSIS — Z7901 Long term (current) use of anticoagulants: Secondary | ICD-10-CM

## 2022-03-29 LAB — POCT INR: INR: 2.8 (ref 2.0–3.0)

## 2022-03-31 NOTE — Telephone Encounter (Signed)
Called and spoke with patients son Tanner Ferguson [facility]).  Dr. Matilde Bash recommendations given. Tanner Ferguson stated patient has scheduled follow up with Dr. Lorenda Hatchet next week.  Tanner Ferguson stated he would call and schedule a video visit with Dr. Vaughan Browner, if needed after speaking with Dr. Lorenda Hatchet.

## 2022-03-31 NOTE — Telephone Encounter (Signed)
Yes. If Dr. Lorenda Hatchet if comfortable handling the CPAP order then we can had over care to him. Other option would be to offer virtual visits

## 2022-04-05 ENCOUNTER — Ambulatory Visit (INDEPENDENT_AMBULATORY_CARE_PROVIDER_SITE_OTHER): Payer: Medicare Other | Admitting: *Deleted

## 2022-04-05 DIAGNOSIS — Z7901 Long term (current) use of anticoagulants: Secondary | ICD-10-CM

## 2022-04-05 DIAGNOSIS — I480 Paroxysmal atrial fibrillation: Secondary | ICD-10-CM

## 2022-04-05 DIAGNOSIS — Z952 Presence of prosthetic heart valve: Secondary | ICD-10-CM

## 2022-04-05 LAB — POCT INR: INR: 3.3 — AB (ref 2.0–3.0)

## 2022-04-05 NOTE — Patient Instructions (Signed)
Description   Spoke with pt's son, Dominica Severin, and instructed for pt to continue taking warfarin 1/2 tablet daily except for 1 tablet on Sunday, Tuesday, and Thursday. Stay consistent with greens (1-2 timers per week).   Recheck INR in 1 week. (self tester, checks weekly).

## 2022-04-11 ENCOUNTER — Emergency Department (HOSPITAL_BASED_OUTPATIENT_CLINIC_OR_DEPARTMENT_OTHER)
Admission: EM | Admit: 2022-04-11 | Discharge: 2022-04-11 | Disposition: A | Discharge status: Home / Self Care | Payer: Medicare Other | Source: Home / Self Care | Attending: Emergency Medicine | Admitting: Emergency Medicine

## 2022-04-11 ENCOUNTER — Emergency Department (HOSPITAL_BASED_OUTPATIENT_CLINIC_OR_DEPARTMENT_OTHER): Payer: Medicare Other

## 2022-04-11 ENCOUNTER — Encounter (HOSPITAL_BASED_OUTPATIENT_CLINIC_OR_DEPARTMENT_OTHER): Payer: Self-pay | Admitting: Emergency Medicine

## 2022-04-11 ENCOUNTER — Other Ambulatory Visit: Payer: Self-pay

## 2022-04-11 DIAGNOSIS — D696 Thrombocytopenia, unspecified: Secondary | ICD-10-CM | POA: Diagnosis present

## 2022-04-11 DIAGNOSIS — Z66 Do not resuscitate: Secondary | ICD-10-CM | POA: Diagnosis not present

## 2022-04-11 DIAGNOSIS — M4696 Unspecified inflammatory spondylopathy, lumbar region: Secondary | ICD-10-CM | POA: Diagnosis not present

## 2022-04-11 DIAGNOSIS — D62 Acute posthemorrhagic anemia: Secondary | ICD-10-CM | POA: Diagnosis present

## 2022-04-11 DIAGNOSIS — Z8546 Personal history of malignant neoplasm of prostate: Secondary | ICD-10-CM | POA: Insufficient documentation

## 2022-04-11 DIAGNOSIS — E871 Hypo-osmolality and hyponatremia: Secondary | ICD-10-CM | POA: Diagnosis present

## 2022-04-11 DIAGNOSIS — D61818 Other pancytopenia: Secondary | ICD-10-CM | POA: Diagnosis present

## 2022-04-11 DIAGNOSIS — I5022 Chronic systolic (congestive) heart failure: Secondary | ICD-10-CM | POA: Diagnosis not present

## 2022-04-11 DIAGNOSIS — Y842 Radiological procedure and radiotherapy as the cause of abnormal reaction of the patient, or of later complication, without mention of misadventure at the time of the procedure: Secondary | ICD-10-CM | POA: Diagnosis present

## 2022-04-11 DIAGNOSIS — F03911 Unspecified dementia, unspecified severity, with agitation: Secondary | ICD-10-CM | POA: Diagnosis present

## 2022-04-11 DIAGNOSIS — N3289 Other specified disorders of bladder: Secondary | ICD-10-CM | POA: Diagnosis not present

## 2022-04-11 DIAGNOSIS — F05 Delirium due to known physiological condition: Secondary | ICD-10-CM | POA: Diagnosis present

## 2022-04-11 DIAGNOSIS — I252 Old myocardial infarction: Secondary | ICD-10-CM | POA: Diagnosis not present

## 2022-04-11 DIAGNOSIS — R531 Weakness: Secondary | ICD-10-CM | POA: Diagnosis not present

## 2022-04-11 DIAGNOSIS — J45909 Unspecified asthma, uncomplicated: Secondary | ICD-10-CM | POA: Diagnosis present

## 2022-04-11 DIAGNOSIS — I77810 Thoracic aortic ectasia: Secondary | ICD-10-CM | POA: Diagnosis present

## 2022-04-11 DIAGNOSIS — D6832 Hemorrhagic disorder due to extrinsic circulating anticoagulants: Secondary | ICD-10-CM | POA: Diagnosis present

## 2022-04-11 DIAGNOSIS — N390 Urinary tract infection, site not specified: Secondary | ICD-10-CM | POA: Diagnosis not present

## 2022-04-11 DIAGNOSIS — Z952 Presence of prosthetic heart valve: Secondary | ICD-10-CM | POA: Diagnosis not present

## 2022-04-11 DIAGNOSIS — I2510 Atherosclerotic heart disease of native coronary artery without angina pectoris: Secondary | ICD-10-CM | POA: Diagnosis present

## 2022-04-11 DIAGNOSIS — R319 Hematuria, unspecified: Secondary | ICD-10-CM | POA: Diagnosis present

## 2022-04-11 DIAGNOSIS — G250 Essential tremor: Secondary | ICD-10-CM | POA: Diagnosis present

## 2022-04-11 DIAGNOSIS — Z79899 Other long term (current) drug therapy: Secondary | ICD-10-CM | POA: Diagnosis not present

## 2022-04-11 DIAGNOSIS — R310 Gross hematuria: Secondary | ICD-10-CM | POA: Diagnosis not present

## 2022-04-11 DIAGNOSIS — G40909 Epilepsy, unspecified, not intractable, without status epilepticus: Secondary | ICD-10-CM | POA: Diagnosis present

## 2022-04-11 DIAGNOSIS — I4891 Unspecified atrial fibrillation: Secondary | ICD-10-CM | POA: Diagnosis not present

## 2022-04-11 DIAGNOSIS — I480 Paroxysmal atrial fibrillation: Secondary | ICD-10-CM | POA: Diagnosis present

## 2022-04-11 DIAGNOSIS — I130 Hypertensive heart and chronic kidney disease with heart failure and stage 1 through stage 4 chronic kidney disease, or unspecified chronic kidney disease: Secondary | ICD-10-CM | POA: Diagnosis present

## 2022-04-11 DIAGNOSIS — K5790 Diverticulosis of intestine, part unspecified, without perforation or abscess without bleeding: Secondary | ICD-10-CM | POA: Diagnosis not present

## 2022-04-11 DIAGNOSIS — N1832 Chronic kidney disease, stage 3b: Secondary | ICD-10-CM | POA: Diagnosis present

## 2022-04-11 DIAGNOSIS — M419 Scoliosis, unspecified: Secondary | ICD-10-CM | POA: Diagnosis not present

## 2022-04-11 DIAGNOSIS — E785 Hyperlipidemia, unspecified: Secondary | ICD-10-CM | POA: Diagnosis present

## 2022-04-11 DIAGNOSIS — T45515A Adverse effect of anticoagulants, initial encounter: Secondary | ICD-10-CM | POA: Diagnosis present

## 2022-04-11 LAB — URINALYSIS, W/ REFLEX TO CULTURE (INFECTION SUSPECTED)
Bilirubin Urine: NEGATIVE
Glucose, UA: NEGATIVE mg/dL
Ketones, ur: NEGATIVE mg/dL
Leukocytes,Ua: NEGATIVE
Nitrite: NEGATIVE
Protein, ur: 30 mg/dL — AB
Specific Gravity, Urine: 1.01 (ref 1.005–1.030)
pH: 7 (ref 5.0–8.0)

## 2022-04-11 MED ORDER — SODIUM CHLORIDE 0.9 % IR SOLN
1000.0000 mL | Status: DC
  Administered 2022-04-11: 1000 mL
  Filled 2022-04-11: qty 1000

## 2022-04-11 NOTE — ED Notes (Signed)
To obtain bladder scan after another attempt at voiding. Will CT renal afterwards to look for stones or blockage.

## 2022-04-11 NOTE — ED Provider Notes (Signed)
[facility] Provider Note   CSN: [id number] Arrival date & time: 04/11/22  1121     History  Chief Complaint  Patient presents with   Hematuria    Tanner Ferguson is a 89 y.o. male with history of valve replacement on Coumadin presenting to the ED with complaint of hematuria.  Symptom onset about 5 days ago according to his daughter-in-law who helps take care of him.  He had some brief hematuria which seemed to resolve, but then returned again 2 days ago.  Family did a U dip at home which was negative for leukocytes and nitrites.  Patient is now passing bloody clots and reporting difficulty with urination and emptying his bladder, and also painful urination.  No history of kidney stones.  He does have a history of prostate cancer that underwent prostate treatment over 10 years ago in the past.  No other medical problems.  His daughter-in-law says she checked the INR every week and it is always therapeutic.  No changes in his Coumadin dosing  HPI     Home Medications Prior to Admission medications   Medication Sig Start Date End Date Taking? Authorizing Provider  albuterol (VENTOLIN HFA) 108 (90 Base) MCG/ACT inhaler Inhale 1-2 puffs into the lungs every 4 (four) hours as needed for wheezing or shortness of breath. 08/21/21   Shelly Coss, MD  amiodarone (PACERONE) 200 MG tablet Take 1/2 (one-half) tablet by mouth once daily 09/30/21   Buford Dresser, MD  amoxicillin-clavulanate (AUGMENTIN) 875-125 MG tablet Take 1 tablet by mouth 2 (two) times daily. 12/18/21   Tempie Hoist, FNP  aspirin EC 81 MG tablet Take 1 tablet (81 mg total) by mouth daily with breakfast. 10/21/18   Denton Brick, Courage, MD  fluticasone (FLONASE) 50 MCG/ACT nasal spray Place 1 spray into both nostrils daily.    [provider]  guaiFENesin-dextromethorphan (ROBITUSSIN DM) 100-10 MG/5ML syrup Take 5 mLs by mouth every 4 (four) hours as needed for cough.  08/21/21   Shelly Coss, MD  ipratropium-albuterol (DUONEB) 0.5-2.5 (3) MG/3ML SOLN Take 3 mLs by nebulization every 6 (six) hours as needed. 08/21/21   Shelly Coss, MD  loratadine (CLARITIN) 10 MG tablet Take 1 tablet (10 mg total) by mouth daily for 7 days. 08/21/21 08/28/21  Shelly Coss, MD  rosuvastatin (CRESTOR) 20 MG tablet Take 1 tablet (20 mg total) by mouth every evening. 10/21/18 10/03/21  Roxan Hockey, MD  sodium chloride (OCEAN) 0.65 % SOLN nasal spray Place 1 spray into both nostrils as needed for congestion. 07/17/19   Darr, Edison Nasuti, PA-C  SYMBICORT 80-4.5 MCG/ACT inhaler Inhale 2 puffs by mouth twice daily 10/22/19   Mannam, Hart Robinsons, MD  tamsulosin (FLOMAX) 0.4 MG CAPS capsule Take 0.4 mg by mouth daily after supper.     [provider]  warfarin (COUMADIN) 2.5 MG tablet Take 0.5-1 tablet daily or as prescribed by [facility] Clinic Patient taking differently: Take 1.25-2.5 mg by mouth daily. 2.5 mg on Sunday, Tuesday, Wednesday, Thursday, Saturday 1.25 mg on Monday, Friday 04/23/20   Buford Dresser, MD      Allergies    Penicillins    Review of Systems   Review of Systems  Physical Exam Updated Vital Signs BP (!) 162/69   Pulse (!) 50   Temp 98.4 F (36.9 C) (Oral)   Resp 17   Wt 70.3 kg   SpO2 97%   BMI 30.27 kg/m  Physical Exam Constitutional:  General: He is not in acute distress. HENT:     Head: Normocephalic and atraumatic.  Eyes:     Conjunctiva/sclera: Conjunctivae normal.     Pupils: Pupils are equal, round, and reactive to light.  Cardiovascular:     Rate and Rhythm: Normal rate and regular rhythm.  Pulmonary:     Effort: Pulmonary effort is normal. No respiratory distress.  Abdominal:     General: There is no distension.     Tenderness: There is no abdominal tenderness.  Skin:    General: Skin is warm and dry.  Neurological:     General: No focal deficit present.     Mental Status: He is alert. Mental status is at  baseline.  Psychiatric:        Mood and Affect: Mood normal.        Behavior: Behavior normal.     ED Results / Procedures / Treatments   Labs (all labs ordered are listed, but only abnormal results are displayed) Labs Reviewed  URINALYSIS, W/ REFLEX TO CULTURE (INFECTION SUSPECTED) - Abnormal; Notable for the following components:      Result Value   Hgb urine dipstick LARGE (*)    Protein, ur 30 (*)    Bacteria, UA FEW (*)    All other components within normal limits    EKG None  Radiology CT Renal Stone Study  Result Date: 04/11/2022 CLINICAL DATA:  Abdominal pain, gross hematuria EXAM: CT ABDOMEN AND PELVIS WITHOUT CONTRAST TECHNIQUE: Multidetector CT imaging of the abdomen and pelvis was performed following the standard protocol without IV contrast. RADIATION DOSE REDUCTION: This exam was performed according to the departmental dose-optimization program which includes automated exposure control, adjustment of the mA and/or kV according to patient size and/or use of iterative reconstruction technique. COMPARISON:  None Available. FINDINGS: Lower chest: No acute abnormality. Bilateral calcified pleural plaques along the diaphragmatic surface. Hepatobiliary: No focal liver abnormality is seen. No gallstones, gallbladder wall thickening, or biliary dilatation. Pancreas: Unremarkable. No pancreatic ductal dilatation or surrounding inflammatory changes. Spleen: Normal in size without focal abnormality. Adrenals/Urinary Tract: Adrenal glands are unremarkable. Kidneys are normal, without renal calculi, focal lesion, or hydronephrosis. Bladder is unremarkable. Stomach/Bowel: Stomach is within normal limits. No evidence of bowel wall thickening, distention, or inflammatory changes. Moderate amount of stool in the ascending and transverse colon. Diverticulosis without evidence of diverticulitis. Haziness in the mesenteric fat as can be seen with mesenteric panniculitis or can be idiopathic.  Vascular/Lymphatic: Aortic atherosclerosis. No enlarged abdominal or pelvic lymph nodes. Reproductive: Prostate is unremarkable. Prostatic radiation seeds noted. Other: No abdominal wall hernia or abnormality. No abdominopelvic ascites. Musculoskeletal: No acute osseous abnormality. No aggressive osseous lesion. Degenerative disease with disc height loss at L1-2, L4-5 and L5-S1. Bilateral facet arthropathy throughout the lumbar spine. Levoscoliosis of the lumbar spine. IMPRESSION: 1. No acute abdominal or pelvic pathology. 2. Diverticulosis without evidence of diverticulitis. 3. Haziness in the mesenteric fat as can be seen with mesenteric panniculitis or can be idiopathic. 4. Bilateral calcified pleural plaques along the diaphragmatic surface consistent with prior asbestos exposure. 5.  Aortic Atherosclerosis (ICD10-I70.0). Electronically Signed   By: Kathreen Devoid M.D.   On: 04/11/2022 13:05    Procedures Procedures    Medications Ordered in ED Medications  sodium chloride irrigation 0.9 % 1,000 mL (1,000 mLs Irrigation New Bag/Given 04/11/22 1430)    ED Course/ Medical Decision Making/ A&P Clinical Course as of 04/11/22 1538  Sun Apr 11, 2022  1347 Patient has  been unable to urinate is now having significant discomfort with attempted urination.  I asked nursing staff to place Foley and irrigate bladder [MT]    Clinical Course User Index [MT] Wendi Lastra, Carola Rhine, MD                             Medical Decision Making Amount and/or Complexity of Data Reviewed Radiology: ordered.  Risk Prescription drug management.   Patient is here with hematuria, initially painless, now having some discomfort which I suspect is related to this clot.  He is attempting to urinate here as we can check a postvoid residual.  He does have some suprapubic discomfort.  Differential could include UTI versus ureteral colic versus bladder malignancy versus other.  CT imaging was ordered proceed with interpreted  with no clear cause of the patient's hematuria.  This does not exclude the possibility of a bladder neoplasm.  Bladder was irrigated with 3 L of fluid.  Urine appears left clear but is still slightly blood-tinged.  Patient will need referral to urology.  We will allow the Foley catheter remain in place.  His son will be taking him home.  Reluctant discontinue the Coumadin that he has been on a stable dose for some many years due to his valvular disease.  I advised to continue to monitor the color of his urine at home; but with the urine clearing with irrigation here, I think is reasonable to continue the Coumadin for now.         Final Clinical Impression(s) / ED Diagnoses Final diagnoses:  Hematuria, unspecified type    Rx / DC Orders ED Discharge Orders     None         Soul Hackman, Carola Rhine, MD 04/11/22 1538

## 2022-04-11 NOTE — ED Triage Notes (Signed)
Pt arrives pov, to triage in wheelchair, c/o hematuria x 5 days that resolved and returned today. Endorses scrotal pain. Neg home UTI test. Pt endorses thinners

## 2022-04-12 ENCOUNTER — Other Ambulatory Visit: Payer: Self-pay

## 2022-04-12 ENCOUNTER — Inpatient Hospital Stay (HOSPITAL_COMMUNITY)
Admission: EM | Admit: 2022-04-12 | Discharge: 2022-04-18 | DRG: 813 | Disposition: A | Discharge status: Home / Self Care | Payer: Medicare Other | Attending: Internal Medicine | Admitting: Internal Medicine

## 2022-04-12 ENCOUNTER — Encounter (HOSPITAL_COMMUNITY): Payer: Self-pay | Admitting: Internal Medicine

## 2022-04-12 DIAGNOSIS — I5022 Chronic systolic (congestive) heart failure: Secondary | ICD-10-CM | POA: Diagnosis present

## 2022-04-12 DIAGNOSIS — I130 Hypertensive heart and chronic kidney disease with heart failure and stage 1 through stage 4 chronic kidney disease, or unspecified chronic kidney disease: Secondary | ICD-10-CM | POA: Diagnosis present

## 2022-04-12 DIAGNOSIS — Z7951 Long term (current) use of inhaled steroids: Secondary | ICD-10-CM

## 2022-04-12 DIAGNOSIS — N3289 Other specified disorders of bladder: Secondary | ICD-10-CM | POA: Diagnosis not present

## 2022-04-12 DIAGNOSIS — R531 Weakness: Secondary | ICD-10-CM | POA: Diagnosis not present

## 2022-04-12 DIAGNOSIS — Z952 Presence of prosthetic heart valve: Secondary | ICD-10-CM

## 2022-04-12 DIAGNOSIS — T45515A Adverse effect of anticoagulants, initial encounter: Secondary | ICD-10-CM | POA: Diagnosis present

## 2022-04-12 DIAGNOSIS — N390 Urinary tract infection, site not specified: Secondary | ICD-10-CM | POA: Insufficient documentation

## 2022-04-12 DIAGNOSIS — Z8249 Family history of ischemic heart disease and other diseases of the circulatory system: Secondary | ICD-10-CM

## 2022-04-12 DIAGNOSIS — J45909 Unspecified asthma, uncomplicated: Secondary | ICD-10-CM | POA: Diagnosis present

## 2022-04-12 DIAGNOSIS — E785 Hyperlipidemia, unspecified: Secondary | ICD-10-CM | POA: Diagnosis present

## 2022-04-12 DIAGNOSIS — D6832 Hemorrhagic disorder due to extrinsic circulating anticoagulants: Secondary | ICD-10-CM | POA: Diagnosis present

## 2022-04-12 DIAGNOSIS — F05 Delirium due to known physiological condition: Secondary | ICD-10-CM | POA: Diagnosis present

## 2022-04-12 DIAGNOSIS — F03918 Unspecified dementia, unspecified severity, with other behavioral disturbance: Secondary | ICD-10-CM | POA: Diagnosis present

## 2022-04-12 DIAGNOSIS — Z801 Family history of malignant neoplasm of trachea, bronchus and lung: Secondary | ICD-10-CM

## 2022-04-12 DIAGNOSIS — I252 Old myocardial infarction: Secondary | ICD-10-CM

## 2022-04-12 DIAGNOSIS — D62 Acute posthemorrhagic anemia: Secondary | ICD-10-CM | POA: Diagnosis present

## 2022-04-12 DIAGNOSIS — Z79899 Other long term (current) drug therapy: Secondary | ICD-10-CM | POA: Diagnosis not present

## 2022-04-12 DIAGNOSIS — Z880 Allergy status to penicillin: Secondary | ICD-10-CM

## 2022-04-12 DIAGNOSIS — Y842 Radiological procedure and radiotherapy as the cause of abnormal reaction of the patient, or of later complication, without mention of misadventure at the time of the procedure: Secondary | ICD-10-CM | POA: Diagnosis present

## 2022-04-12 DIAGNOSIS — G250 Essential tremor: Secondary | ICD-10-CM | POA: Diagnosis present

## 2022-04-12 DIAGNOSIS — Z923 Personal history of irradiation: Secondary | ICD-10-CM

## 2022-04-12 DIAGNOSIS — I4891 Unspecified atrial fibrillation: Secondary | ICD-10-CM | POA: Diagnosis present

## 2022-04-12 DIAGNOSIS — Z8546 Personal history of malignant neoplasm of prostate: Secondary | ICD-10-CM

## 2022-04-12 DIAGNOSIS — I77810 Thoracic aortic ectasia: Secondary | ICD-10-CM | POA: Diagnosis present

## 2022-04-12 DIAGNOSIS — G40909 Epilepsy, unspecified, not intractable, without status epilepticus: Secondary | ICD-10-CM | POA: Diagnosis present

## 2022-04-12 DIAGNOSIS — R310 Gross hematuria: Secondary | ICD-10-CM | POA: Diagnosis present

## 2022-04-12 DIAGNOSIS — I2510 Atherosclerotic heart disease of native coronary artery without angina pectoris: Secondary | ICD-10-CM | POA: Diagnosis present

## 2022-04-12 DIAGNOSIS — E871 Hypo-osmolality and hyponatremia: Secondary | ICD-10-CM | POA: Diagnosis present

## 2022-04-12 DIAGNOSIS — R319 Hematuria, unspecified: Secondary | ICD-10-CM | POA: Diagnosis present

## 2022-04-12 DIAGNOSIS — R791 Abnormal coagulation profile: Secondary | ICD-10-CM | POA: Diagnosis present

## 2022-04-12 DIAGNOSIS — F03911 Unspecified dementia, unspecified severity, with agitation: Secondary | ICD-10-CM | POA: Diagnosis present

## 2022-04-12 DIAGNOSIS — N1832 Chronic kidney disease, stage 3b: Secondary | ICD-10-CM | POA: Diagnosis present

## 2022-04-12 DIAGNOSIS — Z66 Do not resuscitate: Secondary | ICD-10-CM | POA: Diagnosis not present

## 2022-04-12 DIAGNOSIS — D696 Thrombocytopenia, unspecified: Secondary | ICD-10-CM | POA: Diagnosis present

## 2022-04-12 DIAGNOSIS — D61818 Other pancytopenia: Secondary | ICD-10-CM | POA: Diagnosis present

## 2022-04-12 DIAGNOSIS — D649 Anemia, unspecified: Secondary | ICD-10-CM | POA: Diagnosis present

## 2022-04-12 DIAGNOSIS — I10 Essential (primary) hypertension: Secondary | ICD-10-CM | POA: Diagnosis present

## 2022-04-12 DIAGNOSIS — I480 Paroxysmal atrial fibrillation: Secondary | ICD-10-CM | POA: Diagnosis present

## 2022-04-12 DIAGNOSIS — R300 Dysuria: Secondary | ICD-10-CM | POA: Diagnosis present

## 2022-04-12 DIAGNOSIS — G4733 Obstructive sleep apnea (adult) (pediatric): Secondary | ICD-10-CM

## 2022-04-12 DIAGNOSIS — Z7901 Long term (current) use of anticoagulants: Secondary | ICD-10-CM

## 2022-04-12 DIAGNOSIS — F0390 Unspecified dementia without behavioral disturbance: Secondary | ICD-10-CM | POA: Diagnosis present

## 2022-04-12 DIAGNOSIS — T839XXA Unspecified complication of genitourinary prosthetic device, implant and graft, initial encounter: Secondary | ICD-10-CM

## 2022-04-12 DIAGNOSIS — Z87891 Personal history of nicotine dependence: Secondary | ICD-10-CM

## 2022-04-12 DIAGNOSIS — K5790 Diverticulosis of intestine, part unspecified, without perforation or abscess without bleeding: Secondary | ICD-10-CM | POA: Diagnosis present

## 2022-04-12 DIAGNOSIS — Z7982 Long term (current) use of aspirin: Secondary | ICD-10-CM

## 2022-04-12 LAB — CBC WITH DIFFERENTIAL/PLATELET
Abs Immature Granulocytes: 0.01 10*3/uL (ref 0.00–0.07)
Basophils Absolute: 0 10*3/uL (ref 0.0–0.1)
Basophils Relative: 0 %
Eosinophils Absolute: 0 10*3/uL (ref 0.0–0.5)
Eosinophils Relative: 0 %
HCT: 35.3 % — ABNORMAL LOW (ref 39.0–52.0)
Hemoglobin: 11.4 g/dL — ABNORMAL LOW (ref 13.0–17.0)
Immature Granulocytes: 0 %
Lymphocytes Relative: 8 %
Lymphs Abs: 0.4 10*3/uL — ABNORMAL LOW (ref 0.7–4.0)
MCH: 30 pg (ref 26.0–34.0)
MCHC: 32.3 g/dL (ref 30.0–36.0)
MCV: 92.9 fL (ref 80.0–100.0)
Monocytes Absolute: 0.4 10*3/uL (ref 0.1–1.0)
Monocytes Relative: 7 %
Neutro Abs: 4.7 10*3/uL (ref 1.7–7.7)
Neutrophils Relative %: 85 %
Platelets: 112 10*3/uL — ABNORMAL LOW (ref 150–400)
RBC: 3.8 MIL/uL — ABNORMAL LOW (ref 4.22–5.81)
RDW: 14.6 % (ref 11.5–15.5)
WBC: 5.6 10*3/uL (ref 4.0–10.5)
nRBC: 0 % (ref 0.0–0.2)

## 2022-04-12 LAB — URINALYSIS, ROUTINE W REFLEX MICROSCOPIC
Bilirubin Urine: NEGATIVE
Glucose, UA: NEGATIVE mg/dL
Ketones, ur: NEGATIVE mg/dL
Nitrite: NEGATIVE
Protein, ur: 100 mg/dL — AB
RBC / HPF: >50 RBC/hpf (ref 0–5)
Specific Gravity, Urine: 1.014 (ref 1.005–1.030)
WBC, UA: >50 WBC/hpf (ref 0–5)
pH: 7 (ref 5.0–8.0)

## 2022-04-12 LAB — BASIC METABOLIC PANEL
Anion gap: 7 (ref 5–15)
BUN: 27 mg/dL — ABNORMAL HIGH (ref 8–23)
CO2: 22 mmol/L (ref 22–32)
Calcium: 8.8 mg/dL — ABNORMAL LOW (ref 8.9–10.3)
Chloride: 101 mmol/L (ref 98–111)
Creatinine, Ser: 1.45 mg/dL — ABNORMAL HIGH (ref 0.61–1.24)
GFR, Estimated: 45 mL/min — ABNORMAL LOW (ref >60)
Glucose, Bld: 112 mg/dL — ABNORMAL HIGH (ref 70–99)
Potassium: 4 mmol/L (ref 3.5–5.1)
Sodium: 130 mmol/L — ABNORMAL LOW (ref 135–145)

## 2022-04-12 LAB — PROTIME-INR
INR: 4.2 (ref 0.8–1.2)
Prothrombin Time: 39.9 seconds — ABNORMAL HIGH (ref 11.4–15.2)

## 2022-04-12 LAB — TYPE AND SCREEN
ABO/RH(D): A NEG
Antibody Screen: NEGATIVE

## 2022-04-12 LAB — ABO/RH: ABO/RH(D): A NEG

## 2022-04-12 LAB — HEMOGLOBIN AND HEMATOCRIT, BLOOD
HCT: 38.8 % — ABNORMAL LOW (ref 39.0–52.0)
Hemoglobin: 12.3 g/dL — ABNORMAL LOW (ref 13.0–17.0)

## 2022-04-12 MED ORDER — ROSUVASTATIN CALCIUM 20 MG PO TABS
20.0000 mg | ORAL_TABLET | Freq: Every evening | ORAL | Status: DC
  Administered 2022-04-12 – 2022-04-17 (×6): 20 mg via ORAL
  Filled 2022-04-12 (×6): qty 1

## 2022-04-12 MED ORDER — ACETAMINOPHEN 325 MG PO TABS
650.0000 mg | ORAL_TABLET | Freq: Four times a day (QID) | ORAL | Status: DC | PRN
  Administered 2022-04-12: 650 mg via ORAL
  Filled 2022-04-12: qty 2

## 2022-04-12 MED ORDER — AMIODARONE HCL 100 MG PO TABS
100.0000 mg | ORAL_TABLET | Freq: Every day | ORAL | Status: DC
  Administered 2022-04-12 – 2022-04-17 (×6): 100 mg via ORAL
  Filled 2022-04-12 (×7): qty 1

## 2022-04-12 MED ORDER — PHENAZOPYRIDINE HCL 100 MG PO TABS
200.0000 mg | ORAL_TABLET | Freq: Three times a day (TID) | ORAL | Status: DC | PRN
  Administered 2022-04-12 (×2): 200 mg via ORAL
  Filled 2022-04-12 (×2): qty 2

## 2022-04-12 MED ORDER — WARFARIN - PHARMACIST DOSING INPATIENT: Freq: Every day | Status: DC

## 2022-04-12 MED ORDER — CEPHALEXIN 500 MG PO CAPS
500.0000 mg | ORAL_CAPSULE | Freq: Three times a day (TID) | ORAL | Status: DC
  Administered 2022-04-12 – 2022-04-18 (×18): 500 mg via ORAL
  Filled 2022-04-12 (×18): qty 1

## 2022-04-12 MED ORDER — ALBUTEROL SULFATE (2.5 MG/3ML) 0.083% IN NEBU: 2.5000 mg | INHALATION_SOLUTION | RESPIRATORY_TRACT | Status: DC | PRN

## 2022-04-12 MED ORDER — MIRABEGRON ER 25 MG PO TB24
25.0000 mg | ORAL_TABLET | Freq: Every day | ORAL | Status: DC
  Administered 2022-04-12 – 2022-04-18 (×7): 25 mg via ORAL
  Filled 2022-04-12 (×7): qty 1

## 2022-04-12 MED ORDER — VITAMIN K1 10 MG/ML IJ SOLN
3.0000 mg | Freq: Once | INTRAMUSCULAR | Status: AC
  Administered 2022-04-12: 3 mg via SUBCUTANEOUS
  Filled 2022-04-12: qty 0.3

## 2022-04-12 MED ORDER — HYDRALAZINE HCL 25 MG PO TABS: 25.0000 mg | ORAL_TABLET | Freq: Four times a day (QID) | ORAL | Status: DC | PRN

## 2022-04-12 MED ORDER — FENTANYL CITRATE PF 50 MCG/ML IJ SOSY
25.0000 ug | PREFILLED_SYRINGE | INTRAMUSCULAR | Status: DC | PRN
  Administered 2022-04-12 – 2022-04-13 (×4): 25 ug via INTRAVENOUS
  Filled 2022-04-12 (×4): qty 1

## 2022-04-12 MED ORDER — ACETAMINOPHEN 650 MG RE SUPP: 650.0000 mg | Freq: Four times a day (QID) | RECTAL | Status: DC | PRN

## 2022-04-12 MED ORDER — MOMETASONE FURO-FORMOTEROL FUM 100-5 MCG/ACT IN AERO
2.0000 | INHALATION_SPRAY | Freq: Two times a day (BID) | RESPIRATORY_TRACT | Status: DC
  Administered 2022-04-12 – 2022-04-17 (×11): 2 via RESPIRATORY_TRACT
  Filled 2022-04-12 (×2): qty 8.8

## 2022-04-12 MED ORDER — CHLORHEXIDINE GLUCONATE CLOTH 2 % EX PADS
6.0000 | MEDICATED_PAD | Freq: Every day | CUTANEOUS | Status: DC
  Administered 2022-04-12 – 2022-04-17 (×6): 6 via TOPICAL

## 2022-04-12 MED ORDER — ALBUTEROL SULFATE HFA 108 (90 BASE) MCG/ACT IN AERS: 1.0000 | INHALATION_SPRAY | RESPIRATORY_TRACT | Status: DC | PRN

## 2022-04-12 MED ORDER — TAMSULOSIN HCL 0.4 MG PO CAPS
0.4000 mg | ORAL_CAPSULE | Freq: Every day | ORAL | Status: DC
  Administered 2022-04-12 – 2022-04-17 (×6): 0.4 mg via ORAL
  Filled 2022-04-12 (×5): qty 1

## 2022-04-12 MED ORDER — ONDANSETRON HCL 4 MG/2ML IJ SOLN: 4.0000 mg | Freq: Four times a day (QID) | INTRAMUSCULAR | Status: DC | PRN

## 2022-04-12 MED ORDER — ONDANSETRON HCL 4 MG PO TABS: 4.0000 mg | ORAL_TABLET | Freq: Four times a day (QID) | ORAL | Status: DC | PRN

## 2022-04-12 MED ORDER — FENTANYL CITRATE PF 50 MCG/ML IJ SOSY
25.0000 ug | PREFILLED_SYRINGE | Freq: Once | INTRAMUSCULAR | Status: AC
  Administered 2022-04-12: 25 ug via INTRAVENOUS
  Filled 2022-04-12: qty 1

## 2022-04-12 MED ORDER — GUAIFENESIN-DM 100-10 MG/5ML PO SYRP: 5.0000 mL | ORAL_SOLUTION | ORAL | Status: DC | PRN

## 2022-04-12 NOTE — Progress Notes (Addendum)
Patient complained of penile irritation. Pt reports burning at meatus. Catheter care performed, neosporin ointment applied. Bladder scan completed 137 cc. Bladder irrigation performed with 30 cc per Urologist recommendation. Catheter is draining well. Urine is cherry red, no clots. No additional orders received from provider at this time.

## 2022-04-12 NOTE — Progress Notes (Signed)
Bladder scan completed 0 cc.

## 2022-04-12 NOTE — Progress Notes (Signed)
ANTICOAGULATION CONSULT NOTE - Initial Consult  Pharmacy Consult for Warfarin dosing Indication: atrial fibrillation, mechanical aortic valve   Allergies  Allergen Reactions   Penicillins Rash    Patient Measurements: Height: 5' (152.4 cm) Weight: 70.3 kg (155 lb) IBW/kg (Calculated) : 50   Vital Signs: Temp: 98 F (36.7 C) (02/12 0838) Temp Source: Oral (02/12 0838) BP: 115/69 (02/12 0838) Pulse Rate: 54 (02/12 0838)  Labs: Recent Labs    04/12/22 0455 04/12/22 0505  HGB 11.4*  --   HCT 35.3*  --   PLT 112*  --   LABPROT  --  39.9*  INR  --  4.2*  CREATININE 1.45*  --     Estimated Creatinine Clearance: 26.7 mL/min (A) (by C-G formula based on SCr of 1.45 mg/dL (H)).   Medical History: Past Medical History:  Diagnosis Date   Asthma    CAD (coronary artery disease)    s/p NSTEMI 9/19. Cath in [place], [place] 10/19. Severe CAD not amenable to PCI. Not CABG candidate   CHF (congestive heart failure) (HCC)    EF 40-45% by echo due to iCM. EF 36% by Myoview 9/19   Dementia (HCC)    Essential tremor    Hyperlipidemia    Intolerant of statins   Hypertension    PAF (paroxysmal atrial fibrillation) ([place])    Pancytopenia ([place])    Follows with hematology   Prostate cancer ([place])    Seizure ([place])     Assessment:  Tanner Ferguson is a 89 y.o. male with history of dementia, CHF, history of pancytopenia, prostate cancer, seizure disorder, CAD, history NSTEMI, paroxysmal atrial fibrillation, mechanical aortic valve on warfarin who presented to the emergency department with complaints of Foley catheter malfunction and hematuria.   Goal of Therapy:  INR 2.5-3.5 Monitor platelets by anticoagulation protocol: Yes   Plan:  INR=4.2 Supratherapeutic. Phytonadione 70m SQ once was given. Plts= 112, low Hgb=11.4, low but stable  Will hold home Warfarin until INR at goal range.  Continue to monitor Hgb and Plts.   Tanner Ferguson WPietro Cassis2/01/2023,9:35 AM

## 2022-04-12 NOTE — H&P (Signed)
History and Physical    Patient: Tanner Ferguson [id number] DOB: Jan 31, 1928 DOA: 04/12/2022 DOS: the patient was seen and examined on 04/12/2022 PCP: Wenda Low, MD  Patient coming from: Home  Chief Complaint:  Chief Complaint  Patient presents with   Foley Catheter Complications   HPI: Tanner Ferguson is a 89 y.o. male with medical history significant of asthma, dementia, essential tremor, hyperlipidemia, hypertension,  history of pancytopenia, prostate cancer, seizure disorder, CAD, history NSTEMI, history of systolic CHF with EF of [id number] on Myoview stress test in September 2019, recovered EF of 60 to 65% on echocardiogram in 2020, paroxysmal atrial fibrillation, mechanical aortic valve on warfarin who presented to the emergency department with complaints of Foley catheter malfunction and hematuria.  He was initially seen yesterday afternoon at [facility] with a 5-day history of hematuria that almost resolved but then returned.  They placed a Foley catheter, but subsequently had to come to [facility] due to persistent hematuria and suprapubic pain.   No flank pain and mild dysuria.  No polyuria, polydipsia, polyphagia or blurred vision. He denied fever, chills, rhinorrhea, sore throat, wheezing or hemoptysis.  No chest pain, palpitations, diaphoresis, PND, orthopnea or pitting edema of the lower extremities.  No abdominal pain, nausea, emesis, diarrhea, constipation, melena or hematochezia.  Lab work: His urinalysis was cloudy with large hemoglobinuria, moderate leukocyte esterase and proteinuria of 100 mg/dL.  Microscopic show more than 50 RBC more than 50 WBC, rare bacteria and positive WBC clumps.  CBC is her white count 5.6, hemoglobin 11.4 g/dL platelets 112.  PT 39.9 and INR 4.2.  BMP showed a sodium 130 mmol/L, normal potassium chloride and CO2.  Glucose 112, BUN 27, creatinine 1.45 and calcium 8.8 mg/dL.  Imaging: CT renal protocol with no acute abdominal or pelvic pathology.  There was  diverticulosis with no diverticulitis.  Haziness in the mesenteric fat as can be seen with mesenteric panniculitis or can be idiopathic.  Bilateral calcified pleural plaques along the diaphragmatic surface consistent with prior asbestos exposure.  Aortic atherosclerosis.  ED course: Initial vital signs were temperature 99 F, pulse 61, respiration 18, BP 154/77 mmHg O2 sat 90% on room air.   Review of Systems: As mentioned in the history of present illness. All other systems reviewed and are negative. Past Medical History:  Diagnosis Date   Asthma    CAD (coronary artery disease)    s/p NSTEMI 9/19. Cath in [place], [place] 10/19. Severe CAD not amenable to PCI. Not CABG candidate   CHF (congestive heart failure) (HCC)    EF 40-45% by echo due to iCM. EF 36% by Myoview 9/19   Dementia (HCC)    Essential tremor    Hyperlipidemia    Intolerant of statins   Hypertension    PAF (paroxysmal atrial fibrillation) ([place])    Pancytopenia ([place])    Follows with hematology   Prostate cancer ([place])    Seizure ([place])    Past Surgical History:  Procedure Laterality Date   AORTIC VALVE REPLACEMENT     CARDIOVERSION     CATARACT EXTRACTION Bilateral    HERNIA REPAIR     Social History:  reports that he has never smoked. He quit smokeless tobacco use about 4 years ago.  His smokeless tobacco use included chew. He reports that he does not currently use alcohol. No history on file for drug use.  Allergies  Allergen Reactions   Penicillins Rash    Family History  Problem Relation Age of  Onset   Coronary artery disease Mother    Coronary artery disease Father    Lung cancer Child    Drug abuse Child     Prior to Admission medications   Medication Sig Start Date End Date Taking? Authorizing Provider  albuterol (VENTOLIN HFA) 108 (90 Base) MCG/ACT inhaler Inhale 1-2 puffs into the lungs every 4 (four) hours as needed for wheezing or shortness of breath. 08/21/21   Shelly Coss, MD  amiodarone  (PACERONE) 200 MG tablet Take 1/2 (one-half) tablet by mouth once daily 09/30/21   Buford Dresser, MD  amoxicillin-clavulanate (AUGMENTIN) 875-125 MG tablet Take 1 tablet by mouth 2 (two) times daily. 12/18/21   Tempie Hoist, FNP  aspirin EC 81 MG tablet Take 1 tablet (81 mg total) by mouth daily with breakfast. 10/21/18   Denton Brick, Courage, MD  fluticasone (FLONASE) 50 MCG/ACT nasal spray Place 1 spray into both nostrils daily.    [provider]  guaiFENesin-dextromethorphan (ROBITUSSIN DM) 100-10 MG/5ML syrup Take 5 mLs by mouth every 4 (four) hours as needed for cough. 08/21/21   Shelly Coss, MD  ipratropium-albuterol (DUONEB) 0.5-2.5 (3) MG/3ML SOLN Take 3 mLs by nebulization every 6 (six) hours as needed. 08/21/21   Shelly Coss, MD  loratadine (CLARITIN) 10 MG tablet Take 1 tablet (10 mg total) by mouth daily for 7 days. 08/21/21 08/28/21  Shelly Coss, MD  rosuvastatin (CRESTOR) 20 MG tablet Take 1 tablet (20 mg total) by mouth every evening. 10/21/18 10/03/21  Roxan Hockey, MD  sodium chloride (OCEAN) 0.65 % SOLN nasal spray Place 1 spray into both nostrils as needed for congestion. 07/17/19   Darr, Edison Nasuti, PA-C  SYMBICORT 80-4.5 MCG/ACT inhaler Inhale 2 puffs by mouth twice daily 10/22/19   Mannam, Hart Robinsons, MD  tamsulosin (FLOMAX) 0.4 MG CAPS capsule Take 0.4 mg by mouth daily after supper.     [provider]  warfarin (COUMADIN) 2.5 MG tablet Take 0.5-1 tablet daily or as prescribed by [facility] Clinic Patient taking differently: Take 1.25-2.5 mg by mouth daily. 2.5 mg on Sunday, Tuesday, Wednesday, Thursday, Saturday 1.25 mg on Monday, Friday 04/23/20   Buford Dresser, MD    Physical Exam: Vitals:   04/12/22 0333  BP: (!) 154/77  Pulse: 61  Resp: 18  Temp: 99 F (37.2 C)  TempSrc: Oral  SpO2: 90%  Weight: 70.3 kg  Height: 5' (1.524 m)   Physical Exam Vitals and nursing note reviewed.  Constitutional:      Appearance: Normal appearance.   HENT:     Head: Normocephalic.     Nose: No rhinorrhea.     Mouth/Throat:     Mouth: Mucous membranes are moist.  Eyes:     General: No scleral icterus.    Pupils: Pupils are equal, round, and reactive to light.  Cardiovascular:     Rate and Rhythm: Normal rate and regular rhythm.  Pulmonary:     Effort: Pulmonary effort is normal.     Breath sounds: Normal breath sounds.  Abdominal:     General: Bowel sounds are normal. There is no distension.     Palpations: Abdomen is soft.     Tenderness: There is no abdominal tenderness. There is no right CVA tenderness or left CVA tenderness.  Genitourinary:    Testes: Normal.  Musculoskeletal:     Cervical back: Neck supple.     Right lower leg: No edema.     Left lower leg: No edema.  Skin:    General: Skin  is warm and dry.     Findings: Ecchymosis present.     Comments: Multiple areas of ecchymosis particularly on the right upper extremities  Neurological:     General: No focal deficit present.     Mental Status: He is alert. Mental status is at baseline.  Psychiatric:        Mood and Affect: Mood normal.    Data Reviewed:  Results are pending, will review when available.  11/08/2018 echocardiogram IMPRESSIONS:   1. The left ventricle has normal systolic function with an ejection  fraction of 60-65%. The cavity size was normal. There is mildly increased  left ventricular wall thickness with severe basal septal hypertrophy.  There is apical and apical anterior  akinesis.   2. The right ventricle has normal systolic function. The cavity was  mildly enlarged. There is no increase in right ventricular wall thickness.   3. A Mechanical AVR valve is present in the aortic position. Procedure  Date: 1995 Normal aortic valve prosthesis. The prosthesis is not well  visualized. The mean AVG is 42mHg. There is trivial perivalvular AI.   4. There is mild to moderate mitral annular calcification present and  mild MR.   5. There is  mild dilatation of the ascending aorta measuring 40 mm.   6. Right atrial size was mildly dilated.   7. Left atrial size was moderately dilated.   Assessment and Plan: Principal Problem:   Hematuria In the setting of:   Supratherapeutic INR No significant bleeding at the moment. He has been seen by urology. Cephalexin started for UTI prevention. Will defer FFP at the moment. Vitamin K 3 mg SQ x 1. Monitor INR daily. Holding warfarin today. Hold low-dose aspirin. Consult pharmacy for warfarin dosing. Continue tamsulosin 0.4 mg p.o. daily. Begin Myrbetriq 25 mg p.o. daily. Begin cephalexin 500 mg p.o. 3 times daily. Continue Foley catheter per urology. Urology consult appreciated.  Active Problems:   Normocytic anemia With ongoing greed of ABLA. Monitor hematocrit and hemoglobin. Transfuse as needed.    Atrial fibrillation ([place] In the setting of:   History of mechanical aortic valve replacement CHA2DS2-VASc Score of at least 5. Warfarin per pharmacy. Continue amiodarone 200 mg p.o. daily.    Coronary artery disease Holding aspirin and warfarin. Continue rosuvastatin 20 mg p.o. daily.    Chronic systolic heart failure (HCC) EF has normalized. No signs of decompensation.    Hyperlipidemia Continue rosuvastatin 20 mg p.o. daily.    Essential tremor At baseline. Consider trial of primidone if he worsens.    Hypertension Hydralazine p.o. as needed. Continue blood pressure monitoring.    Dementia ([facility].    Hyponatremia This is chronic. At baseline. Monitor sodium level.    Thrombocytopenia (HCC) Holding aspirin at the moment. Monitor platelet count.    OSA on CPAP Continue CPAP at bedtime.    Stage 3b chronic kidney disease (CKD) (HCC) Monitor renal function and electrolytes.      Advance Care Planning:   Code Status: Full Code   Consults:   Family Communication: Her son was at bedside.  Severity of Illness: The appropriate  patient status for this patient is INPATIENT. Inpatient status is judged to be reasonable and necessary in order to provide the required intensity of service to ensure the patient's safety. The patient's presenting symptoms, physical exam findings, and initial radiographic and laboratory data in the context of their chronic comorbidities is felt to place them at high risk for further clinical deterioration.  Furthermore, it is not anticipated that the patient will be medically stable for discharge from the hospital within 2 midnights of admission.   * I certify that at the point of admission it is my clinical judgment that the patient will require inpatient hospital care spanning beyond 2 midnights from the point of admission due to high intensity of service, high risk for further deterioration and high frequency of surveillance required.*  Author: Reubin Milan, MD 04/12/2022 7:14 AM  For on call review [email or web address].   This document was prepared using Dragon voice recognition software and may contain some unintended transcription errors.

## 2022-04-12 NOTE — ED Notes (Signed)
ED TO INPATIENT HANDOFF REPORT  ED Nurse Name and Phone #: Tonette Bihari [phone]  S Name/Age/Gender Tanner Ferguson 89 y.o. male Room/Bed: WA14/WA14  Code Status   Code Status: Prior  Home/SNF/Other Home Patient oriented to: Dementia Is this baseline? Yes   Triage Complete: Triage complete  Chief Complaint Complicated UTI (urinary tract infection) [N39.0]  Triage Note Pt has a 29F foley placed yesterday at [facility]. He is reporting severe pain in his urethra at the insertion site. Also reporting burning. Catheter is draining.    Allergies Allergies  Allergen Reactions   Penicillins Rash    Level of Care/Admitting Diagnosis ED Disposition     ED Disposition  Admit   Condition  --   Comment  Hospital Area: [place] [[id number]]  Level of Care: Progressive [102]  Admit to Progressive based on following criteria: COMPLICATED UROLOGY Patients requiring frequent assessments and interventions, such as continuous bladder irrigations, immediate post-op surgical procedures, i.e., bladder removal/ileal conduit.  May admit patient to [facility] or [facility] if equivalent level of care is available:: Yes  Covid Evaluation: Asymptomatic - no recent exposure (last 10 days) testing not required  Diagnosis: Complicated UTI (urinary tract infection) [id number]  Admitting Physician: Kayleen Memos [id number]  Attending Physician: Kayleen Memos [id number]  Certification:: I certify this patient will need inpatient services for at least 2 midnights  Estimated Length of Stay: 2          B Medical/Surgery History Past Medical History:  Diagnosis Date   Asthma    CAD (coronary artery disease)    s/p NSTEMI 9/19. Cath in [place], [place] 10/19. Severe CAD not amenable to PCI. Not CABG candidate   CHF (congestive heart failure) (HCC)    EF 40-45% by echo due to iCM. EF 36% by Myoview 9/19   Dementia (HCC)    Essential tremor    Hyperlipidemia    Intolerant of statins    Hypertension    PAF (paroxysmal atrial fibrillation) (HCC)    Pancytopenia (HCC)    Follows with hematology   Prostate cancer ([place])    Seizure ([place])    Past Surgical History:  Procedure Laterality Date   AORTIC VALVE REPLACEMENT     CARDIOVERSION     CATARACT EXTRACTION Bilateral    HERNIA REPAIR       A IV Location/Drains/Wounds Patient Lines/Drains/Airways Status     Active Line/Drains/Airways     Name Placement date Placement time Site Days   Peripheral IV 04/12/22 20 G Left Antecubital 04/12/22  0437  Antecubital  less than 1   Urethral Catheter mss Triple-lumen 22 Fr. 04/11/22  1420  Triple-lumen  1            Intake/Output Last 24 hours No intake or output data in the 24 hours ending 04/12/22 [id number]  Labs/Imaging Results for orders placed or performed during the hospital encounter of 04/12/22 (from the past 48 hour(s))  Urinalysis, Routine w reflex microscopic -Urine, Unspecified Source     Status: Abnormal   Collection Time: 04/12/22  3:45 AM  Result Value Ref Range   Color, Urine YELLOW YELLOW   APPearance CLOUDY (A) CLEAR   Specific Gravity, Urine 1.014 1.005 - 1.030   pH 7.0 5.0 - 8.0   Glucose, UA NEGATIVE NEGATIVE mg/dL   Hgb urine dipstick LARGE (A) NEGATIVE   Bilirubin Urine NEGATIVE NEGATIVE   Ketones, ur NEGATIVE NEGATIVE mg/dL   Protein, ur 100 (A) NEGATIVE mg/dL   Nitrite  NEGATIVE NEGATIVE   Leukocytes,Ua MODERATE (A) NEGATIVE   RBC / HPF >50 0 - 5 RBC/hpf   WBC, UA >50 0 - 5 WBC/hpf   Bacteria, UA RARE (A) NONE SEEN   Squamous Epithelial / HPF 0-5 0 - 5 /HPF   WBC Clumps PRESENT    Mucus PRESENT     Comment: Performed at [facility], [place] [address] [place], [place] [address]  CBC with Differential     Status: Abnormal   Collection Time: 04/12/22  4:55 AM  Result Value Ref Range   WBC 5.6 4.0 - 10.5 K/uL   RBC 3.80 (L) 4.22 - 5.81 MIL/uL   Hemoglobin 11.4 (L) 13.0 - 17.0 g/dL   HCT 35.3 (L) 39.0 - 52.0 %   MCV 92.9  80.0 - 100.0 fL   MCH 30.0 26.0 - 34.0 pg   MCHC 32.3 30.0 - 36.0 g/dL   RDW 14.6 11.5 - 15.5 %   Platelets 112 (L) 150 - 400 K/uL   nRBC 0.0 0.0 - 0.2 %   Neutrophils Relative % 85 %   Neutro Abs 4.7 1.7 - 7.7 K/uL   Lymphocytes Relative 8 %   Lymphs Abs 0.4 (L) 0.7 - 4.0 K/uL   Monocytes Relative 7 %   Monocytes Absolute 0.4 0.1 - 1.0 K/uL   Eosinophils Relative 0 %   Eosinophils Absolute 0.0 0.0 - 0.5 K/uL   Basophils Relative 0 %   Basophils Absolute 0.0 0.0 - 0.1 K/uL   Immature Granulocytes 0 %   Abs Immature Granulocytes 0.01 0.00 - 0.07 K/uL    Comment: Performed at [facility], [place] [address], [place], [place] [id number]  Basic metabolic panel     Status: Abnormal   Collection Time: 04/12/22  4:55 AM  Result Value Ref Range   Sodium 130 (L) 135 - 145 mmol/L   Potassium 4.0 3.5 - 5.1 mmol/L   Chloride 101 98 - 111 mmol/L   CO2 22 22 - 32 mmol/L   Glucose, Bld 112 (H) 70 - 99 mg/dL    Comment: Glucose reference range applies only to samples taken after fasting for at least 8 hours.   BUN 27 (H) 8 - 23 mg/dL   Creatinine, Ser 1.45 (H) 0.61 - 1.24 mg/dL   Calcium 8.8 (L) 8.9 - 10.3 mg/dL   GFR, Estimated 45 (L) >60 mL/min    Comment: (NOTE) Calculated using the CKD-EPI Creatinine Equation (2021)    Anion gap 7 5 - 15    Comment: Performed at [facility], [place] [address], [place], [place] [address]  Protime-INR     Status: Abnormal   Collection Time: 04/12/22  5:05 AM  Result Value Ref Range   Prothrombin Time 39.9 (H) 11.4 - 15.2 seconds   INR 4.2 (HH) 0.8 - 1.2    Comment: REPEATED TO VERIFY CRITICAL RESULT CALLED TO, READ BACK BY AND VERIFIED WITH: CHELSEA,S AT 0600 ON 04/12/22 BY LUZOLOP (NOTE) INR goal varies based on device and disease states. Performed at [facility], [place] [address] [place], [place] [address]    CT Renal Stone Study  Result Date: 04/11/2022 CLINICAL DATA:  Abdominal pain, gross  hematuria EXAM: CT ABDOMEN AND PELVIS WITHOUT CONTRAST TECHNIQUE: Multidetector CT imaging of the abdomen and pelvis was performed following the standard protocol without IV contrast. RADIATION DOSE REDUCTION: This exam was performed according to the departmental dose-optimization program which includes automated exposure control, adjustment  of the mA and/or kV according to patient size and/or use of iterative reconstruction technique. COMPARISON:  None Available. FINDINGS: Lower chest: No acute abnormality. Bilateral calcified pleural plaques along the diaphragmatic surface. Hepatobiliary: No focal liver abnormality is seen. No gallstones, gallbladder wall thickening, or biliary dilatation. Pancreas: Unremarkable. No pancreatic ductal dilatation or surrounding inflammatory changes. Spleen: Normal in size without focal abnormality. Adrenals/Urinary Tract: Adrenal glands are unremarkable. Kidneys are normal, without renal calculi, focal lesion, or hydronephrosis. Bladder is unremarkable. Stomach/Bowel: Stomach is within normal limits. No evidence of bowel wall thickening, distention, or inflammatory changes. Moderate amount of stool in the ascending and transverse colon. Diverticulosis without evidence of diverticulitis. Haziness in the mesenteric fat as can be seen with mesenteric panniculitis or can be idiopathic. Vascular/Lymphatic: Aortic atherosclerosis. No enlarged abdominal or pelvic lymph nodes. Reproductive: Prostate is unremarkable. Prostatic radiation seeds noted. Other: No abdominal wall hernia or abnormality. No abdominopelvic ascites. Musculoskeletal: No acute osseous abnormality. No aggressive osseous lesion. Degenerative disease with disc height loss at L1-2, L4-5 and L5-S1. Bilateral facet arthropathy throughout the lumbar spine. Levoscoliosis of the lumbar spine. IMPRESSION: 1. No acute abdominal or pelvic pathology. 2. Diverticulosis without evidence of diverticulitis. 3. Haziness in the mesenteric  fat as can be seen with mesenteric panniculitis or can be idiopathic. 4. Bilateral calcified pleural plaques along the diaphragmatic surface consistent with prior asbestos exposure. 5.  Aortic Atherosclerosis (ICD10-I70.0). Electronically Signed   By: Kathreen Devoid M.D.   On: 04/11/2022 13:05    Pending Labs Unresulted Labs (From admission, onward)    None       Vitals/Pain Today's Vitals   04/12/22 0333 04/12/22 0335  BP: (!) 154/77   Pulse: 61   Resp: 18   Temp: 99 F (37.2 C)   TempSrc: Oral   SpO2: 90%   Weight: 70.3 kg   Height: 5' (1.524 m)   PainSc:  8     Isolation Precautions No active isolations  Medications Medications  fentaNYL (SUBLIMAZE) injection 25 mcg (25 mcg Intravenous Given 04/12/22 0615)  fentaNYL (SUBLIMAZE) injection 25 mcg (25 mcg Intravenous Given 04/12/22 0438)    Mobility manual wheelchair     Focused Assessments    R Recommendations: See Admitting Provider Note  Report given to:   Additional Notes:

## 2022-04-12 NOTE — Consult Note (Signed)
Urology Consult Note   Requesting Attending Physician:  Reubin Milan, MD Service Providing Consult: Urology  Consulting Attending: Dr. Felipa Eth   Reason for Consult:  Hematuria, dysuria  HPI: Tanner Ferguson is seen in consultation for reasons noted above at the request of Reubin Milan, MD for evaluation of hematuria. .  This is a 89 y.o. male with history of dementia, CHF, prostate cancer s/p brachytherapy (10 + years ago) and prior valve replacement on coumadin first presenting to ED 2/11 with hematuria. Was having difficulty urinating and so a catheter was placed. Reportedly some small clots expressed. UA at that time low concern for infection. He was discharged home with a well draining catheter in place and instructed to follow-up with urology. 2/12 he re-presented with catheter related pain. Catheter draining freely, thin amber urine. INR 4.2. UA now with pyruia. Hgb stable. CT 2/11 without stones/masses and distended bladder without evidence of formed clot.   Past Medical History: Past Medical History:  Diagnosis Date   Asthma    CAD (coronary artery disease)    s/p NSTEMI 9/19. Cath in [place], [place] 10/19. Severe CAD not amenable to PCI. Not CABG candidate   CHF (congestive heart failure) (HCC)    EF 40-45% by echo due to iCM. EF 36% by Myoview 9/19   Dementia (HCC)    Essential tremor    Hyperlipidemia    Intolerant of statins   Hypertension    PAF (paroxysmal atrial fibrillation) ([place])    Pancytopenia (HCC)    Follows with hematology   Prostate cancer ([place])    Seizure ([place])     Past Surgical History:  Past Surgical History:  Procedure Laterality Date   AORTIC VALVE REPLACEMENT     CARDIOVERSION     CATARACT EXTRACTION Bilateral    HERNIA REPAIR      Medication: Current Facility-Administered Medications  Medication Dose Route Frequency Provider Last Rate Last Admin   acetaminophen (TYLENOL) tablet 650 mg  650 mg Oral Q6H PRN Reubin Milan, MD        Or   acetaminophen (TYLENOL) suppository 650 mg  650 mg Rectal Q6H PRN Reubin Milan, MD       fentaNYL (SUBLIMAZE) injection 25 mcg  25 mcg Intravenous Q2H PRN Molpus, John, MD   25 mcg at 04/12/22 0615   ondansetron (ZOFRAN) tablet 4 mg  4 mg Oral Q6H PRN Reubin Milan, MD       Or   ondansetron [facility]) injection 4 mg  4 mg Intravenous Q6H PRN Reubin Milan, MD       Current Outpatient Medications  Medication Sig Dispense Refill   albuterol (VENTOLIN HFA) 108 (90 Base) MCG/ACT inhaler Inhale 1-2 puffs into the lungs every 4 (four) hours as needed for wheezing or shortness of breath. 8 g 1   amiodarone (PACERONE) 200 MG tablet Take 1/2 (one-half) tablet by mouth once daily (Patient taking differently: Take 100 mg by mouth daily.) 45 tablet 3   aspirin EC 81 MG tablet Take 1 tablet (81 mg total) by mouth daily with breakfast. 30 tablet 1   cholecalciferol (VITAMIN D3) 25 MCG (1000 UNIT) tablet Take 1,000 Units by mouth daily.     fluticasone (FLONASE) 50 MCG/ACT nasal spray Place 1 spray into both nostrils daily.     guaiFENesin-dextromethorphan (ROBITUSSIN DM) 100-10 MG/5ML syrup Take 5 mLs by mouth every 4 (four) hours as needed for cough. 118 mL 0   ipratropium-albuterol (DUONEB) 0.5-2.5 (3)  MG/3ML SOLN Take 3 mLs by nebulization every 6 (six) hours as needed. 360 mL 0   rosuvastatin (CRESTOR) 20 MG tablet Take 1 tablet (20 mg total) by mouth every evening. 30 tablet 1   sodium chloride (OCEAN) 0.65 % SOLN nasal spray Place 1 spray into both nostrils as needed for congestion. 44 mL 0   SYMBICORT 80-4.5 MCG/ACT inhaler Inhale 2 puffs by mouth twice daily 10.2 g 0   tamsulosin (FLOMAX) 0.4 MG CAPS capsule Take 0.4 mg by mouth daily after supper.      warfarin (COUMADIN) 2.5 MG tablet Take 0.5-1 tablet daily or as prescribed by [facility] (Patient taking differently: Take 1.25-2.5 mg by mouth daily. 2.5 mg on Sunday, Tuesday,Thursday,  1.25 mg on Monday, Wednesday,  Friday & Saturday) 90 tablet 1    Allergies: Allergies  Allergen Reactions   Penicillins Rash    Social History: Social History   Tobacco Use   Smoking status: Never   Smokeless tobacco: Former    Types: Chew    Quit date: 10/25/2017  Vaping Use   Vaping Use: Never used  Substance Use Topics   Alcohol use: Not Currently    Family History Family History  Problem Relation Age of Onset   Coronary artery disease Mother    Coronary artery disease Father    Lung cancer Child    Drug abuse Child     Review of Systems 10 systems were reviewed and are negative except as noted specifically in the HPI.  Objective   Vital signs in last 24 hours: BP (!) 154/77 (BP Location: Right Arm)   Pulse 61   Temp 99 F (37.2 C) (Oral)   Resp 18   Ht 5' (1.524 m)   Wt 70.3 kg   SpO2 90%   BMI 30.27 kg/m   Physical Exam General: NAD, alert, oriented to self/situation HEENT: [place]/AT, EOMI, MMM Pulmonary: Normal work of breathing Cardiovascular: HDS, adequate peripheral perfusion Abdomen: Soft, NTTP, nondistended. GU: catheter in place draining thin amber urine, no CVA tenderness, no suprapubic tenderness Extremities: warm and well perfused Neuro: Appropriate, no focal neurological deficits  Most Recent Labs: Lab Results  Component Value Date   WBC 5.6 04/12/2022   HGB 11.4 (L) 04/12/2022   HCT 35.3 (L) 04/12/2022   PLT 112 (L) 04/12/2022    Lab Results  Component Value Date   NA 130 (L) 04/12/2022   K 4.0 04/12/2022   CL 101 04/12/2022   CO2 22 04/12/2022   BUN 27 (H) 04/12/2022   CREATININE 1.45 (H) 04/12/2022   CALCIUM 8.8 (L) 04/12/2022    Lab Results  Component Value Date   INR 4.2 (HH) 04/12/2022     Urine Culture: @[id number]$ (laburin,org,[id number])@   IMAGING: CT Renal Stone Study  Result Date: 04/11/2022 CLINICAL DATA:  Abdominal pain, gross hematuria EXAM: CT ABDOMEN AND PELVIS WITHOUT CONTRAST TECHNIQUE: Multidetector CT imaging of the abdomen  and pelvis was performed following the standard protocol without IV contrast. RADIATION DOSE REDUCTION: This exam was performed according to the departmental dose-optimization program which includes automated exposure control, adjustment of the mA and/or kV according to patient size and/or use of iterative reconstruction technique. COMPARISON:  None Available. FINDINGS: Lower chest: No acute abnormality. Bilateral calcified pleural plaques along the diaphragmatic surface. Hepatobiliary: No focal liver abnormality is seen. No gallstones, gallbladder wall thickening, or biliary dilatation. Pancreas: Unremarkable. No pancreatic ductal dilatation or surrounding inflammatory changes. Spleen: Normal in size without focal abnormality. Adrenals/Urinary Tract: Adrenal  glands are unremarkable. Kidneys are normal, without renal calculi, focal lesion, or hydronephrosis. Bladder is unremarkable. Stomach/Bowel: Stomach is within normal limits. No evidence of bowel wall thickening, distention, or inflammatory changes. Moderate amount of stool in the ascending and transverse colon. Diverticulosis without evidence of diverticulitis. Haziness in the mesenteric fat as can be seen with mesenteric panniculitis or can be idiopathic. Vascular/Lymphatic: Aortic atherosclerosis. No enlarged abdominal or pelvic lymph nodes. Reproductive: Prostate is unremarkable. Prostatic radiation seeds noted. Other: No abdominal wall hernia or abnormality. No abdominopelvic ascites. Musculoskeletal: No acute osseous abnormality. No aggressive osseous lesion. Degenerative disease with disc height loss at L1-2, L4-5 and L5-S1. Bilateral facet arthropathy throughout the lumbar spine. Levoscoliosis of the lumbar spine. IMPRESSION: 1. No acute abdominal or pelvic pathology. 2. Diverticulosis without evidence of diverticulitis. 3. Haziness in the mesenteric fat as can be seen with mesenteric panniculitis or can be idiopathic. 4. Bilateral calcified pleural  plaques along the diaphragmatic surface consistent with prior asbestos exposure. 5.  Aortic Atherosclerosis (ICD10-I70.0). Electronically Signed   By: Kathreen Devoid M.D.   On: 04/11/2022 13:05    ------  Assessment:  89 y.o. male with hematuria and supratherapeutic INR. Unclear etiology of initial hematuria. Discussed with them possibilities including prior radiation, infection, stones, malignancy. No masses/stones on CT scan from 2/11.   Recommendations: - Would recommend follow-up with warfarin clinic for supratherapeutic INR - Would START myrbetriq 52m daily for bladder spasms. With his age and cognitive decline, anticholinergics would be a poor option for him. - Will need follow-up in urology clinic for trial of void in 7-10 days - Given age, symptoms, and equivocal UA, would be reasonable to treat empirically for UTI with 7days cephalexin.   Thank you for this consult. Please contact the urology consult pager with any further questions/concerns.  Patient seen and examined.  History as above reviewed with patient's son and daughter. Urine in foley tubing clear without clots.  Foley draining well.   He is not having any significant discomfort with catheter at the present time. Agree with plans for urine culture and empiric antibiotics with cephalexin x 7 days. Continue foley x 1 week Myrbetriq 25 mg daily and Pyridium 200 mg TID prn bladder discomfort. [facility] May need further evaluation with cystoscopy as outpatient.  BPrimus Bravo MD [phone]

## 2022-04-12 NOTE — ED Provider Notes (Signed)
[place] DEPT Provider Note: Georgena Spurling, MD, FACEP  CSN: [id number] MRN: [id number] ARRIVAL: 04/12/22 at [place]: Tanner  Ferguson Catheter Complications  Level 5 caveat: Dementia HISTORY OF PRESENT ILLNESS  04/12/22 4:03 AM Tanner Ferguson is a 89 y.o. male who had a 34 French Ferguson catheter placed at [facility] yesterday for 5 days of gross hematuria (his urine has been dark for several days before that but it was not obviously bloody) which worsened over the past 2 days with passage of clots and ultimately urinary retention.  He returns to the ED now with severe pain in his urethra at the site of the Ferguson catheter.  He describes this as a burning pain.  His catheter is draining well although the urine is blood-tinged.  He rates the pain in his urethra as an 8 out of 10.  His family states he has had general decline recently with less activity and energy.  They did not know if this is because of blood loss.  He has not had a fever to their knowledge.   Past Medical History:  Diagnosis Date   Asthma    CAD (coronary artery disease)    s/p NSTEMI 9/19. Cath in [place], [place] 10/19. Severe CAD not amenable to PCI. Not CABG candidate   CHF (congestive heart failure) (HCC)    EF 40-45% by echo due to iCM. EF 36% by Myoview 9/19   Dementia ([place])    Essential tremor    Hyperlipidemia    Intolerant of statins   Hypertension    PAF (paroxysmal atrial fibrillation) ([place])    Pancytopenia ([place])    Follows with hematology   Prostate cancer ([place])    Seizure ([place])     Past Surgical History:  Procedure Laterality Date   AORTIC VALVE REPLACEMENT     CARDIOVERSION     CATARACT EXTRACTION Bilateral    HERNIA REPAIR      Family History  Problem Relation Age of Onset   Coronary artery disease Mother    Coronary artery disease Father    Lung cancer Child    Drug abuse Child     Social History   Tobacco Use   Smoking status: Never   Smokeless  tobacco: Former    Types: Chew    Quit date: 10/25/2017  Vaping Use   Vaping Use: Never used  Substance Use Topics   Alcohol use: Not Currently    Prior to Admission medications   Medication Sig Start Date End Date Taking? Authorizing Provider  albuterol (VENTOLIN HFA) 108 (90 Base) MCG/ACT inhaler Inhale 1-2 puffs into the lungs every 4 (four) hours as needed for wheezing or shortness of breath. 08/21/21   Shelly Coss, MD  amiodarone (PACERONE) 200 MG tablet Take 1/2 (one-half) tablet by mouth once daily 09/30/21   Buford Dresser, MD  amoxicillin-clavulanate (AUGMENTIN) 875-125 MG tablet Take 1 tablet by mouth 2 (two) times daily. 12/18/21   Tempie Hoist, FNP  aspirin EC 81 MG tablet Take 1 tablet (81 mg total) by mouth daily with breakfast. 10/21/18   [place] Brick, Courage, MD  fluticasone (FLONASE) 50 MCG/ACT nasal spray Place 1 spray into both nostrils daily.    [provider]  guaiFENesin-dextromethorphan (ROBITUSSIN DM) 100-10 MG/5ML syrup Take 5 mLs by mouth every 4 (four) hours as needed for cough. 08/21/21   Shelly Coss, MD  ipratropium-albuterol (DUONEB) 0.5-2.5 (3) MG/3ML SOLN Take 3 mLs by nebulization every 6 (six) hours  as needed. 08/21/21   Shelly Coss, MD  loratadine (CLARITIN) 10 MG tablet Take 1 tablet (10 mg total) by mouth daily for 7 days. 08/21/21 08/28/21  Shelly Coss, MD  rosuvastatin (CRESTOR) 20 MG tablet Take 1 tablet (20 mg total) by mouth every evening. 10/21/18 10/03/21  Roxan Hockey, MD  sodium chloride (OCEAN) 0.65 % SOLN nasal spray Place 1 spray into both nostrils as needed for congestion. 07/17/19   Darr, Edison Nasuti, PA-C  SYMBICORT 80-4.5 MCG/ACT inhaler Inhale 2 puffs by mouth twice daily 10/22/19   Mannam, Hart Robinsons, MD  tamsulosin (FLOMAX) 0.4 MG CAPS capsule Take 0.4 mg by mouth daily after supper.     [provider]  warfarin (COUMADIN) 2.5 MG tablet Take 0.5-1 tablet daily or as prescribed by [facility] Patient taking  differently: Take 1.25-2.5 mg by mouth daily. 2.5 mg on Sunday, Tuesday, Wednesday, Thursday, Saturday 1.25 mg on Monday, Friday 04/23/20   Buford Dresser, MD    Allergies Penicillins   REVIEW OF SYSTEMS  5 caveat   PHYSICAL EXAMINATION  Initial Vital Signs Blood pressure (!) 154/77, pulse 61, temperature 99 F (37.2 C), temperature source Oral, resp. rate 18, height 5' (1.524 m), weight 70.3 kg, SpO2 90 %.  Examination General: Well-developed, well-nourished male in no acute distress; appearance consistent with age of record HENT: normocephalic; atraumatic Eyes: Normal appearance Neck: supple Heart: regular rate and rhythm Lungs: clear to auscultation bilaterally Abdomen: soft; nondistended; nontender; bowel sounds present GU: Tanner V male, circumcised; Ferguson catheter in place draining blood-tinged urine, small amount of urine surrounding the Ferguson at the urethral meatus, no laceration or erosion seen at the urethral meatus Extremities: Arthritic changes Neurologic: Awake, alert; motor function intact in all extremities and symmetric; resting tremor primarily of the left hand Skin: Warm and dry Psychiatric: Flat affect   RESULTS  Summary of this visit's results, reviewed and interpreted by myself:   EKG Interpretation  Date/Time:    Ventricular Rate:    PR Interval:    QRS Duration:   QT Interval:    QTC Calculation:   R Axis:     Text Interpretation:         Laboratory Studies: Results for orders placed or performed during the hospital encounter of 04/12/22 (from the past 24 hour(s))  Urinalysis, Routine w reflex microscopic -Urine, Unspecified Source     Status: Abnormal   Collection Time: 04/12/22  3:45 AM  Result Value Ref Range   Color, Urine YELLOW YELLOW   APPearance CLOUDY (A) CLEAR   Specific Gravity, Urine 1.014 1.005 - 1.030   pH 7.0 5.0 - 8.0   Glucose, UA NEGATIVE NEGATIVE mg/dL   Hgb urine dipstick LARGE (A) NEGATIVE   Bilirubin Urine  NEGATIVE NEGATIVE   Ketones, ur NEGATIVE NEGATIVE mg/dL   Protein, ur 100 (A) NEGATIVE mg/dL   Nitrite NEGATIVE NEGATIVE   Leukocytes,Ua MODERATE (A) NEGATIVE   RBC / HPF >50 0 - 5 RBC/hpf   WBC, UA >50 0 - 5 WBC/hpf   Bacteria, UA RARE (A) NONE SEEN   Squamous Epithelial / HPF 0-5 0 - 5 /HPF   WBC Clumps PRESENT    Mucus PRESENT   CBC with Differential     Status: Abnormal   Collection Time: 04/12/22  4:55 AM  Result Value Ref Range   WBC 5.6 4.0 - 10.5 K/uL   RBC 3.80 (L) 4.22 - 5.81 MIL/uL   Hemoglobin 11.4 (L) 13.0 - 17.0 g/dL   HCT 35.3 (  L) 39.0 - 52.0 %   MCV 92.9 80.0 - 100.0 fL   MCH 30.0 26.0 - 34.0 pg   MCHC 32.3 30.0 - 36.0 g/dL   RDW 14.6 11.5 - 15.5 %   Platelets 112 (L) 150 - 400 K/uL   nRBC 0.0 0.0 - 0.2 %   Neutrophils Relative % 85 %   Neutro Abs 4.7 1.7 - 7.7 K/uL   Lymphocytes Relative 8 %   Lymphs Abs 0.4 (L) 0.7 - 4.0 K/uL   Monocytes Relative 7 %   Monocytes Absolute 0.4 0.1 - 1.0 K/uL   Eosinophils Relative 0 %   Eosinophils Absolute 0.0 0.0 - 0.5 K/uL   Basophils Relative 0 %   Basophils Absolute 0.0 0.0 - 0.1 K/uL   Immature Granulocytes 0 %   Abs Immature Granulocytes 0.01 0.00 - 0.07 K/uL  Basic metabolic panel     Status: Abnormal   Collection Time: 04/12/22  4:55 AM  Result Value Ref Range   Sodium 130 (L) 135 - 145 mmol/L   Potassium 4.0 3.5 - 5.1 mmol/L   Chloride 101 98 - 111 mmol/L   CO2 22 22 - 32 mmol/L   Glucose, Bld 112 (H) 70 - 99 mg/dL   BUN 27 (H) 8 - 23 mg/dL   Creatinine, Ser 1.45 (H) 0.61 - 1.24 mg/dL   Calcium 8.8 (L) 8.9 - 10.3 mg/dL   GFR, Estimated 45 (L) >60 mL/min   Anion gap 7 5 - 15   Imaging Studies: CT Renal Stone Study  Result Date: 04/11/2022 CLINICAL DATA:  Abdominal pain, gross hematuria EXAM: CT ABDOMEN AND PELVIS WITHOUT CONTRAST TECHNIQUE: Multidetector CT imaging of the abdomen and pelvis was performed following the standard protocol without IV contrast. RADIATION DOSE REDUCTION: This exam was performed  according to the departmental dose-optimization program which includes automated exposure control, adjustment of the mA and/or kV according to patient size and/or use of iterative reconstruction technique. COMPARISON:  None Available. FINDINGS: Lower chest: No acute abnormality. Bilateral calcified pleural plaques along the diaphragmatic surface. Hepatobiliary: No focal liver abnormality is seen. No gallstones, gallbladder wall thickening, or biliary dilatation. Pancreas: Unremarkable. No pancreatic ductal dilatation or surrounding inflammatory changes. Spleen: Normal in size without focal abnormality. Adrenals/Urinary Tract: Adrenal glands are unremarkable. Kidneys are normal, without renal calculi, focal lesion, or hydronephrosis. Bladder is unremarkable. Stomach/Bowel: Stomach is within normal limits. No evidence of bowel wall thickening, distention, or inflammatory changes. Moderate amount of stool in the ascending and transverse colon. Diverticulosis without evidence of diverticulitis. Haziness in the mesenteric fat as can be seen with mesenteric panniculitis or can be idiopathic. Vascular/Lymphatic: Aortic atherosclerosis. No enlarged abdominal or pelvic lymph nodes. Reproductive: Prostate is unremarkable. Prostatic radiation seeds noted. Other: No abdominal wall hernia or abnormality. No abdominopelvic ascites. Musculoskeletal: No acute osseous abnormality. No aggressive osseous lesion. Degenerative disease with disc height loss at L1-2, L4-5 and L5-S1. Bilateral facet arthropathy throughout the lumbar spine. Levoscoliosis of the lumbar spine. IMPRESSION: 1. No acute abdominal or pelvic pathology. 2. Diverticulosis without evidence of diverticulitis. 3. Haziness in the mesenteric fat as can be seen with mesenteric panniculitis or can be idiopathic. 4. Bilateral calcified pleural plaques along the diaphragmatic surface consistent with prior asbestos exposure. 5.  Aortic Atherosclerosis (ICD10-I70.0).  Electronically Signed   By: Kathreen Devoid M.D.   On: 04/11/2022 13:05    ED COURSE and MDM  Nursing notes, initial and subsequent vitals signs, including pulse oximetry, reviewed and interpreted by myself.  Vitals:   04/12/22 0333  BP: (!) 154/77  Pulse: 61  Resp: 18  Temp: 99 F (37.2 C)  TempSrc: Oral  SpO2: 90%  Weight: 70.3 kg  Height: 5' (1.524 m)   Medications  fentaNYL (SUBLIMAZE) injection 25 mcg (has no administration in time range)  fentaNYL (SUBLIMAZE) injection 25 mcg (25 mcg Intravenous Given 04/12/22 0438)   5:40 AM The patient has no new gross abnormalities on his laboratory work, most of his abnormalities are consistent with prior studies.  Because he has had progressive weakness we will get him admitted to the hospitalist service for suspected failure to thrive.  I anticipate urology consultation for his hematuria and Ferguson catheter discomfort.  5:59 AM [facility] Crane accepts patient for admission to the hospitalist service.  PROCEDURES  Procedures   ED DIAGNOSES     ICD-10-CM   1. Generalized weakness  R53.1     2. Gross hematuria  R31.0     3. Problem with Ferguson catheter, initial encounter ([place])  T83.Warnell Forester, MD 04/12/22 [phone]

## 2022-04-12 NOTE — ED Triage Notes (Signed)
Pt has a 56F foley placed yesterday at [facility]. He is reporting severe pain in his urethra at the insertion site. Also reporting burning. Catheter is draining.

## 2022-04-12 NOTE — ED Notes (Signed)
Pt alert, NAD, calm, interactive, HOH, son at [facility], updated. Mentions urinary burning continues. Leg bag present.

## 2022-04-13 ENCOUNTER — Inpatient Hospital Stay (HOSPITAL_COMMUNITY): Payer: Medicare Other

## 2022-04-13 DIAGNOSIS — I5022 Chronic systolic (congestive) heart failure: Secondary | ICD-10-CM | POA: Diagnosis not present

## 2022-04-13 DIAGNOSIS — I4891 Unspecified atrial fibrillation: Secondary | ICD-10-CM | POA: Diagnosis not present

## 2022-04-13 DIAGNOSIS — R531 Weakness: Secondary | ICD-10-CM

## 2022-04-13 LAB — COMPREHENSIVE METABOLIC PANEL
ALT: 12 U/L (ref 0–44)
AST: 18 U/L (ref 15–41)
Albumin: 3.3 g/dL — ABNORMAL LOW (ref 3.5–5.0)
Alkaline Phosphatase: 41 U/L (ref 38–126)
Anion gap: 6 (ref 5–15)
BUN: 30 mg/dL — ABNORMAL HIGH (ref 8–23)
CO2: 22 mmol/L (ref 22–32)
Calcium: 8.7 mg/dL — ABNORMAL LOW (ref 8.9–10.3)
Chloride: 104 mmol/L (ref 98–111)
Creatinine, Ser: 1.68 mg/dL — ABNORMAL HIGH (ref 0.61–1.24)
GFR, Estimated: 38 mL/min — ABNORMAL LOW (ref >60)
Glucose, Bld: 101 mg/dL — ABNORMAL HIGH (ref 70–99)
Potassium: 4.5 mmol/L (ref 3.5–5.1)
Sodium: 132 mmol/L — ABNORMAL LOW (ref 135–145)
Total Bilirubin: 0.9 mg/dL (ref 0.3–1.2)
Total Protein: 6.8 g/dL (ref 6.5–8.1)

## 2022-04-13 LAB — ECHOCARDIOGRAM COMPLETE
AR max vel: 1.45 cm2
AV Area VTI: 1.61 cm2
AV Area mean vel: 1.39 cm2
AV Mean grad: 5.5 mmHg
AV Peak grad: 9.7 mmHg
Ao pk vel: 1.56 m/s
Area-P 1/2: 1.92 cm2
Calc EF: 52.9 %
Est EF: 55
Height: 60 in
MV M vel: 1.39 m/s
MV Peak grad: 7.8 mmHg
S' Lateral: 3.4 cm
Single Plane A2C EF: 53.1 %
Single Plane A4C EF: 53.8 %
Weight: 2480 oz

## 2022-04-13 LAB — URINE CULTURE: Culture: NO GROWTH

## 2022-04-13 LAB — CBC
HCT: 35 % — ABNORMAL LOW (ref 39.0–52.0)
Hemoglobin: 11.1 g/dL — ABNORMAL LOW (ref 13.0–17.0)
MCH: 29.9 pg (ref 26.0–34.0)
MCHC: 31.7 g/dL (ref 30.0–36.0)
MCV: 94.3 fL (ref 80.0–100.0)
Platelets: 99 10*3/uL — ABNORMAL LOW (ref 150–400)
RBC: 3.71 MIL/uL — ABNORMAL LOW (ref 4.22–5.81)
RDW: 14.7 % (ref 11.5–15.5)
WBC: 6.7 10*3/uL (ref 4.0–10.5)
nRBC: 0 % (ref 0.0–0.2)

## 2022-04-13 LAB — PROTIME-INR
INR: 2.6 — ABNORMAL HIGH (ref 0.8–1.2)
Prothrombin Time: 27.2 seconds — ABNORMAL HIGH (ref 11.4–15.2)

## 2022-04-13 MED ORDER — PERFLUTREN LIPID MICROSPHERE
1.0000 mL | INTRAVENOUS | Status: AC | PRN
  Administered 2022-04-13: 2 mL via INTRAVENOUS

## 2022-04-13 MED ORDER — WARFARIN SODIUM 2.5 MG PO TABS
2.5000 mg | ORAL_TABLET | Freq: Once | ORAL | Status: AC
  Administered 2022-04-13: 2.5 mg via ORAL
  Filled 2022-04-13: qty 1

## 2022-04-13 MED ORDER — LACTATED RINGERS IV SOLN: INTRAVENOUS | Status: DC

## 2022-04-13 MED ORDER — LACTATED RINGERS IV SOLN: INTRAVENOUS | Status: AC

## 2022-04-13 MED ORDER — ACETAMINOPHEN 500 MG PO TABS
500.0000 mg | ORAL_TABLET | Freq: Three times a day (TID) | ORAL | Status: DC
  Administered 2022-04-13 – 2022-04-18 (×15): 500 mg via ORAL
  Filled 2022-04-13 (×15): qty 1

## 2022-04-13 NOTE — Progress Notes (Signed)
ANTICOAGULATION CONSULT NOTE - Initial Consult  Pharmacy Consult for Warfarin dosing Indication: atrial fibrillation, mechanical aortic valve   Allergies  Allergen Reactions   Penicillins Rash    Patient Measurements: Height: 5' (152.4 cm) Weight: 70.3 kg (155 lb) IBW/kg (Calculated) : 50   Vital Signs: Temp: 98.6 F (37 C) (02/13 0450) Temp Source: Oral (02/13 0450) BP: 111/50 (02/13 0450) Pulse Rate: 49 (02/13 0450)  Labs: Recent Labs    04/12/22 0455 04/12/22 0505 04/12/22 1400 04/13/22 0409  HGB 11.4*  --  12.3* 11.1*  HCT 35.3*  --  38.8* 35.0*  PLT 112*  --   --  99*  LABPROT  --  39.9*  --  27.2*  INR  --  4.2*  --  2.6*  CREATININE 1.45*  --   --  1.68*     Estimated Creatinine Clearance: 23.1 mL/min (A) (by C-G formula based on SCr of 1.68 mg/dL (H)).   Medical History: Past Medical History:  Diagnosis Date   Asthma    CAD (coronary artery disease)    s/p NSTEMI 9/19. Cath in [place], [place] 10/19. Severe CAD not amenable to PCI. Not CABG candidate   CHF (congestive heart failure) (HCC)    EF 40-45% by echo due to iCM. EF 36% by Myoview 9/19   Dementia (HCC)    Essential tremor    Hyperlipidemia    Intolerant of statins   Hypertension    PAF (paroxysmal atrial fibrillation) ([place])    Pancytopenia ([place])    Follows with hematology   Prostate cancer ([place])    Seizure ([place])     Assessment:  Tanner Ferguson is a 89 y.o. male with history of dementia, CHF, history of pancytopenia, prostate cancer, seizure disorder, CAD, history NSTEMI, paroxysmal atrial fibrillation, mechanical aortic valve on warfarin who presented to the emergency department with complaints of Foley catheter malfunction and hematuria.   Goal of Therapy:  INR 2.5-3.5 Monitor platelets by anticoagulation protocol: Yes   Plan:  INR= 2.6 (at goal). Plts= 99, low Hgb=11.1, low but stable  Will resume warfarin at 2.5 mg x 1 dose today 2/13.  (Home dose). Monitor daily INR. Continue  to monitor Hgb and Plts.   Belanna Manring Pietro Cassis 04/13/2022,11:56 AM

## 2022-04-13 NOTE — Progress Notes (Addendum)
PROGRESS NOTE    Tanner Ferguson  [id number] DOB: 1927-07-07 DOA: 04/12/2022 PCP: Wenda Low, MD   Brief Narrative: 89 year old with past medical history significant for asthma, dementia, essential tremor, hyperlipidemia, hypertension, history of pancytopenia, prostate cancer, seizure disorder, CAD, history of non-STEMI, history of systolic heart failure ejection fraction 36% on Myoview stress test September 2019, recovered ejection fraction of 60% on 2020 by echo, paroxysmal A-fib, mechanical aortic valve on warfarin who presents to the ED complaining of Foley catheter malfunction and hematuria.  He was seen at [facility] 5 days ago for hematuria that subsequently resolved.  Now hematuria return.  UA with large hemoglobinuria, moderate leukocyte esterase, proteinuria, more than 50 red blood cells more than 50 white blood cells, rare bacteria.  INR 4.2, platelets 112.  CT renal protocol with no acute abdominal pelvic pathology.  Diverticulosis no diverticulitis.  Haziness of the mesenteric fat as can be seen with mesenteric panniculitis or can be idiopathic.  Bilateral calcified pleural plaques.     Assessment & Plan:   Principal Problem:   Hematuria Active Problems:   Atrial fibrillation (HCC)   Coronary artery disease   History of mechanical aortic valve replacement   Chronic systolic heart failure (HCC)   Hyperlipidemia   Essential tremor   Hypertension   Dementia (HCC)   Hyponatremia   Thrombocytopenia (HCC)   OSA on CPAP   Stage 3b chronic kidney disease (CKD) (HCC)   Normocytic anemia   Supratherapeutic INR  1-Hematuria in the setting of supratherapeutic INR,  Unclear etiology of initial hematuria possibilities prior radiation, infection, malignancy.  UA ;with more than 50 WBC Evaluated by urology, recommend Myrbetriq for bladder spasm, continue antibiotics.  Continue antibiotic Monitor INR Continue Flomax Started on Myrbetriq Discontinue pyridium  due to renal insufficiency.  Schedule tylenol for pain.  His at risk for bleeding due to supra-therapeutic INR, Thrombocytopenia, Aspirin.   Supra-therapeutic INR;  Unclear why INR increased. He has been following same diet and no new medications, no diarrhea.   Liver enzymes normal.   General Weakness;  Suspect related to acute illness, deconditioning, age. Will need PT Will check TSH and B 12.   Normocytic anemia: Hb stable. 11  A-fib: History of mechanical aortic valve replacement Resume coumadin, INR down to 2.6 If INR drop further might need heparin gtt.  Continue with amiodarone.   Coronary artery disease: On aspirin. Holding due to hematuria.   CKD stage IIIb Prior cr per records 1.4--1.5 Cr increasing to 1.6 Start IV fluids.   Chronic systolic heart failure Recent EF recover 65  % 2020. Not on Diuretics.  On Crestor.   Hyperlipidemia: On Crestor.   History of hypertension; PRN Hydralazine.   Dementia: Acute Delirium; De;irium precaution.  In setting acute illness, pain, unfamiliar environment.    Hyponatremia: Monitor.   Thrombocytopenia, chronic He has been previous evaluated by hematology, thought related to Bone marrow suppression.  Stable.   OSA on CPAP     Estimated body mass index is 30.27 kg/m as calculated from the following:   Height as of this encounter: 5' (1.524 m).   Weight as of this encounter: 70.3 kg.   DVT prophylaxis: SCD Code Status: Discussed with Family at bedside. Patient is DNR, LIMITED ADDITIONAL INTERVENTIONS: Use medication/IV fluids and cardiac monitoring as indicated; Do not use intubation or mechanical ventilation (DNI), also provide comfort medications.  Transfer to Progressive/Stepdown as indicated, avoid Intensive Care.  Family Communication: Son and daughter in Sports coach.  Disposition Plan:  Status is: Inpatient Remains inpatient appropriate because: needs pain management, improvement of delirium, improvement of  renal function. PT, OT evaluation.     Consultants:  Urology   Procedures:    Antimicrobials:    Subjective: He is calm. He was agitated last night. He was having a lot pain, he received IV fentanyl last night. Urine is orange in color, no blood this am.  He can get very agitated, I would try to get out of bed. Family will be able to be with him overnight.  They are worry about his pain controlled, that he could pull foley catheter.  His diet has not change, no new medications, they wonder why INR was so elevated. Family will check INR more frequently now at home.  -he has been more weak lately.  He has chronic essential tremors.   Objective: Vitals:   04/12/22 1321 04/12/22 2049 04/12/22 2306 04/13/22 0450  BP: (!) 146/57 (!) 127/51  (!) 111/50  Pulse: (!) 56 (!) 54  (!) 49  Resp: 18     Temp: 99.5 F (37.5 C) (!) 100.6 F (38.1 C)  98.6 F (37 C)  TempSrc: Oral Oral  Oral  SpO2: 98% 96% 95% 95%  Weight:      Height:        Intake/Output Summary (Last 24 hours) at 04/13/2022 0719 Last data filed at 04/13/2022 0600 Gross per 24 hour  Intake 390 ml  Output 700 ml  Net -310 ml   Filed Weights   04/12/22 0333  Weight: 70.3 kg    Examination:  General exam: Appears calm and comfortable  Respiratory system: Clear to auscultation. Respiratory effort normal. Cardiovascular system: S1 & S2 heard, RRR. Click aortic mechanical valve present.  Gastrointestinal system: Abdomen is nondistended, soft and nontender. No organomegaly or masses felt. Normal bowel sounds heard. Central nervous system: sleepy, wake up and say few words.  Extremities: no edema    Data Reviewed: I have personally reviewed following labs and imaging studies  CBC: Recent Labs  Lab 04/12/22 0455 04/12/22 1400 04/13/22 0409  WBC 5.6  --  6.7  NEUTROABS 4.7  --   --   HGB 11.4* 12.3* 11.1*  HCT 35.3* 38.8* 35.0*  MCV 92.9  --  94.3  PLT 112*  --  99*   Basic Metabolic Panel: Recent Labs   Lab 04/12/22 0455 04/13/22 0409  NA 130* 132*  K 4.0 4.5  CL 101 104  CO2 22 22  GLUCOSE 112* 101*  BUN 27* 30*  CREATININE 1.45* 1.68*  CALCIUM 8.8* 8.7*   GFR: Estimated Creatinine Clearance: 23.1 mL/min (A) (by C-G formula based on SCr of 1.68 mg/dL (H)). Liver Function Tests: Recent Labs  Lab 04/13/22 0409  AST 18  ALT 12  ALKPHOS 41  BILITOT 0.9  PROT 6.8  ALBUMIN 3.3*   No results for input(s): "LIPASE", "AMYLASE" in the last 168 hours. No results for input(s): "AMMONIA" in the last 168 hours. Coagulation Profile: Recent Labs  Lab 04/12/22 0505 04/13/22 0409  INR 4.2* 2.6*   Cardiac Enzymes: No results for input(s): "CKTOTAL", "CKMB", "CKMBINDEX", "TROPONINI" in the last 168 hours. BNP (last 3 results) No results for input(s): "PROBNP" in the last 8760 hours. HbA1C: No results for input(s): "HGBA1C" in the last 72 hours. CBG: No results for input(s): "GLUCAP" in the last 168 hours. Lipid Profile: No results for input(s): "CHOL", "HDL", "LDLCALC", "TRIG", "CHOLHDL", "LDLDIRECT" in the last 72 hours. Thyroid Function  Tests: No results for input(s): "TSH", "T4TOTAL", "FREET4", "T3FREE", "THYROIDAB" in the last 72 hours. Anemia Panel: No results for input(s): "VITAMINB12", "FOLATE", "FERRITIN", "TIBC", "IRON", "RETICCTPCT" in the last 72 hours. Sepsis Labs: No results for input(s): "PROCALCITON", "LATICACIDVEN" in the last 168 hours.  No results found for this or any previous visit (from the past 240 hour(s)).       Radiology Studies: CT Renal Stone Study  Result Date: 04/11/2022 CLINICAL DATA:  Abdominal pain, gross hematuria EXAM: CT ABDOMEN AND PELVIS WITHOUT CONTRAST TECHNIQUE: Multidetector CT imaging of the abdomen and pelvis was performed following the standard protocol without IV contrast. RADIATION DOSE REDUCTION: This exam was performed according to the departmental dose-optimization program which includes automated exposure control,  adjustment of the mA and/or kV according to patient size and/or use of iterative reconstruction technique. COMPARISON:  None Available. FINDINGS: Lower chest: No acute abnormality. Bilateral calcified pleural plaques along the diaphragmatic surface. Hepatobiliary: No focal liver abnormality is seen. No gallstones, gallbladder wall thickening, or biliary dilatation. Pancreas: Unremarkable. No pancreatic ductal dilatation or surrounding inflammatory changes. Spleen: Normal in size without focal abnormality. Adrenals/Urinary Tract: Adrenal glands are unremarkable. Kidneys are normal, without renal calculi, focal lesion, or hydronephrosis. Bladder is unremarkable. Stomach/Bowel: Stomach is within normal limits. No evidence of bowel wall thickening, distention, or inflammatory changes. Moderate amount of stool in the ascending and transverse colon. Diverticulosis without evidence of diverticulitis. Haziness in the mesenteric fat as can be seen with mesenteric panniculitis or can be idiopathic. Vascular/Lymphatic: Aortic atherosclerosis. No enlarged abdominal or pelvic lymph nodes. Reproductive: Prostate is unremarkable. Prostatic radiation seeds noted. Other: No abdominal wall hernia or abnormality. No abdominopelvic ascites. Musculoskeletal: No acute osseous abnormality. No aggressive osseous lesion. Degenerative disease with disc height loss at L1-2, L4-5 and L5-S1. Bilateral facet arthropathy throughout the lumbar spine. Levoscoliosis of the lumbar spine. IMPRESSION: 1. No acute abdominal or pelvic pathology. 2. Diverticulosis without evidence of diverticulitis. 3. Haziness in the mesenteric fat as can be seen with mesenteric panniculitis or can be idiopathic. 4. Bilateral calcified pleural plaques along the diaphragmatic surface consistent with prior asbestos exposure. 5.  Aortic Atherosclerosis (ICD10-I70.0). Electronically Signed   By: Kathreen Devoid M.D.   On: 04/11/2022 13:05        Scheduled Meds:   amiodarone  100 mg Oral Daily   cephALEXin  500 mg Oral Q8H   Chlorhexidine Gluconate Cloth  6 each Topical Daily   mirabegron ER  25 mg Oral Daily   mometasone-formoterol  2 puff Inhalation BID   rosuvastatin  20 mg Oral QPM   tamsulosin  0.4 mg Oral QPC supper   Warfarin - Pharmacist Dosing Inpatient   Does not apply q1600   Continuous Infusions:   LOS: 1 day    Time spent: 35 minutes.     Elmarie Shiley, MD [facility]   If 7PM-7AM, please contact night-coverage [email or web address]  04/13/2022, 7:19 AM

## 2022-04-13 NOTE — Evaluation (Signed)
Occupational Therapy Evaluation Patient Details Name: Tanner Ferguson MRN: [id number] DOB: 1927/07/26 Today's Date: 04/13/2022   History of Present Illness 89 year old admitted withfor hematuria.  PMHsignificant for asthma, dementia, essential tremor, hyperlipidemia, hypertension, history of pancytopenia, prostate cancer, seizure disorder, CAD, history of non-STEMI, history of systolic heart failure ejection fraction 36% on Myoview stress test September 2019, recovered ejection fraction of 60% on 2020 by echo, paroxysmal A-fib, mechanical aortic valve.   Clinical Impression   Pt currently mod assist for functional mobility in the room without use of an assistive device.  Mod to max assist for simulated selfcare tasks secondary to essential tremors.  Family provided assist for bathing, dressing, and toileting tasks at home.  Feel he is slightly weaker now with impaired balance affecting ADL independence.  Recommend acute care OT at this time to help increase ADL and transfer independence.        Recommendations for follow up therapy are one component of a multi-disciplinary discharge planning process, led by the attending physician.  Recommendations may be updated based on patient status, additional functional criteria and insurance authorization.   Follow Up Recommendations  Home health OT     Assistance Recommended at Discharge Frequent or constant Supervision/Assistance  Patient can return home with the following A little help with walking and/or transfers;A little help with bathing/dressing/bathroom;Assistance with cooking/housework;Assist for transportation;Direct supervision/assist for medications management;Help with stairs or ramp for entrance    Functional Status Assessment  Patient has had a recent decline in their functional status and demonstrates the ability to make significant improvements in function in a reasonable and predictable amount of time.  Equipment Recommendations  None  recommended by OT       Precautions / Restrictions Precautions Precautions: Fall Precaution Comments: essential tremor BUEs Restrictions Weight Bearing Restrictions: No      Mobility Bed Mobility Overal bed mobility: Needs Assistance Bed Mobility: Supine to Sit     Supine to sit: Min guard          Transfers Overall transfer level: Needs assistance   Transfers: Sit to/from Stand, Bed to chair/wheelchair/BSC Sit to Stand: Min assist     Step pivot transfers: Mod assist     General transfer comment: Mod assist needed for functional mobility from the EOB around to the recliner with hand held assist on the left side.      Balance Overall balance assessment: Needs assistance   Sitting balance-Leahy Scale: Fair Sitting balance - Comments: Pt able to maintain sitting balance with static sitting, slight increased posterior lean when attempting to donn his gripper socks.     Standing balance-Leahy Scale: Poor Standing balance comment: Pt needs UE support for balance during mobilization in the room.                           ADL either performed or assessed with clinical judgement   ADL Overall ADL's : Needs assistance/impaired Eating/Feeding: Supervision/ safety;Sitting Eating/Feeding Details (indicate cue type and reason): tremors affect feeding, pt tends to make a substantial mess per daughter-in-law Grooming: Wash/dry hands;Wash/dry face;Sitting;Set up Grooming Details (indicate cue type and reason): simulated Upper Body Bathing: Supervision/ safety;Sitting Upper Body Bathing Details (indicate cue type and reason): simulated Lower Body Bathing: Moderate assistance;Sit to/from stand Lower Body Bathing Details (indicate cue type and reason): simulated Upper Body Dressing : Moderate assistance;Sitting Upper Body Dressing Details (indicate cue type and reason): simulated Lower Body Dressing: Maximal assistance;Sit to/from stand Lower Body Dressing  Details  (indicate cue type and reason): for donning gripper socks only Toilet Transfer: Moderate assistance;Ambulation Toilet Transfer Details (indicate cue type and reason): no assistive device, simulated secondary to pt declining need to toilet         Functional mobility during ADLs: Moderate assistance (hand held assist) General ADL Comments: Per pt and daughter-in-law pt used no assistive device at home and would ambulate to and from the bathroom without assist.  He did need substantial assist from his son for toilet hygiene as well as bathing and dressing.  Has shower seat and grab bars in the walk-in shower for safety.   Able to get up from regular toilet at home with use of the vanity on one side and his shower seat on the other per report.  Lives in a basement room/apartment and his son helps him up and down the steps daily for meals.     Vision Baseline Vision/History: 1 Wears glasses Ability to See in Adequate Light: 0 Adequate Patient Visual Report: No change from baseline Vision Assessment?: No apparent visual deficits     Perception  Not tested   Praxis  Not tested    Pertinent Vitals/Pain Pain Assessment Pain Assessment: Faces Faces Pain Scale: No hurt     Hand Dominance Right   Extremity/Trunk Assessment Upper Extremity Assessment Upper Extremity Assessment: Generalized weakness (bilateral strength 4/5 throughout.  Demonstrates essential tremors bilaterally affecting gross motor tasks and FM tasks.)   Lower Extremity Assessment Lower Extremity Assessment: Defer to PT evaluation   Cervical / Trunk Assessment Cervical / Trunk Assessment: Kyphotic   Communication Communication Communication: HOH   Cognition Arousal/Alertness: Awake/alert Behavior During Therapy: WFL for tasks assessed/performed Overall Cognitive Status: History of cognitive impairments - at baseline                                                  Home Living Family/patient  expects to be discharged to:: Private residence Living Arrangements: Children (lives with son and daughter-in-law) Available Help at Discharge: Family Type of Home: House Home Access: Stairs to enter Technical brewer of Steps: 3 steps in the garage   Home Layout: Two level Alternate Level Stairs-Number of Steps: landing half way down, likely close to 16 steps total Alternate Level Stairs-Rails: Right Bathroom Shower/Tub: Other (comment) (walk-in shower and bedroom are down the steps.)   Bathroom Toilet: Standard Bathroom Accessibility: Yes   Home Equipment: Wheelchair - manual;Shower seat;Grab bars - tub/shower          Prior Functioning/Environment Prior Level of Function : Needs assist             Mobility Comments: typically ambulatory without DME; son assists pt on stairs; pushed in w/c by family for longer distances (i.e. to doctor appt); does not drive. enjoys watching TV ADLs Comments: Walking to the bathroom on his own without an assistive device, needed assist for peri clleaning after BM.  Family completes all aspects of bathing and dressing per daughter-in-law.        OT Problem List: Decreased strength;Decreased knowledge of use of DME or AE;Decreased coordination;Impaired balance (sitting and/or standing);Decreased cognition;Impaired UE functional use      OT Treatment/Interventions: Self-care/ADL training;Patient/family education;Balance training;Therapeutic activities;DME and/or AE instruction    OT Goals(Current goals can be found in the care plan section) Acute Rehab OT Goals Patient Stated  Goal: Pt wants to get back to moving and getting stronger OT Goal Formulation: With patient/family Time For Goal Achievement: 04/27/22 Potential to Achieve Goals: Good  OT Frequency:         AM-PAC OT "6 Clicks" Daily Activity     Outcome Measure Help from another person eating meals?: A Little Help from another person taking care of personal grooming?: A  Little Help from another person toileting, which includes using toliet, bedpan, or urinal?: A Lot Help from another person bathing (including washing, rinsing, drying)?: A Lot Help from another person to put on and taking off regular upper body clothing?: A Little Help from another person to put on and taking off regular lower body clothing?: A Lot 6 Click Score: 15   End of Session Nurse Communication: Mobility status  Activity Tolerance: Patient tolerated treatment well Patient left: in chair;with chair alarm set  OT Visit Diagnosis: Unsteadiness on feet (R26.81);Muscle weakness (generalized) (M62.81);Feeding difficulties (R63.3)                Time: [id number] OT Time Calculation (min): 42 min Charges:  OT General Charges $OT Visit: 1 Visit OT Evaluation $OT Eval Moderate Complexity: 1 Mod OT Treatments $Self Care/Home Management : 23-37 mins  Clyda Greener, OTR/L [place]  Office [phone] 04/13/2022  04/13/2022, 3:58 PM

## 2022-04-13 NOTE — Evaluation (Signed)
Physical Therapy Evaluation Patient Details Name: Tanner Ferguson MRN: [id number] DOB: [birth year] Today's Date: 04/13/2022  History of Present Illness  89 year old admitted withfor hematuria.  PMHsignificant for asthma, dementia, essential tremor, hyperlipidemia, hypertension, history of pancytopenia, prostate cancer, seizure disorder, CAD, history of non-STEMI, history of systolic heart failure ejection fraction 36% on Myoview stress test September 2019, recovered ejection fraction of 60% on 2020 by echo, paroxysmal A-fib, mechanical aortic valve.  Clinical Impression  On eval, pt required Min A for mobility. He walked ~175 feet with a RW. Pt tolerated activity well. He is weaker and less steady than his baseline. Daughter was present during session. Discussed d/c plan-pt will return home where he lives with his daughter and her husband. Daughter's husband is able to be home with the patient. Discussed PT recommendation for HHPT-family is undecided and will think about it. Daughter feels RW usually tends to make his walking worse (due to tremors). Issued gait belt for them to take home to assist with mobilizing patient. Will continue to follow and progress activity as tolerated.        Recommendations for follow up therapy are one component of a multi-disciplinary discharge planning process, led by the attending physician.  Recommendations may be updated based on patient status, additional functional criteria and insurance authorization.  Follow Up Recommendations Home health PT (if family decides they want f/u)      Assistance Recommended at Discharge Frequent or constant Supervision/Assistance  Patient can return home with the following  A little help with walking and/or transfers;A little help with bathing/dressing/bathroom;Assistance with cooking/housework;Assist for transportation;Help with stairs or ramp for entrance    Equipment Recommendations None recommended by PT  Recommendations for  Other Services       Functional Status Assessment Patient has had a recent decline in their functional status and demonstrates the ability to make significant improvements in function in a reasonable and predictable amount of time.     Precautions / Restrictions Precautions Precautions: Fall Precaution Comments: essential tremor Restrictions Weight Bearing Restrictions: No      Mobility  Bed Mobility Overal bed mobility: Needs Assistance Bed Mobility: Supine to Sit, Sit to Supine     Supine to sit: Min guard, HOB elevated Sit to supine: Min guard, HOB elevated   General bed mobility comments: Increased time. Effortful especially with tremor. No external assistance required however    Transfers Overall transfer level: Needs assistance Equipment used: Rolling walker (2 wheels) Transfers: Sit to/from Stand Sit to Stand: Min assist           General transfer comment: Assist to rise, steady, control descent. Cues for safety.    Ambulation/Gait Ambulation/Gait assistance: Min assist Gait Distance (Feet): 175 Feet Assistive device: Rolling walker (2 wheels) Gait Pattern/deviations: Step-through pattern, Decreased stride length       General Gait Details: Min A to steady pt and manage RW. Tremor tends to make RW use a bit effortful. He tolerated distance well.  Stairs            Wheelchair Mobility    Modified Rankin (Stroke Patients Only)       Balance Overall balance assessment: Needs assistance         Standing balance support: Bilateral upper extremity supported, Reliant on assistive device for balance, During functional activity Standing balance-Leahy Scale: Poor  Pertinent Vitals/Pain Pain Assessment Pain Assessment: Faces Faces Pain Scale: No hurt    Home Living Family/patient expects to be discharged to:: Private residence Living Arrangements: Children (lives with son and  daughter-in-law) Available Help at Discharge: Family Type of Home: House Home Access: Stairs to enter   Technical brewer of Steps: 3 steps in the garage Alternate Level Stairs-Number of Steps: landing half way down, likely close to 16 steps total Home Layout: Two level Home Equipment: Wheelchair - manual;Shower seat;Grab bars - tub/shower      Prior Function Prior Level of Function : Needs assist             Mobility Comments: typically ambulatory without DME; son assists pt on stairs; pushed in w/c by family for longer distances (i.e. to doctor appt); does not drive. enjoys watching TV ADLs Comments: Walking to the bathroom on his own without an assistive device, needed assist for peri clleaning after BM.  Family completes all aspects of bathing and dressing per daughter-in-law.     Hand Dominance   Dominant Hand: Right    Extremity/Trunk Assessment   Upper Extremity Assessment Upper Extremity Assessment: Defer to OT evaluation    Lower Extremity Assessment Lower Extremity Assessment: Generalized weakness    Cervical / Trunk Assessment Cervical / Trunk Assessment: Kyphotic  Communication   Communication: HOH  Cognition Arousal/Alertness: Awake/alert Behavior During Therapy: WFL for tasks assessed/performed Overall Cognitive Status: History of cognitive impairments - at baseline                                 General Comments: pt participated well        General Comments      Exercises     Assessment/Plan    PT Assessment Patient needs continued PT services  PT Problem List Decreased strength;Decreased activity tolerance;Decreased balance;Decreased mobility;Decreased knowledge of use of DME;Decreased cognition;Decreased safety awareness       PT Treatment Interventions DME instruction;Gait training;Therapeutic exercise;Balance training;Functional mobility training;Therapeutic activities;Patient/family education    PT Goals (Current  goals can be found in the Care Plan section)  Acute Rehab PT Goals Patient Stated Goal: per family, home soon PT Goal Formulation: With family Time For Goal Achievement: 04/27/22 Potential to Achieve Goals: Good    Frequency Min 3X/week     Co-evaluation               AM-PAC PT "6 Clicks" Mobility  Outcome Measure Help needed turning from your back to your side while in a flat bed without using bedrails?: A Little Help needed moving from lying on your back to sitting on the side of a flat bed without using bedrails?: A Little Help needed moving to and from a bed to a chair (including a wheelchair)?: A Little Help needed standing up from a chair using your arms (e.g., wheelchair or bedside chair)?: A Little Help needed to walk in hospital room?: A Little Help needed climbing 3-5 steps with a railing? : A Little 6 Click Score: 18    End of Session Equipment Utilized During Treatment: Gait belt Activity Tolerance: Patient tolerated treatment well Patient left: in bed;with call bell/phone within reach;with bed alarm set;with family/visitor present   PT Visit Diagnosis: Muscle weakness (generalized) (M62.81);Difficulty in walking, not elsewhere classified (R26.2)    Time: [id number] PT Time Calculation (min) (ACUTE ONLY): 13 min   Charges:   PT Evaluation $PT Eval Low Complexity: 1 Low  [facility]  Office: [phone]

## 2022-04-13 NOTE — Progress Notes (Signed)
  Echocardiogram 2D Echocardiogram has been performed.  Tanner Ferguson 04/13/2022, 10:10 AM

## 2022-04-14 DIAGNOSIS — R310 Gross hematuria: Secondary | ICD-10-CM | POA: Diagnosis not present

## 2022-04-14 LAB — CBC
HCT: 33.5 % — ABNORMAL LOW (ref 39.0–52.0)
Hemoglobin: 10.5 g/dL — ABNORMAL LOW (ref 13.0–17.0)
MCH: 29.7 pg (ref 26.0–34.0)
MCHC: 31.3 g/dL (ref 30.0–36.0)
MCV: 94.9 fL (ref 80.0–100.0)
Platelets: 94 10*3/uL — ABNORMAL LOW (ref 150–400)
RBC: 3.53 MIL/uL — ABNORMAL LOW (ref 4.22–5.81)
RDW: 15.1 % (ref 11.5–15.5)
WBC: 5.3 10*3/uL (ref 4.0–10.5)
nRBC: 0 % (ref 0.0–0.2)

## 2022-04-14 LAB — BASIC METABOLIC PANEL
Anion gap: 8 (ref 5–15)
BUN: 28 mg/dL — ABNORMAL HIGH (ref 8–23)
CO2: 20 mmol/L — ABNORMAL LOW (ref 22–32)
Calcium: 8.6 mg/dL — ABNORMAL LOW (ref 8.9–10.3)
Chloride: 105 mmol/L (ref 98–111)
Creatinine, Ser: 1.67 mg/dL — ABNORMAL HIGH (ref 0.61–1.24)
GFR, Estimated: 38 mL/min — ABNORMAL LOW (ref >60)
Glucose, Bld: 102 mg/dL — ABNORMAL HIGH (ref 70–99)
Potassium: 3.7 mmol/L (ref 3.5–5.1)
Sodium: 133 mmol/L — ABNORMAL LOW (ref 135–145)

## 2022-04-14 LAB — PROTIME-INR
INR: 1.5 — ABNORMAL HIGH (ref 0.8–1.2)
Prothrombin Time: 18.3 seconds — ABNORMAL HIGH (ref 11.4–15.2)

## 2022-04-14 LAB — VITAMIN B12: Vitamin B-12: 1198 pg/mL — ABNORMAL HIGH (ref 180–914)

## 2022-04-14 LAB — TSH: TSH: 6.894 u[IU]/mL — ABNORMAL HIGH (ref 0.350–4.500)

## 2022-04-14 LAB — T4, FREE: Free T4: 1.33 ng/dL — ABNORMAL HIGH (ref 0.61–1.12)

## 2022-04-14 LAB — HEPARIN LEVEL (UNFRACTIONATED): Heparin Unfractionated: 0.26 IU/mL — ABNORMAL LOW (ref 0.30–0.70)

## 2022-04-14 MED ORDER — WARFARIN SODIUM 4 MG PO TABS
4.0000 mg | ORAL_TABLET | Freq: Once | ORAL | Status: AC
  Administered 2022-04-14: 4 mg via ORAL
  Filled 2022-04-14: qty 1

## 2022-04-14 MED ORDER — HEPARIN BOLUS VIA INFUSION
3200.0000 [IU] | Freq: Once | INTRAVENOUS | Status: AC
  Administered 2022-04-14: 3200 [IU] via INTRAVENOUS
  Filled 2022-04-14: qty 3200

## 2022-04-14 MED ORDER — TRAZODONE HCL 50 MG PO TABS: 50.0000 mg | ORAL_TABLET | Freq: Every evening | ORAL | Status: DC | PRN

## 2022-04-14 MED ORDER — HYDRALAZINE HCL 20 MG/ML IJ SOLN: 10.0000 mg | INTRAMUSCULAR | Status: DC | PRN

## 2022-04-14 MED ORDER — HEPARIN (PORCINE) 25000 UT/250ML-% IV SOLN
950.0000 [IU]/h | INTRAVENOUS | Status: DC
  Administered 2022-04-14: 950 [IU]/h via INTRAVENOUS
  Filled 2022-04-14: qty 250

## 2022-04-14 MED ORDER — ACETAMINOPHEN 325 MG PO TABS: 650.0000 mg | ORAL_TABLET | Freq: Four times a day (QID) | ORAL | Status: DC | PRN

## 2022-04-14 MED ORDER — HEPARIN (PORCINE) 25000 UT/250ML-% IV SOLN
1150.0000 [IU]/h | INTRAVENOUS | Status: AC
  Administered 2022-04-14: 1100 [IU]/h via INTRAVENOUS
  Administered 2022-04-16: 1150 [IU]/h via INTRAVENOUS
  Filled 2022-04-14 (×2): qty 250

## 2022-04-14 MED ORDER — SENNOSIDES-DOCUSATE SODIUM 8.6-50 MG PO TABS: 1.0000 | ORAL_TABLET | Freq: Every evening | ORAL | Status: DC | PRN

## 2022-04-14 MED ORDER — IPRATROPIUM-ALBUTEROL 0.5-2.5 (3) MG/3ML IN SOLN: 3.0000 mL | RESPIRATORY_TRACT | Status: DC | PRN

## 2022-04-14 NOTE — Progress Notes (Signed)
[facility] for warfarin and heparin bridge Indication: atrial fibrillation, mechanical aortic valve   Allergies  Allergen Reactions   Penicillins Rash    Patient Measurements: Height: 5' (152.4 cm) Weight: 70.3 kg (155 lb) IBW/kg (Calculated) : 50   Vital Signs: Temp: 98.1 F (36.7 C) (02/14 1328) Temp Source: Oral (02/14 1328) BP: 118/39 (02/14 1328) Pulse Rate: 49 (02/14 1328)  Labs: Recent Labs    04/12/22 0455 04/12/22 0505 04/12/22 1400 04/13/22 0409 04/14/22 0421 04/14/22 1732  HGB 11.4*  --  12.3* 11.1* 10.5*  --   HCT 35.3*  --  38.8* 35.0* 33.5*  --   PLT 112*  --   --  99* 94*  --   LABPROT  --  39.9*  --  27.2* 18.3*  --   INR  --  4.2*  --  2.6* 1.5*  --   HEPARINUNFRC  --   --   --   --   --  0.26*  CREATININE 1.45*  --   --  1.68* 1.67*  --      Estimated Creatinine Clearance: 23.2 mL/min (A) (by C-G formula based on SCr of 1.67 mg/dL (H)).   Medical History: Past Medical History:  Diagnosis Date   Asthma    CAD (coronary artery disease)    s/p NSTEMI 9/19. Cath in [place], [place] 10/19. Severe CAD not amenable to PCI. Not CABG candidate   CHF (congestive heart failure) (HCC)    EF 40-45% by echo due to iCM. EF 36% by Myoview 9/19   Dementia (HCC)    Essential tremor    Hyperlipidemia    Intolerant of statins   Hypertension    PAF (paroxysmal atrial fibrillation) ([place])    Pancytopenia ([place])    Follows with hematology   Prostate cancer ([place])    Seizure ([place])     Assessment: Tanner Ferguson is a 89 y.o. male with history of dementia, CHF, history of pancytopenia, prostate cancer, seizure disorder, CAD, history NSTEMI, paroxysmal atrial fibrillation, mechanical aortic valve on warfarin who presented to the emergency department with complaints of Foley catheter malfunction and hematuria.  Home warfarin regimen: 2.5 mg Sun, Tue, Thur and 1.25 mg Mon, Wed, Fri, Sat Patient/family check INR at home and report  value to anti-coag clinic. From previous notes, it appears patient is stable on current dose of warfarin.  INR supratherapeutic on admission - concerning in setting of hematuria prior to admission. Patient and family report no dietary or medication changes, no obvious reason for elevated INR. Warfarin held and patient given Vit K 3 mg subcu 2/12 with subsequent decline in INR.   Today, 2/14 -INR 1.5 - subtherapeutic -Urine orange 2/13 (likely in setting of Pyridium administration previous evening), no blood noted -CBC has drifted down, but relatively stable -Plan to start heparin bridge in setting of subtherapeutic INR and mechanical valve - continue until INR > 2.5  PM: first heparin level subtherapeutic (0.26) on heparin 950 units/hr. No pauses or interruptions to infusion per discussion with RN and hematuria has resolved.   Goal of Therapy:  INR 2.5-3.5 Monitor platelets by anticoagulation protocol: Yes   Plan:  -Increase Heparin drip to 1100 units/hr -Check heparin level ~ 8 hrs after rate increased -Continue to monitor for signs/symptoms of bleeding, especially return of hematuria  Peggyann Juba, PharmD, BCPS Pharmacy: [phone] 04/14/2022 6:11 PM

## 2022-04-14 NOTE — Progress Notes (Signed)
Mobility Specialist - Progress Note   04/14/22 1404  Mobility  Activity Ambulated with assistance in hallway  Level of Assistance Contact guard assist, steadying assist  Assistive Device Front wheel walker  Distance Ambulated (ft) 200 ft  Range of Motion/Exercises Active  Activity Response Tolerated well  Mobility Referral Yes  $Mobility charge 1 Mobility   Pt was found in bed and agreeable to ambulate. Tremors persistent during session needing assistance for hand placement on RW. No complaints during session but did started breathing a little heavier at EOS due to fatigue. At EOS returned to recliner chair with necessities and family in room. Chair alarm on.  Ferd Hibbs Mobility Specialist

## 2022-04-14 NOTE — Progress Notes (Signed)
Occupational Therapy Treatment Patient Details Name: Tanner Ferguson MRN: [id number] DOB: Apr 07, 1927 Today's Date: 04/14/2022   History of present illness 89 year old admitted withfor hematuria.  PMHsignificant for asthma, dementia, essential tremor, hyperlipidemia, hypertension, history of pancytopenia, prostate cancer, seizure disorder, CAD, history of non-STEMI, history of systolic heart failure ejection fraction 36% on Myoview stress test September 2019, recovered ejection fraction of 60% on 2020 by echo, paroxysmal A-fib, mechanical aortic valve.   OT comments  Patient ambulated to and from bathroom with min guard - mostly to manage IV. He was able to stand at sink and perform hand washing. No loss of balance or fatigue. In front of recliner he demonstrated ability to take his hands off of walker to manage his clothing and ability to bend down and don his socks. Patient's family in room and reporting he is near his baseline.  From a functional standpoint he has improved quickly. He has no further OT needs at this time.   Recommendations for follow up therapy are one component of a multi-disciplinary discharge planning process, led by the attending physician.  Recommendations may be updated based on patient status, additional functional criteria and insurance authorization.    Follow Up Recommendations  No OT follow up     Assistance Recommended at Discharge Intermittent Supervision/Assistance  Patient can return home with the following  A little help with walking and/or transfers;Assistance with cooking/housework;Assist for transportation;Direct supervision/assist for medications management;Help with stairs or ramp for entrance   Equipment Recommendations  None recommended by OT    Recommendations for Other Services      Precautions / Restrictions Precautions Precautions: Fall Precaution Comments: essential tremor Restrictions Weight Bearing Restrictions: No       Mobility Bed  Mobility                    Transfers                         Balance Overall balance assessment: Mild deficits observed, not formally tested                                         ADL either performed or assessed with clinical judgement   ADL Overall ADL's : At baseline     Grooming: Supervision/safety;Standing Grooming Details (indicate cue type and reason): at sink             Lower Body Dressing: Supervision/safety;Sitting/lateral leans Lower Body Dressing Details (indicate cue type and reason): to don socks - and to pull up simulated clothing             Functional mobility during ADLs: Min guard;Rolling walker (2 wheels)      Extremity/Trunk Assessment Upper Extremity Assessment Upper Extremity Assessment: Overall WFL for tasks assessed   Lower Extremity Assessment Lower Extremity Assessment: Overall WFL for tasks assessed   Cervical / Trunk Assessment Cervical / Trunk Assessment: Kyphotic    Vision Patient Visual Report: No change from baseline     Perception     Praxis      Cognition Arousal/Alertness: Awake/alert Behavior During Therapy: WFL for tasks assessed/performed Overall Cognitive Status: History of cognitive impairments - at baseline  Exercises      Shoulder Instructions       General Comments      Pertinent Vitals/ Pain       Pain Assessment Pain Assessment: No/denies pain  Home Living                                          Prior Functioning/Environment              Frequency           Progress Toward Goals  OT Goals(current goals can now be found in the care plan section)  Progress towards OT goals: Goals met/education completed, patient discharged from OT  Acute Rehab OT Goals OT Goal Formulation: All assessment and education complete, DC therapy  Plan All goals met and education completed,  patient discharged from OT services    Co-evaluation          OT goals addressed during session: ADL's and self-care      AM-PAC OT "6 Clicks" Daily Activity     Outcome Measure   Help from another person eating meals?: A Little Help from another person taking care of personal grooming?: None Help from another person toileting, which includes using toliet, bedpan, or urinal?: None Help from another person bathing (including washing, rinsing, drying)?: A Little Help from another person to put on and taking off regular upper body clothing?: A Little Help from another person to put on and taking off regular lower body clothing?: A Little 6 Click Score: 20    End of Session Equipment Utilized During Treatment: Rolling walker (2 wheels)  OT Visit Diagnosis: Unsteadiness on feet (R26.81);Muscle weakness (generalized) (M62.81);Feeding difficulties (R63.3)   Activity Tolerance Patient tolerated treatment well   Patient Left in chair;with chair alarm set   Nurse Communication Mobility status        Time: 1515-1530 OT Time Calculation (min): 15 min  Charges: OT General Charges $OT Visit: 1 Visit OT Treatments $Self Care/Home Management : 8-22 mins  Gustavo Lah, OTR/L [place]  Office [phone]   Lenward Chancellor 04/14/2022, 3:59 PM

## 2022-04-14 NOTE — Progress Notes (Signed)
[place] for warfarin and heparin bridge Indication: atrial fibrillation, mechanical aortic valve   Allergies  Allergen Reactions   Penicillins Rash    Patient Measurements: Height: 5' (152.4 cm) Weight: 70.3 kg (155 lb) IBW/kg (Calculated) : 50   Vital Signs: Temp: 97.9 F (36.6 C) (02/14 0444) Temp Source: Oral (02/14 0444) BP: 150/47 (02/14 0444) Pulse Rate: 48 (02/14 0444)  Labs: Recent Labs    04/12/22 0455 04/12/22 0505 04/12/22 1400 04/13/22 0409 04/14/22 0421  HGB 11.4*  --  12.3* 11.1* 10.5*  HCT 35.3*  --  38.8* 35.0* 33.5*  PLT 112*  --   --  99* 94*  LABPROT  --  39.9*  --  27.2* 18.3*  INR  --  4.2*  --  2.6* 1.5*  CREATININE 1.45*  --   --  1.68* 1.67*     Estimated Creatinine Clearance: 23.2 mL/min (A) (by C-G formula based on SCr of 1.67 mg/dL (H)).   Medical History: Past Medical History:  Diagnosis Date   Asthma    CAD (coronary artery disease)    s/p NSTEMI 9/19. Cath in [place], [place] 10/19. Severe CAD not amenable to PCI. Not CABG candidate   CHF (congestive heart failure) (HCC)    EF 40-45% by echo due to iCM. EF 36% by Myoview 9/19   Dementia (HCC)    Essential tremor    Hyperlipidemia    Intolerant of statins   Hypertension    PAF (paroxysmal atrial fibrillation) ([place])    Pancytopenia ([place])    Follows with hematology   Prostate cancer ([place])    Seizure ([place])     Assessment: Tanner Ferguson is a 89 y.o. male with history of dementia, CHF, history of pancytopenia, prostate cancer, seizure disorder, CAD, history NSTEMI, paroxysmal atrial fibrillation, mechanical aortic valve on warfarin who presented to the emergency department with complaints of Foley catheter malfunction and hematuria.  Home warfarin regimen: 2.5 mg Sun, Tue, Thur and 1.25 mg Mon, Wed, Fri, Sat Patient/family check INR at home and report value to anti-coag clinic. From previous notes, it appears patient is stable on current dose  of warfarin.  INR supratherapeutic on admission - concerning in setting of hematuria prior to admission. Patient and family report no dietary or medication changes, no obvious reason for elevated INR. Warfarin held and patient given Vit K 3 mg subcu 2/12 with subsequent decline in INR.   Today, 2/14 -INR 1.5 - subtherapeutic -Urine orange 2/13 (likely in setting of Pyridium administration previous evening), no blood noted -CBC has drifted down, but relatively stable -Plan to start heparin bridge in setting of subtherapeutic INR and mechanical valve - continue until INR > 2.5   Goal of Therapy:  INR 2.5-3.5 Monitor platelets by anticoagulation protocol: Yes   Plan:  -Heparin 3200 unit bolus -Heparin infusion at 950 units/hr -Check heparin level ~ 8 hrs after start of infusion -Warfarin 4 mg x 1 today in setting of subtherapeutic INR -Continue to monitor for signs/symptoms of bleeding, especially return of hematuria  Tawnya Crook, PharmD, BCPS Clinical Pharmacist 04/14/2022 10:01 AM

## 2022-04-14 NOTE — Progress Notes (Signed)
PROGRESS NOTE    Tanner Ferguson  [id number] DOB: Nov 08, 1927 DOA: 04/12/2022 PCP: Wenda Low, MD   Brief Narrative:  89 year old with past medical history significant for asthma, dementia, essential tremor, hyperlipidemia, hypertension, history of pancytopenia, prostate cancer 10 years ago status post brachytherapy, seizure disorder, CAD, history of non-STEMI, history of systolic heart failure ejection fraction 36% on Myoview stress test September 2019, recovered ejection fraction of 60% on 2020 by echo, paroxysmal A-fib, mechanical aortic valve on warfarin who presents to the ED complaining of Foley catheter malfunction and hematuria. UA with large hemoglobinuria, moderate leukocyte esterase, proteinuria, more than 50 red blood cells more than 50 white blood cells, rare bacteria.  INR 4.2, platelets 112.  CT renal protocol with no acute abdominal pelvic pathology.  Diverticulosis no diverticulitis.  Haziness of the mesenteric fat as can be seen with mesenteric panniculitis or can be idiopathic.  Bilateral calcified pleural plaques.    Assessment & Plan:  Principal Problem:   Hematuria Active Problems:   Atrial fibrillation (HCC)   Coronary artery disease   History of mechanical aortic valve replacement   Chronic systolic heart failure (HCC)   Hyperlipidemia   Essential tremor   Hypertension   Dementia (HCC)   Hyponatremia   Thrombocytopenia (HCC)   OSA on CPAP   Stage 3b chronic kidney disease (CKD) (HCC)   Normocytic anemia   Supratherapeutic INR    Hematuria in the setting of supratherapeutic INR,  Unclear etiology of initial hematuria possibilities prior radiation, infection, malignancy.  Seen by urology, recommending continuing Foley catheter at least for 1 week and follow-up outpatient.  Myrbetriq 25 mg daily, Pyridium 200 mg 3 times daily as needed for bladder discomfort.  May need outpatient cystoscopy.  Monitor INR. On Keflex, complete 7-day course   Supra-therapeutic  INR;  Improved, INR today 1.5.  Should resume Coumadin   General Weakness;  Suspect from acute illness.  TSH slightly elevated, check free T4.  B12 normal.   Normocytic anemia: Hb stable. 11   A-fib: History of mechanical aortic valve replacement Coumadin resumed.  Goal INR 2.5-3.5   Coronary artery disease: Currently chest pain-free.  On aspirin and statin   CKD stage IIIb Creatinine around baseline of 1.6   Chronic systolic heart failure Echocardiogram showed preserved EF of 55%.   Hyperlipidemia: On Crestor.    History of hypertension; PRN Hydralazine.    Dementia: Acute Delirium; Delirium precaution.  Supportive care.    Hyponatremia: Monitor.    Thrombocytopenia, chronic He has been previous evaluated by hematology, thought related to Bone marrow suppression.  Stable.    OSA on CPAP         Estimated body mass index is 30.27 kg/m as calculated from the following:   Height as of this encounter: 5' (1.524 m).   Weight as of this encounter: 70.3 kg.   PT/OT = Home health   DVT prophylaxis: SCD Code Status: Discussed with Family at bedside. Patient is DNR, LIMITED ADDITIONAL INTERVENTIONS: Use medication/IV fluids and cardiac monitoring as indicated; Do not use intubation or mechanical ventilation (DNI), also provide comfort medications.  Transfer to Progressive/Stepdown as indicated, avoid Intensive Care.  Family Communication:  Disposition Plan:  Status is: Inpatient Remains inpatient appropriate because: Waiting INR to be therapeutic      Subjective: Seen and examined at bedside, does not have any complaints this morning.   Examination:  General exam: Appears calm and comfortable  Respiratory system: Clear to auscultation. Respiratory effort normal. Cardiovascular system:  S1 & S2 heard, RRR. No JVD, murmurs, rubs, gallops or clicks. No pedal edema. Gastrointestinal system: Abdomen is nondistended, soft and nontender. No organomegaly or masses  felt. Normal bowel sounds heard. Central nervous system: Alert and oriented. No focal neurological deficits. Extremities: Symmetric 5 x 5 power. Skin: No rashes, lesions or ulcers Psychiatry: Judgement and insight appear poor.  Alert to name and place.  Foley catheter in place without any hematuria  Objective: Vitals:   04/13/22 1207 04/13/22 2004 04/14/22 0444 04/14/22 0841  BP: (!) 111/52 (!) 96/45 (!) 150/47   Pulse: (!) 58 (!) 54 (!) 48   Resp: 17     Temp: 98.9 F (37.2 C) 98.9 F (37.2 C) 97.9 F (36.6 C)   TempSrc: Oral Oral Oral   SpO2: 98% 97% 99% 98%  Weight:      Height:        Intake/Output Summary (Last 24 hours) at 04/14/2022 0852 Last data filed at 04/14/2022 0600 Gross per 24 hour  Intake 1749.05 ml  Output 1200 ml  Net 549.05 ml   Filed Weights   04/12/22 0333  Weight: 70.3 kg     Data Reviewed:   CBC: Recent Labs  Lab 04/12/22 0455 04/12/22 1400 04/13/22 0409 04/14/22 0421  WBC 5.6  --  6.7 5.3  NEUTROABS 4.7  --   --   --   HGB 11.4* 12.3* 11.1* 10.5*  HCT 35.3* 38.8* 35.0* 33.5*  MCV 92.9  --  94.3 94.9  PLT 112*  --  99* 94*   Basic Metabolic Panel: Recent Labs  Lab 04/12/22 0455 04/13/22 0409 04/14/22 0421  NA 130* 132* 133*  K 4.0 4.5 3.7  CL 101 104 105  CO2 22 22 20*  GLUCOSE 112* 101* 102*  BUN 27* 30* 28*  CREATININE 1.45* 1.68* 1.67*  CALCIUM 8.8* 8.7* 8.6*   GFR: Estimated Creatinine Clearance: 23.2 mL/min (A) (by C-G formula based on SCr of 1.67 mg/dL (H)). Liver Function Tests: Recent Labs  Lab 04/13/22 0409  AST 18  ALT 12  ALKPHOS 41  BILITOT 0.9  PROT 6.8  ALBUMIN 3.3*   No results for input(s): "LIPASE", "AMYLASE" in the last 168 hours. No results for input(s): "AMMONIA" in the last 168 hours. Coagulation Profile: Recent Labs  Lab 04/12/22 0505 04/13/22 0409 04/14/22 0421  INR 4.2* 2.6* 1.5*   Cardiac Enzymes: No results for input(s): "CKTOTAL", "CKMB", "CKMBINDEX", "TROPONINI" in the last 168  hours. BNP (last 3 results) No results for input(s): "PROBNP" in the last 8760 hours. HbA1C: No results for input(s): "HGBA1C" in the last 72 hours. CBG: No results for input(s): "GLUCAP" in the last 168 hours. Lipid Profile: No results for input(s): "CHOL", "HDL", "LDLCALC", "TRIG", "CHOLHDL", "LDLDIRECT" in the last 72 hours. Thyroid Function Tests: Recent Labs    04/14/22 0421  TSH 6.894*   Anemia Panel: Recent Labs    04/14/22 0421  VITAMINB12 1,198*   Sepsis Labs: No results for input(s): "PROCALCITON", "LATICACIDVEN" in the last 168 hours.  Recent Results (from the past 240 hour(s))  Urine Culture (for pregnant, neutropenic or urologic patients or patients with an indwelling urinary catheter)     Status: None   Collection Time: 04/12/22  8:08 AM   Specimen: Urine, Catheterized  Result Value Ref Range Status   Specimen Description   Final    URINE, CATHETERIZED Performed at [facility] [address] [place], [place] [address]    Special Requests  Final    NONE Performed at [facility], [place] [address] [place], [place] [address]    Culture   Final    NO GROWTH Performed at [facility], [place] [address] [place], [place] [address]    Report Status 04/13/2022 FINAL  Final         Radiology Studies: ECHOCARDIOGRAM COMPLETE  Result Date: 04/13/2022    ECHOCARDIOGRAM REPORT   Patient Name:   Tanner Ferguson Date of Exam: 04/13/2022 Medical Rec #:  [id number]    Height:       60.0 in Accession #:    [id number]   Weight:       155.0 lb Date of Birth:  02-21-1928    BSA:          1.675 m Patient Age:    63 years     BP:           111/50 mmHg Patient Gender: M            HR:           49 bpm. Exam Location:  Inpatient Procedure: 2D Echo, Cardiac Doppler, Color Doppler and Intracardiac            Opacification Agent Indications:    CHF/Atrial fibrillation  History:        Patient has prior history of Echocardiogram examinations,  most                 recent 11/08/2018. CHF, CAD and Previous Myocardial Infarction,                 Arrythmias:Atrial Fibrillation; Risk Factors:Hypertension and                 Dyslipidemia. Cancer.                 Aortic Valve: 25 mm St. Jude mechanical valve is present in the                 aortic position. Procedure Date: 1995.  Sonographer:    Eartha Inch Referring Phys: [id number] [place]  Sonographer Comments: Technically difficult study due to poor echo windows. Image acquisition challenging due to patient body habitus and Image acquisition challenging due to respiratory motion. IMPRESSIONS  1. Left ventricular ejection fraction, by estimation, is 55%. The left ventricle has low normal function. The left ventricle demonstrates regional wall motion abnormalities (see scoring diagram/findings for description). There is hypokinesis of the apical inferoseptal LV segment and apex that is best appreciated on definity imaging.There is severe hypertrophy of the basal septal segment. The rest of the LV segments demonstrate mild concentric left ventricular hypertrophy. Left ventricular diastolic  parameters are consistent with Grade I diastolic dysfunction (impaired relaxation).  2. Right ventricular systolic function is low normal. The right ventricular size is mildly enlarged.  3. Left atrial size was severely dilated.  4. Right atrial size was mildly dilated.  5. The mitral valve is grossly normal. Mild mitral valve regurgitation. No evidence of mitral stenosis.  6. The aortic valve has been repaired/replaced. There is a 25 mm St. Jude mechanical valve present in the aortic position. Procedure Date: 1995. Echo findings are consistent with normal structure and function of the aortic valve prosthesis. Aortic valve  mean gradient measures 5.5 mmHg. Aortic valve Vmax measures 1.56 m/s. DI 0.5. EOA 1.5cm2. Mild perivalvular leak.  7. Tricuspid valve regurgitation is moderate.  8. Aortic dilatation noted.  There is borderline  dilatation of the ascending aorta, measuring 38 mm. Comparison(s): Prior images reviewed side by side. Compared to prior TTE in 2020, EF appears slightly less robust with WMA as detailed above (prior TTE with EF 60%) with stable aortic valve gradients. FINDINGS  Left Ventricle: There is hypokinesis of the apical inferoseptal LV segment and apex (best appreciated on definity imaging). Left ventricular ejection fraction, by estimation, is 55%. The left ventricle has low normal function. The left ventricle demonstrates regional wall motion abnormalities. Definity contrast agent was given IV to delineate the left ventricular endocardial borders. The left ventricular internal cavity size was normal in size. There is severe hypertrophy of the basal septal segment. The rest of the LV segments demonstrate mild concentric left ventricular hypertrophy. Abnormal (paradoxical) septal motion consistent with post-operative status. Left ventricular diastolic parameters are consistent with Grade I diastolic dysfunction (impaired relaxation). Right Ventricle: The right ventricular size is mildly enlarged. No increase in right ventricular wall thickness. Right ventricular systolic function is low normal. Left Atrium: Left atrial size was severely dilated. Right Atrium: Right atrial size was mildly dilated. Pericardium: There is no evidence of pericardial effusion. Mitral Valve: The mitral valve is grossly normal. Mild mitral valve regurgitation. No evidence of mitral valve stenosis. MV peak gradient, 3.0 mmHg. The mean mitral valve gradient is 1.0 mmHg. Tricuspid Valve: The tricuspid valve is normal in structure. Tricuspid valve regurgitation is moderate. Aortic Valve: DI 0.5, EOA 1.6cm3. The aortic valve has been repaired/replaced. Aortic valve mean gradient measures 5.5 mmHg. Aortic valve peak gradient measures 9.7 mmHg. Aortic valve area, by VTI measures 1.61 cm. There is a 25 mm St. Jude mechanical valve  present in the aortic position. Procedure Date: 1995. Echo findings are consistent with normal structure and function of the aortic valve prosthesis. Pulmonic Valve: The pulmonic valve was normal in structure. Pulmonic valve regurgitation is not visualized. Aorta: Aortic dilatation noted. There is borderline dilatation of the ascending aorta, measuring 38 mm. IAS/Shunts: The atrial septum is grossly normal.  LEFT VENTRICLE PLAX 2D LVIDd:         4.60 cm     Diastology LVIDs:         3.40 cm     LV e' medial:    2.61 cm/s LV PW:         1.10 cm     LV E/e' medial:  24.1 LV IVS:        1.20 cm     LV e' lateral:   4.88 cm/s LVOT diam:     2.10 cm     LV E/e' lateral: 12.9 LV SV:         55 LV SV Index:   33 LVOT Area:     3.46 cm  LV Volumes (MOD) LV vol d, MOD A2C: 79.9 ml LV vol d, MOD A4C: 71.0 ml LV vol s, MOD A2C: 37.5 ml LV vol s, MOD A4C: 32.8 ml LV SV MOD A2C:     42.4 ml LV SV MOD A4C:     71.0 ml LV SV MOD BP:      39.4 ml RIGHT VENTRICLE RV S prime:     6.66 cm/s TAPSE (M-mode): 1.9 cm LEFT ATRIUM              Index        RIGHT ATRIUM           Index LA diam:        4.80 cm  2.87 cm/m  RA Area:     21.60 cm LA Vol (A2C):   102.0 ml 60.90 ml/m  RA Volume:   54.90 ml  32.78 ml/m LA Vol (A4C):   89.7 ml  53.55 ml/m LA Biplane Vol: 98.6 ml  58.87 ml/m  AORTIC VALVE AV Area (Vmax):    1.45 cm AV Area (Vmean):   1.39 cm AV Area (VTI):     1.61 cm AV Vmax:           155.50 cm/s AV Vmean:          110.500 cm/s AV VTI:            0.339 m AV Peak Grad:      9.7 mmHg AV Mean Grad:      5.5 mmHg LVOT Vmax:         65.30 cm/s LVOT Vmean:        44.400 cm/s LVOT VTI:          0.158 m LVOT/AV VTI ratio: 0.47  AORTA Ao Root diam: 3.60 cm Ao Asc diam:  3.80 cm MITRAL VALVE               TRICUSPID VALVE MV Area (PHT): 1.92 cm    TR Peak grad:   26.0 mmHg MV Peak grad:  3.0 mmHg    TR Mean grad:   19.0 mmHg MV Mean grad:  1.0 mmHg    TR Vmax:        255.00 cm/s MV Vmax:       0.86 m/s    TR Vmean:       209.0  cm/s MV Vmean:      50.2 cm/s MV Decel Time: 396 msec    SHUNTS MR Peak grad: 7.8 mmHg     Systemic VTI:  0.16 m MR Vmax:      139.47 cm/s  Systemic Diam: 2.10 cm MV E velocity: 63.00 cm/s MV A velocity: 83.60 cm/s MV E/A ratio:  0.75 Gwyndolyn Kaufman MD Electronically signed by Gwyndolyn Kaufman MD Signature Date/Time: 04/13/2022/10:44:40 AM    Final         Scheduled Meds:  acetaminophen  500 mg Oral TID   amiodarone  100 mg Oral Daily   cephALEXin  500 mg Oral Q8H   Chlorhexidine Gluconate Cloth  6 each Topical Daily   mirabegron ER  25 mg Oral Daily   mometasone-formoterol  2 puff Inhalation BID   rosuvastatin  20 mg Oral QPM   tamsulosin  0.4 mg Oral QPC supper   Warfarin - Pharmacist Dosing Inpatient   Does not apply q1600   Continuous Infusions:  lactated ringers 50 mL/hr at 04/13/22 2149     LOS: 2 days   Time spent= 35 mins    Prajna Vanderpool Arsenio Loader, MD [facility]  If 7PM-7AM, please contact night-coverage  04/14/2022, 8:52 AM

## 2022-04-15 DIAGNOSIS — R310 Gross hematuria: Secondary | ICD-10-CM | POA: Diagnosis not present

## 2022-04-15 LAB — PROTIME-INR
INR: 1.7 — ABNORMAL HIGH (ref 0.8–1.2)
Prothrombin Time: 20 s — ABNORMAL HIGH (ref 11.4–15.2)

## 2022-04-15 LAB — HEPARIN LEVEL (UNFRACTIONATED)
Heparin Unfractionated: 0.3 [IU]/mL (ref 0.30–0.70)
Heparin Unfractionated: 0.34 IU/mL (ref 0.30–0.70)

## 2022-04-15 LAB — CBC
HCT: 31.4 % — ABNORMAL LOW (ref 39.0–52.0)
Hemoglobin: 9.9 g/dL — ABNORMAL LOW (ref 13.0–17.0)
MCH: 29.7 pg (ref 26.0–34.0)
MCHC: 31.5 g/dL (ref 30.0–36.0)
MCV: 94.3 fL (ref 80.0–100.0)
Platelets: 103 10*3/uL — ABNORMAL LOW (ref 150–400)
RBC: 3.33 MIL/uL — ABNORMAL LOW (ref 4.22–5.81)
RDW: 14.9 % (ref 11.5–15.5)
WBC: 3.5 10*3/uL — ABNORMAL LOW (ref 4.0–10.5)
nRBC: 0 % (ref 0.0–0.2)

## 2022-04-15 LAB — BASIC METABOLIC PANEL WITH GFR
Anion gap: 6 (ref 5–15)
BUN: 34 mg/dL — ABNORMAL HIGH (ref 8–23)
CO2: 21 mmol/L — ABNORMAL LOW (ref 22–32)
Calcium: 8.6 mg/dL — ABNORMAL LOW (ref 8.9–10.3)
Chloride: 108 mmol/L (ref 98–111)
Creatinine, Ser: 1.46 mg/dL — ABNORMAL HIGH (ref 0.61–1.24)
GFR, Estimated: 45 mL/min — ABNORMAL LOW
Glucose, Bld: 106 mg/dL — ABNORMAL HIGH (ref 70–99)
Potassium: 3.7 mmol/L (ref 3.5–5.1)
Sodium: 135 mmol/L (ref 135–145)

## 2022-04-15 LAB — MAGNESIUM: Magnesium: 2 mg/dL (ref 1.7–2.4)

## 2022-04-15 MED ORDER — ORAL CARE MOUTH RINSE: 15.0000 mL | OROMUCOSAL | Status: DC | PRN

## 2022-04-15 MED ORDER — WARFARIN SODIUM 4 MG PO TABS
4.0000 mg | ORAL_TABLET | Freq: Once | ORAL | Status: AC
  Administered 2022-04-15: 4 mg via ORAL
  Filled 2022-04-15: qty 1

## 2022-04-15 NOTE — Progress Notes (Signed)
[place] for warfarin and heparin bridge Indication: atrial fibrillation, mechanical aortic valve   Allergies  Allergen Reactions   Penicillins Rash    Patient Measurements: Height: 5' (152.4 cm) Weight: 70.3 kg (155 lb) IBW/kg (Calculated) : 50   Vital Signs: Temp: 98.2 F (36.8 C) (02/14 2042) Temp Source: Oral (02/14 2042) BP: 131/83 (02/14 2042) Pulse Rate: 63 (02/14 2047)  Labs: Recent Labs    04/12/22 0455 04/12/22 0505 04/12/22 1400 04/13/22 0409 04/14/22 0421 04/14/22 1732 04/15/22 0224  HGB 11.4*  --  12.3* 11.1* 10.5*  --   --   HCT 35.3*  --  38.8* 35.0* 33.5*  --   --   PLT 112*  --   --  99* 94*  --   --   LABPROT  --  39.9*  --  27.2* 18.3*  --   --   INR  --  4.2*  --  2.6* 1.5*  --   --   HEPARINUNFRC  --   --   --   --   --  0.26* 0.30  CREATININE 1.45*  --   --  1.68* 1.67*  --   --      Estimated Creatinine Clearance: 23.2 mL/min (A) (by C-G formula based on SCr of 1.67 mg/dL (H)).   Medical History: Past Medical History:  Diagnosis Date   Asthma    CAD (coronary artery disease)    s/p NSTEMI 9/19. Cath in [place], [place] 10/19. Severe CAD not amenable to PCI. Not CABG candidate   CHF (congestive heart failure) (HCC)    EF 40-45% by echo due to iCM. EF 36% by Myoview 9/19   Dementia (HCC)    Essential tremor    Hyperlipidemia    Intolerant of statins   Hypertension    PAF (paroxysmal atrial fibrillation) ([place])    Pancytopenia ([place])    Follows with hematology   Prostate cancer ([place])    Seizure ([place])     Assessment: Tanner Ferguson is a 89 y.o. male with history of dementia, CHF, history of pancytopenia, prostate cancer, seizure disorder, CAD, history NSTEMI, paroxysmal atrial fibrillation, mechanical aortic valve on warfarin who presented to the emergency department with complaints of Foley catheter malfunction and hematuria.  Home warfarin regimen: 2.5 mg Sun, Tue, Thur and 1.25 mg Mon, Wed, Fri,  Sat Patient/family check INR at home and report value to anti-coag clinic. From previous notes, it appears patient is stable on current dose of warfarin.  INR supratherapeutic on admission - concerning in setting of hematuria prior to admission. Patient and family report no dietary or medication changes, no obvious reason for elevated INR. Warfarin held and patient given Vit K 3 mg subcu 2/12 with subsequent decline in INR.   04/15/2022: -Heparin level 0.3- now therapeutic at low end of goal on IV heparin 1100 units/hr -RN notes patient still with blood tinged urine that is unchanged.  No new bleeding noted.   -CBC has drifted down 2/14, but relatively stable No infusion interruptions per RN report   Goal of Therapy:  INR 2.5-3.5 Heparin level 0.3-0.7 Monitor platelets by anticoagulation protocol: Yes   Plan:  -Increase Heparin drip slightly to 1150 units/hr -Check confirmatory heparin level ~ 8 hrs after rate increased -Continue to monitor for signs/symptoms of bleeding, especially return of hematuria -Daily heparin level, CBC, INR   Netta Cedars, PharmD, BCPS 04/15/2022 3:16 AM

## 2022-04-15 NOTE — Progress Notes (Signed)
[place] for warfarin and heparin bridge Indication: atrial fibrillation, mechanical aortic valve   Allergies  Allergen Reactions   Penicillins Rash    Patient Measurements: Height: 5' (152.4 cm) Weight: 70.3 kg (155 lb) IBW/kg (Calculated) : 50 DW: 64 kg   Vital Signs: Temp: 97.6 F (36.4 C) (02/15 0513) Temp Source: Oral (02/15 0513) BP: 153/60 (02/15 0513) Pulse Rate: 48 (02/15 0513)  Labs: Recent Labs    04/13/22 0409 04/14/22 0421 04/14/22 1732 04/15/22 0224 04/15/22 0358 04/15/22 1158  HGB 11.1* 10.5*  --   --  9.9*  --   HCT 35.0* 33.5*  --   --  31.4*  --   PLT 99* 94*  --   --  103*  --   LABPROT 27.2* 18.3*  --   --   --  20.0*  INR 2.6* 1.5*  --   --   --  1.7*  HEPARINUNFRC  --   --  0.26* 0.30  --  0.34  CREATININE 1.68* 1.67*  --   --  1.46*  --      Estimated Creatinine Clearance: 26.5 mL/min (A) (by C-G formula based on SCr of 1.46 mg/dL (H)).   Medical History: Past Medical History:  Diagnosis Date   Asthma    CAD (coronary artery disease)    s/p NSTEMI 9/19. Cath in [place], [place] 10/19. Severe CAD not amenable to PCI. Not CABG candidate   CHF (congestive heart failure) (HCC)    EF 40-45% by echo due to iCM. EF 36% by Myoview 9/19   Dementia (HCC)    Essential tremor    Hyperlipidemia    Intolerant of statins   Hypertension    PAF (paroxysmal atrial fibrillation) ([place])    Pancytopenia ([place])    Follows with hematology   Prostate cancer ([place])    Seizure ([place])     Assessment: Tanner Ferguson is a 89 y.o. male with history of dementia, CHF, history of pancytopenia, prostate cancer, seizure disorder, CAD, history NSTEMI, paroxysmal atrial fibrillation, mechanical aortic valve on warfarin who presented to the emergency department with complaints of Foley catheter malfunction and hematuria.  Home warfarin regimen: 2.5 mg Sun, Tue, Thur and 1.25 mg Mon, Wed, Fri, Sat Patient/family check INR at home and  report value to anti-coag clinic. From previous notes, it appears patient is stable on current dose of warfarin.  INR supratherapeutic on admission - concerning in setting of hematuria prior to admission. Patient and family report no dietary or medication changes, no obvious reason for elevated INR. Warfarin held and patient given Vit K 3 mg subcu 2/12 with subsequent decline in INR.   Today, 04/15/2022: -Heparin level 0.34- therapeutic on IV heparin 1150 units/hr - INR: 1.7, subtherapeutic -CBC has drifted down 2/14, but relatively stable   Goal of Therapy:  INR 2.5-3.5 Heparin level 0.3-0.7 Monitor platelets by anticoagulation protocol: Yes   Plan:  -Continue Heparin at 1150 units/hr Warfarin 5m x1 today in setting of subtherapeutic INR -Continue to monitor for signs/symptoms of bleeding, especially return of hematuria -Daily heparin level, CBC, INR   AMendel Ryder PharmD Clinical Pharmacist 04/15/2022 1:20 PM

## 2022-04-15 NOTE — Progress Notes (Signed)
Physical Therapy Treatment Patient Details Name: Tanner Ferguson MRN: [id number] DOB: 08/04/1927 Today's Date: 04/15/2022   History of Present Illness 89 year old admitted with for hematuria.  PMHsignificant for asthma, dementia, essential tremor, hyperlipidemia, hypertension, history of pancytopenia, prostate cancer, seizure disorder, CAD, history of non-STEMI, history of systolic heart failure ejection fraction 36% on Myoview stress test September 2019, recovered ejection fraction of 60% on 2020 by echo, paroxysmal A-fib, mechanical aortic valve.    PT Comments    Pt assisted with ambulating in hallway and utilized RW.  Pt's son present, and pt and son report pt is close to baseline mobility at this time.  Pt hoping to d/c home soon.    Recommendations for follow up therapy are one component of a multi-disciplinary discharge planning process, led by the attending physician.  Recommendations may be updated based on patient status, additional functional criteria and insurance authorization.  Follow Up Recommendations  Home health PT (per family decision)     Assistance Recommended at Discharge Frequent or constant Supervision/Assistance  Patient can return home with the following A little help with walking and/or transfers;A little help with bathing/dressing/bathroom;Assistance with cooking/housework;Assist for transportation;Help with stairs or ramp for entrance   Equipment Recommendations  None recommended by PT    Recommendations for Other Services       Precautions / Restrictions Precautions Precautions: Fall Precaution Comments: essential tremor     Mobility  Bed Mobility Overal bed mobility: Needs Assistance Bed Mobility: Supine to Sit     Supine to sit: Supervision, HOB elevated     General bed mobility comments: Increased time. Effortful especially with tremor. No external assistance required however    Transfers Overall transfer level: Needs assistance Equipment  used: Rolling walker (2 wheels) Transfers: Sit to/from Stand Sit to Stand: Min guard           General transfer comment: min/guard for safety    Ambulation/Gait Ambulation/Gait assistance: Min guard Gait Distance (Feet): 350 Feet Assistive device: Rolling walker (2 wheels) Gait Pattern/deviations: Step-through pattern, Decreased stride length       General Gait Details: tremor in UEs making RW a bit effortful however pt required no physical assist, remained min/guard for safety; pt denies symptoms, HR in upper 80s with ambulation ( HR 45 bpm prior to session and 58 bpm post session)   Stairs             Wheelchair Mobility    Modified Rankin (Stroke Patients Only)       Balance Overall balance assessment: Mild deficits observed, not formally tested                                          Cognition Arousal/Alertness: Awake/alert Behavior During Therapy: WFL for tasks assessed/performed Overall Cognitive Status: History of cognitive impairments - at baseline                                          Exercises      General Comments        Pertinent Vitals/Pain Pain Assessment Pain Assessment: No/denies pain    Home Living                          Prior Function  PT Goals (current goals can now be found in the care plan section) Progress towards PT goals: Progressing toward goals    Frequency    Min 3X/week      PT Plan Current plan remains appropriate    Co-evaluation              AM-PAC PT "6 Clicks" Mobility   Outcome Measure  Help needed turning from your back to your side while in a flat bed without using bedrails?: A Little Help needed moving from lying on your back to sitting on the side of a flat bed without using bedrails?: A Little Help needed moving to and from a bed to a chair (including a wheelchair)?: A Little Help needed standing up from a chair using your  arms (e.g., wheelchair or bedside chair)?: A Little Help needed to walk in hospital room?: A Little Help needed climbing 3-5 steps with a railing? : A Little 6 Click Score: 18    End of Session Equipment Utilized During Treatment: Gait belt Activity Tolerance: Patient tolerated treatment well Patient left: with call bell/phone within reach;with family/visitor present;in chair;with chair alarm set   PT Visit Diagnosis: Muscle weakness (generalized) (M62.81);Difficulty in walking, not elsewhere classified (R26.2)     Time: [id number] PT Time Calculation (min) (ACUTE ONLY): 18 min  Charges:  $Gait Training: 8-22 mins                    Jannette Spanner PT, DPT Physical Therapist Acute Rehabilitation Services Preferred contact method: Secure Chat Weekend Pager Only: [phone] Office: [place] 04/15/2022, 4:13 PM

## 2022-04-15 NOTE — Care Management Important Message (Signed)
Important Message  Patient Details IM Letter given Name: Tanner Ferguson MRN: [id number] Date of Birth: 1927/10/29   Medicare Important Message Given:  Yes     [name] 04/15/2022, 12:44 PM

## 2022-04-15 NOTE — Progress Notes (Signed)
PROGRESS NOTE    Tanner Ferguson  [id number] DOB: April 25, 1927 DOA: 04/12/2022 PCP: Wenda Low, MD   Brief Narrative:  89 year old with past medical history significant for asthma, dementia, essential tremor, hyperlipidemia, hypertension, history of pancytopenia, prostate cancer 10 years ago status post brachytherapy, seizure disorder, CAD, history of non-STEMI, history of systolic heart failure ejection fraction 36% on Myoview stress test September 2019, recovered ejection fraction of 60% on 2020 by echo, paroxysmal A-fib, mechanical aortic valve on warfarin who presents to the ED complaining of Foley catheter malfunction and hematuria. UA with large hemoglobinuria, moderate leukocyte esterase, proteinuria, more than 50 red blood cells more than 50 white blood cells, rare bacteria.  INR 4.2, platelets 112.  CT renal protocol with no acute abdominal pelvic pathology.  Diverticulosis no diverticulitis.  Haziness of the mesenteric fat as can be seen with mesenteric panniculitis or can be idiopathic.  Bilateral calcified pleural plaques.    Assessment & Plan:  Principal Problem:   Hematuria Active Problems:   Atrial fibrillation (HCC)   Coronary artery disease   History of mechanical aortic valve replacement   Chronic systolic heart failure (HCC)   Hyperlipidemia   Essential tremor   Hypertension   Dementia (HCC)   Hyponatremia   Thrombocytopenia (HCC)   OSA on CPAP   Stage 3b chronic kidney disease (CKD) (HCC)   Normocytic anemia   Supratherapeutic INR    Hematuria in the setting of supratherapeutic INR,  Unclear etiology of initial hematuria possibilities prior radiation, infection, malignancy.  Seen by urology Dr Felipa Eth, recommending continuing Foley catheter at least for 1 week and follow-up outpatient.  Myrbetriq 25 mg daily, Pyridium 200 mg 3 times daily as needed for bladder discomfort.  May need outpatient cystoscopy.  Monitor INR. On Keflex, complete 7-day course Some  bleeding around urethral meatus, no hematuria. Would avoid stopping AC due to his mechanical valve.    Supra-therapeutic INR;  Now subtherapeutic INR, pending today. On heparin drip    General Weakness;  Suspect from acute illness.  TSH and fT4 slightly elevated. Repeat Outpatient in 4-6 weeks. B12 is normal.    Normocytic anemia: Hb stable. 11   A-fib: History of mechanical aortic valve replacement Coumadin resumed.  Goal INR 2.5-3.5. INR is 1.7   Coronary artery disease: Currently chest pain-free.  On aspirin and statin   CKD stage IIIb Creatinine around baseline of 1.6   Chronic systolic heart failure Echocardiogram showed preserved EF of 55%.   Hyperlipidemia: On Crestor.    History of hypertension; PRN Hydralazine.    Dementia: Acute Delirium; Delirium precaution.  Supportive care.    Hyponatremia: Monitor.    Thrombocytopenia, chronic He has been previous evaluated by hematology, thought related to Bone marrow suppression.  Stable.    OSA on CPAP         Estimated body mass index is 30.27 kg/m as calculated from the following:   Height as of this encounter: 5' (1.524 m).   Weight as of this encounter: 70.3 kg.   PT/OT = Home health   DVT prophylaxis: SCD Code Status: Discussed with Family at bedside. Patient is DNR, LIMITED ADDITIONAL INTERVENTIONS: Use medication/IV fluids and cardiac monitoring as indicated; Do not use intubation or mechanical ventilation (DNI), also provide comfort medications.  Transfer to Progressive/Stepdown as indicated, avoid Intensive Care.  Family Communication:  Disposition Plan:  Status is: Inpatient Remains inpatient appropriate because: Waiting INR to be therapeutic. Eventually Home with HH      Subjective: Seen  and examined at bedside, no complaints. Examination:  Constitutional: Not in acute distress, elderly frail Respiratory: Clear to auscultation bilaterally Cardiovascular: Normal sinus rhythm, no  rubs Abdomen: Nontender nondistended good bowel sounds Musculoskeletal: No edema noted Skin: No rashes seen Neurologic: CN 2-12 grossly intact.  And nonfocal Psychiatric: Normal judgment and insight. Alert and oriented x 3. Normal mood.    Foley catheter in place without any hematuria  Objective: Vitals:   04/14/22 2042 04/14/22 2047 04/15/22 0513 04/15/22 0809  BP: 131/83  (!) 153/60   Pulse: (!) 57 63 (!) 48   Resp: 18 (!) 21 18   Temp: 98.2 F (36.8 C)  97.6 F (36.4 C)   TempSrc: Oral  Oral   SpO2: 99% 96% 98% 96%  Weight:      Height:        Intake/Output Summary (Last 24 hours) at 04/15/2022 0815 Last data filed at 04/15/2022 0546 Gross per 24 hour  Intake 450.21 ml  Output 800 ml  Net -349.79 ml   Filed Weights   04/12/22 0333  Weight: 70.3 kg     Data Reviewed:   CBC: Recent Labs  Lab 04/12/22 0455 04/12/22 1400 04/13/22 0409 04/14/22 0421 04/15/22 0358  WBC 5.6  --  6.7 5.3 3.5*  NEUTROABS 4.7  --   --   --   --   HGB 11.4* 12.3* 11.1* 10.5* 9.9*  HCT 35.3* 38.8* 35.0* 33.5* 31.4*  MCV 92.9  --  94.3 94.9 94.3  PLT 112*  --  99* 94* [id number]*   Basic Metabolic Panel: Recent Labs  Lab 04/12/22 0455 04/13/22 0409 04/14/22 0421 04/15/22 0358  NA 130* 132* 133* 135  K 4.0 4.5 3.7 3.7  CL 101 104 105 108  CO2 22 22 20* 21*  GLUCOSE 112* 101* 102* 106*  BUN 27* 30* 28* 34*  CREATININE 1.45* 1.68* 1.67* 1.46*  CALCIUM 8.8* 8.7* 8.6* 8.6*  MG  --   --   --  2.0   GFR: Estimated Creatinine Clearance: 26.5 mL/min (A) (by C-G formula based on SCr of 1.46 mg/dL (H)). Liver Function Tests: Recent Labs  Lab 04/13/22 0409  AST 18  ALT 12  ALKPHOS 41  BILITOT 0.9  PROT 6.8  ALBUMIN 3.3*   No results for input(s): "LIPASE", "AMYLASE" in the last 168 hours. No results for input(s): "AMMONIA" in the last 168 hours. Coagulation Profile: Recent Labs  Lab 04/12/22 0505 04/13/22 0409 04/14/22 0421  INR 4.2* 2.6* 1.5*   Cardiac Enzymes: No  results for input(s): "CKTOTAL", "CKMB", "CKMBINDEX", "TROPONINI" in the last 168 hours. BNP (last 3 results) No results for input(s): "PROBNP" in the last 8760 hours. HbA1C: No results for input(s): "HGBA1C" in the last 72 hours. CBG: No results for input(s): "GLUCAP" in the last 168 hours. Lipid Profile: No results for input(s): "CHOL", "HDL", "LDLCALC", "TRIG", "CHOLHDL", "LDLDIRECT" in the last 72 hours. Thyroid Function Tests: Recent Labs    04/14/22 0420 04/14/22 0421  TSH  --  6.894*  FREET4 1.33*  --    Anemia Panel: Recent Labs    04/14/22 0421  VITAMINB12 1,198*   Sepsis Labs: No results for input(s): "PROCALCITON", "LATICACIDVEN" in the last 168 hours.  Recent Results (from the past 240 hour(s))  Urine Culture (for pregnant, neutropenic or urologic patients or patients with an indwelling urinary catheter)     Status: None   Collection Time: 04/12/22  8:08 AM   Specimen: Urine, Catheterized  Result Value Ref  Range Status   Specimen Description   Final    URINE, CATHETERIZED Performed at [place] [address] [place], [place] [address]    Special Requests   Final    NONE Performed at [facility], [place] [address], [place], [place] [address]    Culture   Final    NO GROWTH Performed at [facility], [place] [address], [place], [place] [address]    Report Status 04/13/2022 FINAL  Final         Radiology Studies: ECHOCARDIOGRAM COMPLETE  Result Date: 04/13/2022    ECHOCARDIOGRAM REPORT   Patient Name:   LOYED VANDRUNEN Date of Exam: 04/13/2022 Medical Rec #:  [id number]    Height:       60.0 in Accession #:    [id number]   Weight:       155.0 lb Date of Birth:  Sep 18, 1927    BSA:          1.675 m Patient Age:    49 years     BP:           111/50 mmHg Patient Gender: M            HR:           49 bpm. Exam Location:  Inpatient Procedure: 2D Echo, Cardiac Doppler, Color Doppler and Intracardiac             Opacification Agent Indications:    CHF/Atrial fibrillation  History:        Patient has prior history of Echocardiogram examinations, most                 recent 11/08/2018. CHF, CAD and Previous Myocardial Infarction,                 Arrythmias:Atrial Fibrillation; Risk Factors:Hypertension and                 Dyslipidemia. Cancer.                 Aortic Valve: 25 mm St. Jude mechanical valve is present in the                 aortic position. Procedure Date: 1995.  Sonographer:    Eartha Inch Referring Phys: [id number] [place]  Sonographer Comments: Technically difficult study due to poor echo windows. Image acquisition challenging due to patient body habitus and Image acquisition challenging due to respiratory motion. IMPRESSIONS  1. Left ventricular ejection fraction, by estimation, is 55%. The left ventricle has low normal function. The left ventricle demonstrates regional wall motion abnormalities (see scoring diagram/findings for description). There is hypokinesis of the apical inferoseptal LV segment and apex that is best appreciated on definity imaging.There is severe hypertrophy of the basal septal segment. The rest of the LV segments demonstrate mild concentric left ventricular hypertrophy. Left ventricular diastolic  parameters are consistent with Grade I diastolic dysfunction (impaired relaxation).  2. Right ventricular systolic function is low normal. The right ventricular size is mildly enlarged.  3. Left atrial size was severely dilated.  4. Right atrial size was mildly dilated.  5. The mitral valve is grossly normal. Mild mitral valve regurgitation. No evidence of mitral stenosis.  6. The aortic valve has been repaired/replaced. There is a 25 mm St. Jude mechanical valve present in the aortic position. Procedure Date: 1995. Echo findings are consistent with normal structure and function of the aortic valve prosthesis. Aortic valve  mean gradient measures 5.5 mmHg. Aortic valve Vmax  measures 1.56 m/s. DI 0.5. EOA 1.5cm2. Mild perivalvular leak.  7. Tricuspid valve regurgitation is moderate.  8. Aortic dilatation noted. There is borderline dilatation of the ascending aorta, measuring 38 mm. Comparison(s): Prior images reviewed side by side. Compared to prior TTE in 2020, EF appears slightly less robust with WMA as detailed above (prior TTE with EF 60%) with stable aortic valve gradients. FINDINGS  Left Ventricle: There is hypokinesis of the apical inferoseptal LV segment and apex (best appreciated on definity imaging). Left ventricular ejection fraction, by estimation, is 55%. The left ventricle has low normal function. The left ventricle demonstrates regional wall motion abnormalities. Definity contrast agent was given IV to delineate the left ventricular endocardial borders. The left ventricular internal cavity size was normal in size. There is severe hypertrophy of the basal septal segment. The rest of the LV segments demonstrate mild concentric left ventricular hypertrophy. Abnormal (paradoxical) septal motion consistent with post-operative status. Left ventricular diastolic parameters are consistent with Grade I diastolic dysfunction (impaired relaxation). Right Ventricle: The right ventricular size is mildly enlarged. No increase in right ventricular wall thickness. Right ventricular systolic function is low normal. Left Atrium: Left atrial size was severely dilated. Right Atrium: Right atrial size was mildly dilated. Pericardium: There is no evidence of pericardial effusion. Mitral Valve: The mitral valve is grossly normal. Mild mitral valve regurgitation. No evidence of mitral valve stenosis. MV peak gradient, 3.0 mmHg. The mean mitral valve gradient is 1.0 mmHg. Tricuspid Valve: The tricuspid valve is normal in structure. Tricuspid valve regurgitation is moderate. Aortic Valve: DI 0.5, EOA 1.6cm3. The aortic valve has been repaired/replaced. Aortic valve mean gradient measures 5.5 mmHg.  Aortic valve peak gradient measures 9.7 mmHg. Aortic valve area, by VTI measures 1.61 cm. There is a 25 mm St. Jude mechanical valve present in the aortic position. Procedure Date: 1995. Echo findings are consistent with normal structure and function of the aortic valve prosthesis. Pulmonic Valve: The pulmonic valve was normal in structure. Pulmonic valve regurgitation is not visualized. Aorta: Aortic dilatation noted. There is borderline dilatation of the ascending aorta, measuring 38 mm. IAS/Shunts: The atrial septum is grossly normal.  LEFT VENTRICLE PLAX 2D LVIDd:         4.60 cm     Diastology LVIDs:         3.40 cm     LV e' medial:    2.61 cm/s LV PW:         1.10 cm     LV E/e' medial:  24.1 LV IVS:        1.20 cm     LV e' lateral:   4.88 cm/s LVOT diam:     2.10 cm     LV E/e' lateral: 12.9 LV SV:         55 LV SV Index:   33 LVOT Area:     3.46 cm  LV Volumes (MOD) LV vol d, MOD A2C: 79.9 ml LV vol d, MOD A4C: 71.0 ml LV vol s, MOD A2C: 37.5 ml LV vol s, MOD A4C: 32.8 ml LV SV MOD A2C:     42.4 ml LV SV MOD A4C:     71.0 ml LV SV MOD BP:      39.4 ml RIGHT VENTRICLE RV S prime:     6.66 cm/s TAPSE (M-mode): 1.9 cm LEFT ATRIUM              Index  RIGHT ATRIUM           Index LA diam:        4.80 cm  2.87 cm/m   RA Area:     21.60 cm LA Vol (A2C):   102.0 ml 60.90 ml/m  RA Volume:   54.90 ml  32.78 ml/m LA Vol (A4C):   89.7 ml  53.55 ml/m LA Biplane Vol: 98.6 ml  58.87 ml/m  AORTIC VALVE AV Area (Vmax):    1.45 cm AV Area (Vmean):   1.39 cm AV Area (VTI):     1.61 cm AV Vmax:           155.50 cm/s AV Vmean:          110.500 cm/s AV VTI:            0.339 m AV Peak Grad:      9.7 mmHg AV Mean Grad:      5.5 mmHg LVOT Vmax:         65.30 cm/s LVOT Vmean:        44.400 cm/s LVOT VTI:          0.158 m LVOT/AV VTI ratio: 0.47  AORTA Ao Root diam: 3.60 cm Ao Asc diam:  3.80 cm MITRAL VALVE               TRICUSPID VALVE MV Area (PHT): 1.92 cm    TR Peak grad:   26.0 mmHg MV Peak grad:  3.0 mmHg     TR Mean grad:   19.0 mmHg MV Mean grad:  1.0 mmHg    TR Vmax:        255.00 cm/s MV Vmax:       0.86 m/s    TR Vmean:       209.0 cm/s MV Vmean:      50.2 cm/s MV Decel Time: 396 msec    SHUNTS MR Peak grad: 7.8 mmHg     Systemic VTI:  0.16 m MR Vmax:      139.47 cm/s  Systemic Diam: 2.10 cm MV E velocity: 63.00 cm/s MV A velocity: 83.60 cm/s MV E/A ratio:  0.75 Gwyndolyn Kaufman MD Electronically signed by Gwyndolyn Kaufman MD Signature Date/Time: 04/13/2022/10:44:40 AM    Final         Scheduled Meds:  acetaminophen  500 mg Oral TID   amiodarone  100 mg Oral Daily   cephALEXin  500 mg Oral Q8H   Chlorhexidine Gluconate Cloth  6 each Topical Daily   mirabegron ER  25 mg Oral Daily   mometasone-formoterol  2 puff Inhalation BID   rosuvastatin  20 mg Oral QPM   tamsulosin  0.4 mg Oral QPC supper   Warfarin - Pharmacist Dosing Inpatient   Does not apply q1600   Continuous Infusions:  heparin 1,150 Units/hr (04/15/22 0347)     LOS: 3 days   Time spent= 35 mins    Lorynn Moeser Arsenio Loader, MD [facility]  If 7PM-7AM, please contact night-coverage  04/15/2022, 8:15 AM

## 2022-04-15 NOTE — Plan of Care (Signed)

## 2022-04-16 DIAGNOSIS — R310 Gross hematuria: Secondary | ICD-10-CM | POA: Diagnosis not present

## 2022-04-16 LAB — CBC
HCT: 31.5 % — ABNORMAL LOW (ref 39.0–52.0)
Hemoglobin: 9.9 g/dL — ABNORMAL LOW (ref 13.0–17.0)
MCH: 30.1 pg (ref 26.0–34.0)
MCHC: 31.4 g/dL (ref 30.0–36.0)
MCV: 95.7 fL (ref 80.0–100.0)
Platelets: 101 10*3/uL — ABNORMAL LOW (ref 150–400)
RBC: 3.29 MIL/uL — ABNORMAL LOW (ref 4.22–5.81)
RDW: 15 % (ref 11.5–15.5)
WBC: 3.5 10*3/uL — ABNORMAL LOW (ref 4.0–10.5)
nRBC: 0 % (ref 0.0–0.2)

## 2022-04-16 LAB — PROTIME-INR
INR: 1.8 — ABNORMAL HIGH (ref 0.8–1.2)
Prothrombin Time: 20.6 seconds — ABNORMAL HIGH (ref 11.4–15.2)

## 2022-04-16 LAB — MAGNESIUM: Magnesium: 2 mg/dL (ref 1.7–2.4)

## 2022-04-16 LAB — BASIC METABOLIC PANEL
Anion gap: 7 (ref 5–15)
BUN: 26 mg/dL — ABNORMAL HIGH (ref 8–23)
CO2: 24 mmol/L (ref 22–32)
Calcium: 8.5 mg/dL — ABNORMAL LOW (ref 8.9–10.3)
Chloride: 107 mmol/L (ref 98–111)
Creatinine, Ser: 1.68 mg/dL — ABNORMAL HIGH (ref 0.61–1.24)
GFR, Estimated: 38 mL/min — ABNORMAL LOW (ref >60)
Glucose, Bld: 101 mg/dL — ABNORMAL HIGH (ref 70–99)
Potassium: 4.2 mmol/L (ref 3.5–5.1)
Sodium: 138 mmol/L (ref 135–145)

## 2022-04-16 LAB — MRSA NEXT GEN BY PCR, NASAL: MRSA by PCR Next Gen: NOT DETECTED

## 2022-04-16 LAB — HEPARIN LEVEL (UNFRACTIONATED): Heparin Unfractionated: 0.39 IU/mL (ref 0.30–0.70)

## 2022-04-16 MED ORDER — WARFARIN SODIUM 4 MG PO TABS
4.0000 mg | ORAL_TABLET | Freq: Once | ORAL | Status: AC
  Administered 2022-04-16: 4 mg via ORAL
  Filled 2022-04-16: qty 1

## 2022-04-16 MED ORDER — ENOXAPARIN SODIUM 80 MG/0.8ML IJ SOSY
1.0000 mg/kg | PREFILLED_SYRINGE | INTRAMUSCULAR | Status: DC
  Administered 2022-04-16 – 2022-04-17 (×2): 70 mg via SUBCUTANEOUS
  Filled 2022-04-16 (×3): qty 0.8

## 2022-04-16 NOTE — Progress Notes (Signed)
PROGRESS NOTE    Tanner Ferguson  [id number] DOB: 1927-06-13 DOA: 04/12/2022 PCP: Wenda Low, MD   Brief Narrative:  89 year old with past medical history significant for asthma, dementia, essential tremor, hyperlipidemia, hypertension, history of pancytopenia, prostate cancer 10 years ago status post brachytherapy, seizure disorder, CAD, history of non-STEMI, history of systolic heart failure ejection fraction 36% on Myoview stress test September 2019, recovered ejection fraction of 60% on 2020 by echo, paroxysmal A-fib, mechanical aortic valve on warfarin who presents to the ED complaining of Foley catheter malfunction and hematuria. UA with large hemoglobinuria, moderate leukocyte esterase, proteinuria, more than 50 red blood cells more than 50 white blood cells, rare bacteria.  INR 4.2, platelets 112.  CT renal protocol with no acute abdominal pelvic pathology.  Diverticulosis no diverticulitis.  Haziness of the mesenteric fat as can be seen with mesenteric panniculitis or can be idiopathic.  Bilateral calcified pleural plaques.    Assessment & Plan:  Principal Problem:   Hematuria Active Problems:   Atrial fibrillation (HCC)   Coronary artery disease   History of mechanical aortic valve replacement   Chronic systolic heart failure (HCC)   Hyperlipidemia   Essential tremor   Hypertension   Dementia (HCC)   Hyponatremia   Thrombocytopenia (HCC)   OSA on CPAP   Stage 3b chronic kidney disease (CKD) (HCC)   Normocytic anemia   Supratherapeutic INR    Hematuria in the setting of supratherapeutic INR,  Unclear etiology of initial hematuria possibilities prior radiation, infection, malignancy.  Seen by urology Dr Felipa Eth, recommending continuing Foley catheter at least for 1 week and follow-up outpatient.  Myrbetriq 25 mg daily, Pyridium 200 mg 3 times daily as needed for bladder discomfort.  May need outpatient cystoscopy.  Monitor INR. On Keflex, complete 7-day course Some  bleeding around urethral meatus, no hematuria. Would avoid stopping AC due to his mechanical valve.    Supra-therapeutic INR;  Now subtherapeutic INR- still 1.8.  Change heparin to Lovenox for bridging   General Weakness;  Suspect from acute illness.  TSH and fT4 slightly elevated. Repeat Outpatient in 4-6 weeks. B12 is normal.    Normocytic anemia: Hb stable. 11   A-fib: History of mechanical aortic valve replacement (1995) Coumadin resumed.  Goal INR 2.5-3.5. INR is 1.7   Coronary artery disease: Currently chest pain-free.  On aspirin and statin   CKD stage IIIb Creatinine around baseline of 1.6   Chronic systolic heart failure Echocardiogram showed preserved EF of 55%.   Hyperlipidemia: On Crestor.    History of hypertension; PRN Hydralazine.    Dementia: Acute Delirium; Delirium precaution.  Supportive care.    Hyponatremia: Monitor.    Thrombocytopenia, chronic He has been previous evaluated by hematology, thought related to Bone marrow suppression.  Stable.    OSA on CPAP         Estimated body mass index is 30.27 kg/m as calculated from the following:   Height as of this encounter: 5' (1.524 m).   Weight as of this encounter: 70.3 kg.   PT/OT = Home health   DVT prophylaxis: SCD Code Status: DNR/DNI Family Communication: Son at bedside Disposition Plan:  Status is: Inpatient Remains inpatient appropriate because:    Subjective: Seen and examined at bedside, no complaints Examination: Constitutional: Not in acute distress Respiratory: Clear to auscultation bilaterally Cardiovascular: Normal sinus rhythm, no rubs Abdomen: Nontender nondistended good bowel sounds Musculoskeletal: No edema noted Skin: No rashes seen Neurologic: CN 2-12 grossly intact.  And nonfocal  Psychiatric: Normal judgment and insight. Alert and oriented x 3. Normal mood.   Foley catheter in place without any hematuria  Objective: Vitals:   04/16/22 0208 04/16/22  0216 04/16/22 0545 04/16/22 0739  BP: (!) 147/59 (!) 147/59 (!) 149/59   Pulse: (!) 45 (!) 45 (!) 44   Resp: 18 18 16   $ Temp: 97.8 F (36.6 C) 97.8 F (36.6 C) (!) 97.4 F (36.3 C)   TempSrc:  Oral Oral   SpO2:  98% 99% 98%  Weight:      Height:        Intake/Output Summary (Last 24 hours) at 04/16/2022 0757 Last data filed at 04/16/2022 0545 Gross per 24 hour  Intake 1820.5 ml  Output 1676 ml  Net 144.5 ml   Filed Weights   04/12/22 0333  Weight: 70.3 kg     Data Reviewed:   CBC: Recent Labs  Lab 04/12/22 0455 04/12/22 1400 04/13/22 0409 04/14/22 0421 04/15/22 0358 04/16/22 0434  WBC 5.6  --  6.7 5.3 3.5* 3.5*  NEUTROABS 4.7  --   --   --   --   --   HGB 11.4* 12.3* 11.1* 10.5* 9.9* 9.9*  HCT 35.3* 38.8* 35.0* 33.5* 31.4* 31.5*  MCV 92.9  --  94.3 94.9 94.3 95.7  PLT 112*  --  99* 94* 103* [id number]*   Basic Metabolic Panel: Recent Labs  Lab 04/12/22 0455 04/13/22 0409 04/14/22 0421 04/15/22 0358 04/16/22 0434  NA 130* 132* 133* 135 138  K 4.0 4.5 3.7 3.7 4.2  CL 101 104 105 108 107  CO2 22 22 20* 21* 24  GLUCOSE 112* 101* 102* 106* 101*  BUN 27* 30* 28* 34* 26*  CREATININE 1.45* 1.68* 1.67* 1.46* 1.68*  CALCIUM 8.8* 8.7* 8.6* 8.6* 8.5*  MG  --   --   --  2.0 2.0   GFR: Estimated Creatinine Clearance: 23.1 mL/min (A) (by C-G formula based on SCr of 1.68 mg/dL (H)). Liver Function Tests: Recent Labs  Lab 04/13/22 0409  AST 18  ALT 12  ALKPHOS 41  BILITOT 0.9  PROT 6.8  ALBUMIN 3.3*   No results for input(s): "LIPASE", "AMYLASE" in the last 168 hours. No results for input(s): "AMMONIA" in the last 168 hours. Coagulation Profile: Recent Labs  Lab 04/12/22 0505 04/13/22 0409 04/14/22 0421 04/15/22 1158 04/16/22 0434  INR 4.2* 2.6* 1.5* 1.7* 1.8*   Cardiac Enzymes: No results for input(s): "CKTOTAL", "CKMB", "CKMBINDEX", "TROPONINI" in the last 168 hours. BNP (last 3 results) No results for input(s): "PROBNP" in the last 8760  hours. HbA1C: No results for input(s): "HGBA1C" in the last 72 hours. CBG: No results for input(s): "GLUCAP" in the last 168 hours. Lipid Profile: No results for input(s): "CHOL", "HDL", "LDLCALC", "TRIG", "CHOLHDL", "LDLDIRECT" in the last 72 hours. Thyroid Function Tests: Recent Labs    04/14/22 0420 04/14/22 0421  TSH  --  6.894*  FREET4 1.33*  --    Anemia Panel: Recent Labs    04/14/22 0421  VITAMINB12 1,198*   Sepsis Labs: No results for input(s): "PROCALCITON", "LATICACIDVEN" in the last 168 hours.  Recent Results (from the past 240 hour(s))  Urine Culture (for pregnant, neutropenic or urologic patients or patients with an indwelling urinary catheter)     Status: None   Collection Time: 04/12/22  8:08 AM   Specimen: Urine, Catheterized  Result Value Ref Range Status   Specimen Description   Final    URINE, CATHETERIZED Performed at  [facility], [place] [address] [place], [place] [address]    Special Requests   Final    NONE Performed at [facility], [place] [address] [place], [place] [address]    Culture   Final    NO GROWTH Performed at [facility], [place] [address], [place], [place] [address]    Report Status 04/13/2022 FINAL  Final         Radiology Studies: No results found.      Scheduled Meds:  acetaminophen  500 mg Oral TID   amiodarone  100 mg Oral Daily   cephALEXin  500 mg Oral Q8H   Chlorhexidine Gluconate Cloth  6 each Topical Daily   mirabegron ER  25 mg Oral Daily   mometasone-formoterol  2 puff Inhalation BID   rosuvastatin  20 mg Oral QPM   tamsulosin  0.4 mg Oral QPC supper   warfarin  4 mg Oral ONCE-1600   Warfarin - Pharmacist Dosing Inpatient   Does not apply q1600   Continuous Infusions:  heparin 1,150 Units/hr (04/16/22 0641)     LOS: 4 days   Time spent= 35 mins    Irish Breisch Arsenio Loader, MD [facility]  If 7PM-7AM, please contact night-coverage  04/16/2022, 7:57  AM

## 2022-04-16 NOTE — Progress Notes (Addendum)
ANTICOAGULATION CONSULT NOTE  Pharmacy Consult for warfarin  Indication: atrial fibrillation, mechanical aortic valve   Allergies  Allergen Reactions   Penicillins Rash    Patient Measurements: Height: 5' (152.4 cm) Weight: 70.3 kg (155 lb) IBW/kg (Calculated) : 50 DW: 64 kg   Vital Signs: Temp: 97.4 F (36.3 C) (02/16 0545) Temp Source: Oral (02/16 0545) BP: 149/59 (02/16 0545) Pulse Rate: 44 (02/16 0545)  Labs: Recent Labs    04/14/22 0421 04/14/22 1732 04/15/22 0224 04/15/22 0358 04/15/22 1158 04/16/22 0434  HGB 10.5*  --   --  9.9*  --  9.9*  HCT 33.5*  --   --  31.4*  --  31.5*  PLT 94*  --   --  103*  --  101*  LABPROT 18.3*  --   --   --  20.0* 20.6*  INR 1.5*  --   --   --  1.7* 1.8*  HEPARINUNFRC  --    < > 0.30  --  0.34 0.39  CREATININE 1.67*  --   --  1.46*  --  1.68*   < > = values in this interval not displayed.     Estimated Creatinine Clearance: 23.1 mL/min (A) (by C-G formula based on SCr of 1.68 mg/dL (H)).   Medical History: Past Medical History:  Diagnosis Date   Asthma    CAD (coronary artery disease)    s/p NSTEMI 9/19. Cath in [place], [place] 10/19. Severe CAD not amenable to PCI. Not CABG candidate   CHF (congestive heart failure) (HCC)    EF 40-45% by echo due to iCM. EF 36% by Myoview 9/19   Dementia (HCC)    Essential tremor    Hyperlipidemia    Intolerant of statins   Hypertension    PAF (paroxysmal atrial fibrillation) ([place])    Pancytopenia ([place])    Follows with hematology   Prostate cancer ([place])    Seizure ([place])     Assessment: Tanner Ferguson is a 89 y.o. male with history of dementia, CHF, history of pancytopenia, prostate cancer, seizure disorder, CAD, history NSTEMI, paroxysmal atrial fibrillation, mechanical aortic valve on warfarin who presented to the emergency department with complaints of Foley catheter malfunction and hematuria.  Home warfarin regimen: 2.5 mg Sun, Tue, Thur and 1.25 mg Mon, Wed, Fri,  Sat Patient/family check INR at home and report value to anti-coag clinic. From previous notes, it appears patient is stable on current dose of warfarin.  INR supratherapeutic on admission - concerning in setting of hematuria prior to admission. Patient and family report no dietary or medication changes, no obvious reason for elevated INR. Warfarin held and patient given Vit K 3 mg subcu 2/12 with subsequent decline in INR.   Today, 04/16/22  - INR: 1.8 -remains subtherapeutic, but trending slightly upward - RN notes minimal bleeding and IV site. - Discussed with Dr. Reesa Chew and given minimal bleeding will change to lovenox bridge. - Hgb / Plts have drifted down, but are relatively stable - Scr = 1.68 (CrCl=23)   Goal of Therapy:  INR 2.5-3.5 Heparin level 0.3-0.7 Monitor platelets by anticoagulation protocol: Yes   Plan:   Warfarin 18m x1 today in setting of subtherapeutic INR - Start Lovenox 174mkg (7069msq every 24 hours - Continue to monitor for signs/symptoms of bleeding - Monitor renal function. -Daily CBC  AmyMendel RyderharmD Clinical Pharmacist 04/16/2022 7:15 AM

## 2022-04-16 NOTE — Progress Notes (Signed)
Re: D/c Foley catheter order placed for - 02/17  The patient's son conveyed at the bedside that the urologist informed him that his father's foley catheter will be removed after discharge during an outpatient follow-up session. After contacting the urologist, the decision was made to keep Foley in for the weekend. Oncoming nurse aware of this decision.

## 2022-04-16 NOTE — Plan of Care (Signed)
  Problem: Clinical Measurements: Goal: Ability to maintain clinical measurements within normal limits will improve Outcome: Progressing Goal: Respiratory complications will improve Outcome: Progressing   Problem: Safety: Goal: Ability to remain free from injury will improve Outcome: Progressing

## 2022-04-17 DIAGNOSIS — R310 Gross hematuria: Secondary | ICD-10-CM | POA: Diagnosis not present

## 2022-04-17 LAB — BASIC METABOLIC PANEL
Anion gap: 6 (ref 5–15)
BUN: 25 mg/dL — ABNORMAL HIGH (ref 8–23)
CO2: 23 mmol/L (ref 22–32)
Calcium: 8.7 mg/dL — ABNORMAL LOW (ref 8.9–10.3)
Chloride: 105 mmol/L (ref 98–111)
Creatinine, Ser: 1.34 mg/dL — ABNORMAL HIGH (ref 0.61–1.24)
GFR, Estimated: 50 mL/min — ABNORMAL LOW (ref >60)
Glucose, Bld: 99 mg/dL (ref 70–99)
Potassium: 4 mmol/L (ref 3.5–5.1)
Sodium: 134 mmol/L — ABNORMAL LOW (ref 135–145)

## 2022-04-17 LAB — CBC
HCT: 32.4 % — ABNORMAL LOW (ref 39.0–52.0)
Hemoglobin: 10.1 g/dL — ABNORMAL LOW (ref 13.0–17.0)
MCH: 29.4 pg (ref 26.0–34.0)
MCHC: 31.2 g/dL (ref 30.0–36.0)
MCV: 94.5 fL (ref 80.0–100.0)
Platelets: 116 10*3/uL — ABNORMAL LOW (ref 150–400)
RBC: 3.43 MIL/uL — ABNORMAL LOW (ref 4.22–5.81)
RDW: 14.8 % (ref 11.5–15.5)
WBC: 2.9 10*3/uL — ABNORMAL LOW (ref 4.0–10.5)
nRBC: 0 % (ref 0.0–0.2)

## 2022-04-17 LAB — PROTIME-INR
INR: 2 — ABNORMAL HIGH (ref 0.8–1.2)
Prothrombin Time: 22.8 seconds — ABNORMAL HIGH (ref 11.4–15.2)

## 2022-04-17 LAB — MAGNESIUM: Magnesium: 2.2 mg/dL (ref 1.7–2.4)

## 2022-04-17 MED ORDER — WARFARIN SODIUM 3 MG PO TABS
3.0000 mg | ORAL_TABLET | Freq: Once | ORAL | Status: AC
  Administered 2022-04-17: 3 mg via ORAL
  Filled 2022-04-17: qty 1

## 2022-04-17 NOTE — Progress Notes (Addendum)
ANTICOAGULATION CONSULT NOTE  Pharmacy Consult for warfarin  Indication: atrial fibrillation, mechanical aortic valve   Allergies  Allergen Reactions   Penicillins Rash    Patient Measurements: Height: 5' (152.4 cm) Weight: 70.3 kg (155 lb) IBW/kg (Calculated) : 50 DW: 64 kg   Vital Signs: Temp: 98.2 F (36.8 C) (02/17 0524) Temp Source: Oral (02/17 0524) BP: 158/61 (02/17 0907) Pulse Rate: 45 (02/17 0907)  Labs: Recent Labs    04/15/22 0224 04/15/22 0358 04/15/22 0358 04/15/22 1158 04/16/22 0434 04/17/22 0345  HGB  --  9.9*   < >  --  9.9* 10.1*  HCT  --  31.4*  --   --  31.5* 32.4*  PLT  --  103*  --   --  101* 116*  LABPROT  --   --   --  20.0* 20.6* 22.8*  INR  --   --   --  1.7* 1.8* 2.0*  HEPARINUNFRC 0.30  --   --  0.34 0.39  --   CREATININE  --  1.46*  --   --  1.68* 1.34*   < > = values in this interval not displayed.     Estimated Creatinine Clearance: 28.9 mL/min (A) (by C-G formula based on SCr of 1.34 mg/dL (H)).   Assessment: Tanner Ferguson is a 89 y.o. male with history of dementia, CHF, history of pancytopenia, prostate cancer, seizure disorder, CAD, history NSTEMI, paroxysmal atrial fibrillation, mechanical aortic valve on warfarin who presented to the emergency department with complaints of Foley catheter malfunction and hematuria.  Home warfarin regimen: 2.5 mg Sun, Tue, Thur and 1.25 mg Mon, Wed, Fri, Sat Patient/family check INR at home and report value to anti-coag clinic. From previous notes, it appears patient is stable on current dose of warfarin.  INR supratherapeutic on admission - concerning in setting of hematuria prior to admission. Patient and family report no dietary or medication changes, no obvious reason for elevated INR. Warfarin held and patient given Vit K 3 mg subcu 2/12 with subsequent decline in INR.   Today, 04/17/22  INR remains subtherapeutic, but trending upward appropriately Hgb low but stable; Plt low but  improved SCr vacillating between 1.35 - 1.65, but CrCl consistently < 30 ml/min RN notes small amount of blood around urethral catheter; has continued since catheter in place; stable First day of therapeutic anticoagulation is 2/15 = Day 1 of bridging Drug interactions with warfarin: noted to be on chronic amiodarone at home; dosing stable Heart diet ordered; eating 100% of meals   Goal of Therapy:  INR 2.5-3.5 Heparin level 0.3-0.7 Monitor platelets by anticoagulation protocol: Yes   Plan:  Warfarin 3 mg x 1 today; will err on the conservative side of warfarin dosing with some blood noted from meatus and therapeutic bridge in place Continue Lovenox 23m/kg (788m sq every 24 hours At this point, recommend resuming home warfarin regimen at discharge, as INR rising appropriately and nearly therapeutic Recheck INR 3 days post-discharge Lovenox should continue through 2/19 AND until INR therapeutic x 24 hr Continue to monitor for signs/symptoms of bleeding Daily CBC & INR  DrReuel BoomPharmD, BCPS [phone]/17/2024, 10:48 AM

## 2022-04-17 NOTE — Progress Notes (Signed)
PROGRESS NOTE    Tanner Ferguson  [id number] DOB: 1927/10/20 DOA: 04/12/2022 PCP: Wenda Low, MD   Brief Narrative:  89 year old with past medical history significant for asthma, dementia, essential tremor, hyperlipidemia, hypertension, history of pancytopenia, prostate cancer 10 years ago status post brachytherapy, seizure disorder, CAD, history of non-STEMI, history of systolic heart failure ejection fraction 36% on Myoview stress test September 2019, recovered ejection fraction of 60% on 2020 by echo, paroxysmal A-fib, mechanical aortic valve on warfarin who presents to the ED complaining of Foley catheter malfunction and hematuria. UA with large hemoglobinuria, moderate leukocyte esterase, proteinuria, more than 50 red blood cells more than 50 white blood cells, rare bacteria.  INR 4.2, platelets 112.  CT renal protocol with no acute abdominal pelvic pathology.  Diverticulosis no diverticulitis.  Haziness of the mesenteric fat as can be seen with mesenteric panniculitis or can be idiopathic.  Bilateral calcified pleural plaques.  Patient was seen by urology, Foley catheter was placed.  As INR improved, his hematuria resolved, hemoglobin remained stable.  Plan is to complete 7-day course of Keflex and follow-up outpatient with urology.  Currently waiting for INR to become therapeutic, goal 2.5-3.5 due to his mechanical valve.   Assessment & Plan:  Principal Problem:   Hematuria Active Problems:   Atrial fibrillation (HCC)   Coronary artery disease   History of mechanical aortic valve replacement   Chronic systolic heart failure (HCC)   Hyperlipidemia   Essential tremor   Hypertension   Dementia (HCC)   Hyponatremia   Thrombocytopenia (HCC)   OSA on CPAP   Stage 3b chronic kidney disease (CKD) (HCC)   Normocytic anemia   Supratherapeutic INR    Hematuria in the setting of supratherapeutic INR,  Unclear etiology of initial hematuria possibilities prior radiation, infection,  malignancy.  Seen by urology Dr Felipa Eth, maintain Foley catheter until followed up by outpatient urology.  Myrbetriq 25 mg daily, Pyridium 200 mg 3 times daily as needed for bladder discomfort.  May need outpatient cystoscopy.  Monitor INR.  Hematuria has resolved. On Keflex, complete 7-day course   Supra-therapeutic INR;  Now subtherapeutic INR- still 2.0.  Bridging Lovenox to Coumadin   General Weakness;  Suspect from acute illness.  TSH and fT4 slightly elevated. Repeat Outpatient in 4-6 weeks. B12 is normal.    Normocytic anemia: Hb stable. 11   A-fib: History of mechanical aortic valve replacement (1995) Coumadin resumed.  Goal INR 2.5-3.5. INR is 2.0.  Bridging with Lovenox, discussed with Dr. Davina Poke from cardiology.   Coronary artery disease: Currently chest pain-free.  On aspirin and statin   CKD stage IIIb Creatinine around baseline of 1.4   Chronic systolic heart failure Echocardiogram showed preserved EF of 55%.   Hyperlipidemia: On Crestor.    History of hypertension; PRN Hydralazine.    Dementia: Acute Delirium; Delirium precaution.  Supportive care.    Hyponatremia: Monitor.    Thrombocytopenia, chronic He has been previous evaluated by hematology, thought related to Bone marrow suppression.  Stable.    OSA on CPAP         Estimated body mass index is 30.27 kg/m as calculated from the following:   Height as of this encounter: 5' (1.524 m).   Weight as of this encounter: 70.3 kg.   PT/OT = Home health   DVT prophylaxis: SCD Code Status: DNR/DNI Family Communication: Son at bedside Disposition Plan:  Status is: Inpatient Remains inpatient appropriate because: Awaiting INR to become therapeutic.  Goal INR 2.5-2.5 in  the setting of mechanical valve   Subjective: Doing okay no complaints Examination: Constitutional: Not in acute distress Respiratory: Clear to auscultation bilaterally Cardiovascular: Normal sinus rhythm, no rubs Abdomen:  Nontender nondistended good bowel sounds Musculoskeletal: No edema noted Skin: No rashes seen Neurologic: CN 2-12 grossly intact.  And nonfocal Psychiatric: Normal judgment and insight. Alert and oriented x 3. Normal mood. Foley catheter in place without any hematuria  Objective: Vitals:   04/16/22 2141 04/16/22 2243 04/17/22 0135 04/17/22 0524  BP: (!) 179/69  (!) 150/55 (!) 158/62  Pulse: (!) 50  (!) 46 (!) 44  Resp: 17 17  16  $ Temp: 98 F (36.7 C)   98.2 F (36.8 C)  TempSrc: Oral   Oral  SpO2: 100%   99%  Weight:      Height:        Intake/Output Summary (Last 24 hours) at 04/17/2022 0757 Last data filed at 04/17/2022 0525 Gross per 24 hour  Intake 480 ml  Output 1700 ml  Net -1220 ml   Filed Weights   04/12/22 0333  Weight: 70.3 kg     Data Reviewed:   CBC: Recent Labs  Lab 04/12/22 0455 04/12/22 1400 04/13/22 0409 04/14/22 0421 04/15/22 0358 04/16/22 0434 04/17/22 0345  WBC 5.6  --  6.7 5.3 3.5* 3.5* 2.9*  NEUTROABS 4.7  --   --   --   --   --   --   HGB 11.4*   < > 11.1* 10.5* 9.9* 9.9* 10.1*  HCT 35.3*   < > 35.0* 33.5* 31.4* 31.5* 32.4*  MCV 92.9  --  94.3 94.9 94.3 95.7 94.5  PLT 112*  --  99* 94* 103* 101* 116*   < > = values in this interval not displayed.   Basic Metabolic Panel: Recent Labs  Lab 04/13/22 0409 04/14/22 0421 04/15/22 0358 04/16/22 0434 04/17/22 0345  NA 132* 133* 135 138 134*  K 4.5 3.7 3.7 4.2 4.0  CL 104 105 108 107 105  CO2 22 20* 21* 24 23  GLUCOSE 101* 102* 106* 101* 99  BUN 30* 28* 34* 26* 25*  CREATININE 1.68* 1.67* 1.46* 1.68* 1.34*  CALCIUM 8.7* 8.6* 8.6* 8.5* 8.7*  MG  --   --  2.0 2.0 2.2   GFR: Estimated Creatinine Clearance: 28.9 mL/min (A) (by C-G formula based on SCr of 1.34 mg/dL (H)). Liver Function Tests: Recent Labs  Lab 04/13/22 0409  AST 18  ALT 12  ALKPHOS 41  BILITOT 0.9  PROT 6.8  ALBUMIN 3.3*   No results for input(s): "LIPASE", "AMYLASE" in the last 168 hours. No results for  input(s): "AMMONIA" in the last 168 hours. Coagulation Profile: Recent Labs  Lab 04/13/22 0409 04/14/22 0421 04/15/22 1158 04/16/22 0434 04/17/22 0345  INR 2.6* 1.5* 1.7* 1.8* 2.0*   Cardiac Enzymes: No results for input(s): "CKTOTAL", "CKMB", "CKMBINDEX", "TROPONINI" in the last 168 hours. BNP (last 3 results) No results for input(s): "PROBNP" in the last 8760 hours. HbA1C: No results for input(s): "HGBA1C" in the last 72 hours. CBG: No results for input(s): "GLUCAP" in the last 168 hours. Lipid Profile: No results for input(s): "CHOL", "HDL", "LDLCALC", "TRIG", "CHOLHDL", "LDLDIRECT" in the last 72 hours. Thyroid Function Tests: No results for input(s): "TSH", "T4TOTAL", "FREET4", "T3FREE", "THYROIDAB" in the last 72 hours.  Anemia Panel: No results for input(s): "VITAMINB12", "FOLATE", "FERRITIN", "TIBC", "IRON", "RETICCTPCT" in the last 72 hours.  Sepsis Labs: No results for input(s): "PROCALCITON", "LATICACIDVEN" in the  last 168 hours.  Recent Results (from the past 240 hour(s))  Urine Culture (for pregnant, neutropenic or urologic patients or patients with an indwelling urinary catheter)     Status: None   Collection Time: 04/12/22  8:08 AM   Specimen: Urine, Catheterized  Result Value Ref Range Status   Specimen Description   Final    URINE, CATHETERIZED Performed at [facility] [address], [place], [place] [address]    Special Requests   Final    NONE Performed at [facility], [place] [address], [place], [place] [address]    Culture   Final    NO GROWTH Performed at [facility], [place] [address], [place], [place] [address]    Report Status 04/13/2022 FINAL  Final  MRSA Next Gen by PCR, Nasal     Status: None   Collection Time: 04/16/22  2:48 PM   Specimen: Nasal Mucosa; Nasal Swab  Result Value Ref Range Status   MRSA by PCR Next Gen NOT DETECTED NOT DETECTED Final    Comment: (NOTE) The GeneXpert MRSA Assay  (FDA approved for NASAL specimens only), is one component of a comprehensive MRSA colonization surveillance program. It is not intended to diagnose MRSA infection nor to guide or monitor treatment for MRSA infections. Test performance is not FDA approved in patients less than 51 years old. Performed at [facility], [place] [address], [place], [place] [address]          Radiology Studies: No results found.      Scheduled Meds:  acetaminophen  500 mg Oral TID   amiodarone  100 mg Oral Daily   cephALEXin  500 mg Oral Q8H   Chlorhexidine Gluconate Cloth  6 each Topical Daily   enoxaparin (LOVENOX) injection  1 mg/kg Subcutaneous Q24H   mirabegron ER  25 mg Oral Daily   mometasone-formoterol  2 puff Inhalation BID   rosuvastatin  20 mg Oral QPM   tamsulosin  0.4 mg Oral QPC supper   Warfarin - Pharmacist Dosing Inpatient   Does not apply q1600   Continuous Infusions:     LOS: 5 days   Time spent= 35 mins    Emil Weigold Arsenio Loader, MD [facility]  If 7PM-7AM, please contact night-coverage  04/17/2022, 7:57 AM

## 2022-04-17 NOTE — Progress Notes (Signed)
Mobility Specialist - Progress Note   04/17/22 1219  Mobility  Activity Ambulated with assistance in hallway  Level of Assistance Standby assist, set-up cues, supervision of patient - no hands on  Assistive Device Front wheel walker  Distance Ambulated (ft) 300 ft  Range of Motion/Exercises Active  Activity Response Tolerated well  Mobility Referral Yes  $Mobility charge 1 Mobility   Pt was found in bed and agreeable to ambulate. Started breathing heavier half way through session but stated still feeling fine, SPO2 was 98%. At EOS returned to bed with all necessities in reach and bed alarm on.  Ferd Hibbs Mobility Specialist

## 2022-04-18 DIAGNOSIS — R310 Gross hematuria: Secondary | ICD-10-CM | POA: Diagnosis not present

## 2022-04-18 LAB — PROTIME-INR
INR: 2.5 — ABNORMAL HIGH (ref 0.8–1.2)
Prothrombin Time: 26.9 seconds — ABNORMAL HIGH (ref 11.4–15.2)

## 2022-04-18 LAB — CBC
HCT: 32.9 % — ABNORMAL LOW (ref 39.0–52.0)
Hemoglobin: 10.1 g/dL — ABNORMAL LOW (ref 13.0–17.0)
MCH: 29.5 pg (ref 26.0–34.0)
MCHC: 30.7 g/dL (ref 30.0–36.0)
MCV: 96.2 fL (ref 80.0–100.0)
Platelets: 112 10*3/uL — ABNORMAL LOW (ref 150–400)
RBC: 3.42 MIL/uL — ABNORMAL LOW (ref 4.22–5.81)
RDW: 15 % (ref 11.5–15.5)
WBC: 3.3 10*3/uL — ABNORMAL LOW (ref 4.0–10.5)
nRBC: 0 % (ref 0.0–0.2)

## 2022-04-18 LAB — BASIC METABOLIC PANEL
Anion gap: 3 — ABNORMAL LOW (ref 5–15)
BUN: 33 mg/dL — ABNORMAL HIGH (ref 8–23)
CO2: 23 mmol/L (ref 22–32)
Calcium: 8.7 mg/dL — ABNORMAL LOW (ref 8.9–10.3)
Chloride: 107 mmol/L (ref 98–111)
Creatinine, Ser: 1.43 mg/dL — ABNORMAL HIGH (ref 0.61–1.24)
GFR, Estimated: 46 mL/min — ABNORMAL LOW (ref >60)
Glucose, Bld: 96 mg/dL (ref 70–99)
Potassium: 4.2 mmol/L (ref 3.5–5.1)
Sodium: 133 mmol/L — ABNORMAL LOW (ref 135–145)

## 2022-04-18 LAB — MAGNESIUM: Magnesium: 2.1 mg/dL (ref 1.7–2.4)

## 2022-04-18 MED ORDER — WARFARIN SODIUM 2 MG PO TABS
2.0000 mg | ORAL_TABLET | Freq: Once | ORAL | Status: DC
  Filled 2022-04-18: qty 1

## 2022-04-18 MED ORDER — MIRABEGRON ER 25 MG PO TB24: 25.0000 mg | ORAL_TABLET | Freq: Every day | ORAL | 0 refills | Status: DC

## 2022-04-18 MED ORDER — CEPHALEXIN 500 MG PO CAPS: 500.0000 mg | ORAL_CAPSULE | Freq: Three times a day (TID) | ORAL | 0 refills | Status: AC

## 2022-04-18 MED ORDER — PHENAZOPYRIDINE HCL 200 MG PO TABS: 200.0000 mg | ORAL_TABLET | Freq: Three times a day (TID) | ORAL | 0 refills | Status: DC | PRN

## 2022-04-18 NOTE — Progress Notes (Signed)
Pt discharge teaching provided to patient and son.  Pt discharged with foley catheter.  Teaching provided.  New bag and leg bag sent home with patient.  Patient taken to front entrance in personal wheelchair.

## 2022-04-18 NOTE — TOC Transition Note (Signed)
Transition of Care [facility]) - CM/SW Discharge Note   Patient Details  Name: Tanner Ferguson MRN: [id number] Date of Birth: May 01, 1927  [facility] CM/SW Contact:  Vassie Moselle, LCSW Phone Number: 04/18/2022, 9:11 AM   Clinical Narrative:    Spoke with pt's son via t/c who shares he no longer believes home health is needed for pt and declined for [facility] to be arranged.  No further TOC needs identified at this time. TOC signing off.    Final next level of care: Home/Self Care Barriers to Discharge: No Barriers Identified   Patient Goals and CMS Choice CMS Medicare.gov Compare Post Acute Care list provided to:: Patient Represenative (must comment) Choice offered to / list presented to : Adult Children  Discharge Placement                         Discharge Plan and Services Additional resources added to the After Visit Summary for                  DME Arranged: N/A DME Agency: NA                  Social Determinants of Health (SDOH) Interventions SDOH Screenings   Food Insecurity: No Food Insecurity (04/12/2022)  Housing: Low Risk  (04/12/2022)  Transportation Needs: No Transportation Needs (04/12/2022)  Utilities: Not At Risk (04/12/2022)  Tobacco Use: Medium Risk (04/12/2022)     Readmission Risk Interventions    04/18/2022    9:10 AM  Readmission Risk Prevention Plan  Transportation Screening Complete  PCP or Specialist Appt within 3-5 Days Complete  HRI or [place] Complete  Social Work Consult for [place] Planning/Counseling Complete  Palliative Care Screening Not Applicable  Medication Review Press photographer) Complete

## 2022-04-18 NOTE — Progress Notes (Signed)
[place] for warfarin  Indication: atrial fibrillation, mechanical aortic valve   Allergies  Allergen Reactions   Penicillins Rash    Patient Measurements: Height: 5' (152.4 cm) Weight: 70.3 kg (155 lb) IBW/kg (Calculated) : 50 DW: 64 kg   Vital Signs: Temp: 98.2 F (36.8 C) (02/18 0408) Temp Source: Oral (02/18 0408) BP: 142/48 (02/18 0408) Pulse Rate: 47 (02/18 0408)  Labs: Recent Labs    04/15/22 1158 04/15/22 1158 04/16/22 0434 04/17/22 0345 04/18/22 0329  HGB  --    < > 9.9* 10.1* 10.1*  HCT  --   --  31.5* 32.4* 32.9*  PLT  --   --  101* 116* 112*  LABPROT 20.0*  --  20.6* 22.8* 26.9*  INR 1.7*  --  1.8* 2.0* 2.5*  HEPARINUNFRC 0.34  --  0.39  --   --   CREATININE  --   --  1.68* 1.34* 1.43*   < > = values in this interval not displayed.     Estimated Creatinine Clearance: 27.1 mL/min (A) (by C-G formula based on SCr of 1.43 mg/dL (H)).   Assessment: Tanner Ferguson is a 89 y.o. male with history of dementia, CHF, history of pancytopenia, prostate cancer, seizure disorder, CAD, history NSTEMI, paroxysmal atrial fibrillation, mechanical aortic valve on warfarin who presented to the emergency department with complaints of Foley catheter malfunction and hematuria.  Home warfarin regimen: 2.5 mg Sun, Tue, Thur and 1.25 mg Mon, Wed, Fri, Sat Patient/family check INR at home and report value to anti-coag clinic. From previous notes, it appears patient is stable on current dose of warfarin.  INR supratherapeutic on admission - concerning in setting of hematuria prior to admission. Patient and family report no dietary or medication changes, no obvious reason for elevated INR. Warfarin held and patient given Vit K 3 mg subcu 2/12 with subsequent decline in INR.   Today, 04/18/22  INR therapeutic today with appropriate upward trend Hgb/Plt both low but stable SCr vacillating between 1.35 - 1.65, but CrCl consistently < 30  ml/min RN notes small amount of blood around urethral catheter; has continued since catheter in place; stable First day of therapeutic anticoagulation is 2/15 = Day 1 of bridging Patient appears to have refused last 2 doses of Lovenox bridge Drug interactions with warfarin: noted to be on chronic amiodarone at home; dosing stable Heart diet ordered; eating 100% of meals   Goal of Therapy:  INR 2.5-3.5 Heparin level 0.3-0.7 Monitor platelets by anticoagulation protocol: Yes   Plan:  Warfarin 2 mg x 1 today (if not already discharged) Continue Lovenox 1 mg/kg (70 mg) SQ every 24 hours At this point, recommend resuming home warfarin regimen at discharge, as INR rising appropriately and nearly therapeutic Recheck INR 3 days post-discharge Lovenox should continue through 2/19 AND until INR therapeutic x 24 hr Continue to monitor for signs/symptoms of bleeding Daily CBC & INR  Reuel Boom, PharmD, BCPS [phone] 04/18/2022, 9:35 AM

## 2022-04-18 NOTE — Discharge Summary (Signed)
Physician Discharge Summary  Tanner Ferguson [id number] DOB: [birth year] DOA: 04/12/2022  PCP: Wenda Low, MD  Admit date: 04/12/2022 Discharge date: 04/18/2022  Admitted From: Home Disposition: Home  Recommendations for Outpatient Follow-up:  Follow up with PCP in 1-2 weeks Please obtain BMP/CBC in one week your next doctors visit.  Discharged with Foley catheter.  1 more day of oral Keflex prescribed Outpatient follow-up with Dr. Felipa Eth from urology. Repeat INR check with his regular Coumadin clinic in the next 3-4 days   Discharge Condition: Stable CODE STATUS: DNR Diet recommendation: Heart healthy  Brief/Interim Summary:  89 year old with past medical history significant for asthma, dementia, essential tremor, hyperlipidemia, hypertension, history of pancytopenia, prostate cancer 10 years ago status post brachytherapy, seizure disorder, CAD, history of non-STEMI, history of systolic heart failure ejection fraction 36% on Myoview stress test September 2019, recovered ejection fraction of 60% on 2020 by echo, paroxysmal A-fib, mechanical aortic valve on warfarin who presents to the ED complaining of Foley catheter malfunction and hematuria. UA with large hemoglobinuria, moderate leukocyte esterase, proteinuria, more than 50 red blood cells more than 50 white blood cells, rare bacteria.  INR 4.2, platelets 112.  CT renal protocol with no acute abdominal pelvic pathology.  Diverticulosis no diverticulitis.  Haziness of the mesenteric fat as can be seen with mesenteric panniculitis or can be idiopathic.  Bilateral calcified pleural plaques.  Patient was seen by urology, Foley catheter was placed.  As INR improved, his hematuria resolved, hemoglobin remained stable.  Plan is to complete 7-day course of Keflex and follow-up outpatient with urology.  Eventually INR became therapeutic on 2/18 and patient was deemed stable to be discharged with recommendations as stated above.     Assessment  & Plan:  Principal Problem:   Hematuria Active Problems:   Atrial fibrillation (HCC)   Coronary artery disease   History of mechanical aortic valve replacement   Chronic systolic heart failure (HCC)   Hyperlipidemia   Essential tremor   Hypertension   Dementia (HCC)   Hyponatremia   Thrombocytopenia (HCC)   OSA on CPAP   Stage 3b chronic kidney disease (CKD) (HCC)   Normocytic anemia   Supratherapeutic INR    Hematuria in the setting of supratherapeutic INR,  Unclear etiology of initial hematuria possibilities prior radiation, infection, malignancy.  Seen by urology Dr Felipa Eth, maintain Foley catheter until followed up by outpatient urology.  Myrbetriq 25 mg daily, Pyridium 200 mg 3 times daily as needed for bladder discomfort.  May need outpatient cystoscopy.  Monitor INR.  Hematuria has resolved. On Keflex, complete 7-day course, 1 more day left on the day of discharge.   Supra-therapeutic INR;  Heparin drip and Lovenox use to bridge Coumadin to therapeutic INR.  Today it is 2.5, stable for discharge.   General Weakness;  Suspect from acute illness.  TSH and fT4 slightly elevated. Repeat Outpatient in 4-6 weeks. B12 is normal.    Normocytic anemia: Hb stable. 11   A-fib: History of mechanical aortic valve replacement (1995) Coumadin resumed.  INR is therapeutic.  Repeat outpatient with regular Coumadin clinic at next 2-4 days   Coronary artery disease: Currently chest pain-free.  On aspirin and statin   CKD stage IIIb Creatinine around baseline of 1.4   Chronic systolic heart failure Echocardiogram showed preserved EF of 55%.   Hyperlipidemia: On Crestor.    History of hypertension; PRN Hydralazine.    Dementia: Acute Delirium; Delirium precaution.  Supportive care.    Hyponatremia: Monitor.  Thrombocytopenia, chronic He has been previous evaluated by hematology, thought related to Bone marrow suppression.  Stable.    OSA on CPAP            Consultations: Urology  Subjective: No overnight events  Discharge Exam: Vitals:   04/17/22 2047 04/18/22 0408  BP:  (!) 142/48  Pulse:  (!) 47  Resp:  17  Temp:  98.2 F (36.8 C)  SpO2: 99% 98%   Vitals:   04/17/22 1514 04/17/22 2004 04/17/22 2047 04/18/22 0408  BP: (!) 128/55 (!) 134/46  (!) 142/48  Pulse: (!) 49 (!) 48  (!) 47  Resp: 16   17  Temp: 98.2 F (36.8 C) 98 F (36.7 C)  98.2 F (36.8 C)  TempSrc: Oral Oral  Oral  SpO2: 98% 99% 99% 98%  Weight:      Height:         Discharge Instructions  Discharge Instructions     Ambulatory referral to Urology   Complete by: As directed    Posthospitalization follow-up.  Seen by Dr. Felipa Eth in the hospital and recommended follow-up with him   Face-to-face encounter (required for Medicare/Medicaid patients)   Complete by: As directed    [name] certify that this patient is under my care and that I, or a nurse practitioner or physician's assistant working with me, had a face-to-face encounter that meets the physician face-to-face encounter requirements with this patient on 04/18/2022. The encounter with the patient was in whole, or in part for the following medical condition(s) which is the primary reason for home health care he would benefit from home health therapy to continue to gain his strength due to his weakness secondary to urinary tract infection.  Improving balance and strength will help him as patient is on Coumadin.   The encounter with the patient was in whole, or in part, for the following medical condition, which is the primary reason for home health care: weakness from uti   I certify that, based on my findings, the following services are medically necessary home health services: Physical therapy   Reason for Medically Necessary Home Health Services:  Therapy- Skilled Evaluation of Speech Comprehension and Safe Swallowing Therapy- Therapeutic Exercises to Increase Strength and Endurance      My clinical findings support the need for the above services: Unsafe ambulation due to balance issues   Further, I certify that my clinical findings support that this patient is homebound due to: Ambulates short distances less than 300 feet   Home Health   Complete by: As directed    To provide the following care/treatments: PT      Allergies as of 04/18/2022       Reactions   Penicillins Rash        Medication List     TAKE these medications    albuterol 108 (90 Base) MCG/ACT inhaler Commonly known as: VENTOLIN HFA Inhale 1-2 puffs into the lungs every 4 (four) hours as needed for wheezing or shortness of breath.   amiodarone 200 MG tablet Commonly known as: PACERONE Take 1/2 (one-half) tablet by mouth once daily What changed: See the new instructions.   aspirin EC 81 MG tablet Take 1 tablet (81 mg total) by mouth daily with breakfast.   cephALEXin 500 MG capsule Commonly known as: KEFLEX Take 1 capsule (500 mg total) by mouth every 8 (eight) hours for 1 day.   cholecalciferol 25 MCG (1000 UNIT) tablet Commonly known as: VITAMIN D3  Take 1,000 Units by mouth daily.   fluticasone 50 MCG/ACT nasal spray Commonly known as: FLONASE Place 1 spray into both nostrils daily.   guaiFENesin-dextromethorphan 100-10 MG/5ML syrup Commonly known as: ROBITUSSIN DM Take 5 mLs by mouth every 4 (four) hours as needed for cough.   ipratropium-albuterol 0.5-2.5 (3) MG/3ML Soln Commonly known as: DUONEB Take 3 mLs by nebulization every 6 (six) hours as needed.   mirabegron ER 25 MG Tb24 tablet Commonly known as: MYRBETRIQ Take 1 tablet (25 mg total) by mouth daily.   phenazopyridine 200 MG tablet Commonly known as: Pyridium Take 1 tablet (200 mg total) by mouth 3 (three) times daily as needed for up to 30 doses for pain.   rosuvastatin 20 MG tablet Commonly known as: CRESTOR Take 1 tablet (20 mg total) by mouth every evening.   sodium chloride 0.65 % Soln nasal  spray Commonly known as: OCEAN Place 1 spray into both nostrils as needed for congestion.   Symbicort 80-4.5 MCG/ACT inhaler Generic drug: budesonide-formoterol Inhale 2 puffs by mouth twice daily   tamsulosin 0.4 MG Caps capsule Commonly known as: FLOMAX Take 0.4 mg by mouth daily after supper.   warfarin 2.5 MG tablet Commonly known as: COUMADIN Take as directed. If you are unsure how to take this medication, talk to your nurse or doctor. Original instructions: Take 0.5-1 tablet daily or as prescribed by Coumadin Clinic What changed:  how much to take how to take this when to take this additional instructions        Follow-up Information     Wenda Low, MD Follow up.   Specialty: Internal Medicine Contact information: [address] [place] [address] [phone]         Primus Bravo., MD Follow up on 04/15/2022.   Specialty: Urology Why: AM appointment for voiding trial Contact information: [place] [address] [phone]         Wenda Low, MD Follow up in 1 week(s).   Specialty: Internal Medicine Contact information: [address] [place] [address] [phone]                Allergies  Allergen Reactions   Penicillins Rash    You were cared for by a hospitalist during your hospital stay. If you have any questions about your discharge medications or the care you received while you were in the hospital after you are discharged, you can call the unit and asked to speak with the hospitalist on call if the hospitalist that took care of you is not available. Once you are discharged, your primary care physician will handle any further medical issues. Please note that no refills for any discharge medications will be authorized once you are discharged, as it is imperative that you return to your primary care physician (or establish a relationship with a primary care  physician if you do not have one) for your aftercare needs so that they can reassess your need for medications and monitor your lab values.   Procedures/Studies: ECHOCARDIOGRAM COMPLETE  Result Date: 04/13/2022    ECHOCARDIOGRAM REPORT   Patient Name:   Tanner Ferguson Date of Exam: 04/13/2022 Medical Rec #:  [id number]    Height:       60.0 in Accession #:    [id number]   Weight:       155.0 lb Date of Birth:  11-24-1927    BSA:  1.675 m Patient Age:    89 years     BP:           111/50 mmHg Patient Gender: M            HR:           49 bpm. Exam Location:  Inpatient Procedure: 2D Echo, Cardiac Doppler, Color Doppler and Intracardiac            Opacification Agent Indications:    CHF/Atrial fibrillation  History:        Patient has prior history of Echocardiogram examinations, most                 recent 11/08/2018. CHF, CAD and Previous Myocardial Infarction,                 Arrythmias:Atrial Fibrillation; Risk Factors:Hypertension and                 Dyslipidemia. Cancer.                 Aortic Valve: 25 mm St. Jude mechanical valve is present in the                 aortic position. Procedure Date: 1995.  Sonographer:    Eartha Inch Referring Phys: [id number] [place]  Sonographer Comments: Technically difficult study due to poor echo windows. Image acquisition challenging due to patient body habitus and Image acquisition challenging due to respiratory motion. IMPRESSIONS  1. Left ventricular ejection fraction, by estimation, is 55%. The left ventricle has low normal function. The left ventricle demonstrates regional wall motion abnormalities (see scoring diagram/findings for description). There is hypokinesis of the apical inferoseptal LV segment and apex that is best appreciated on definity imaging.There is severe hypertrophy of the basal septal segment. The rest of the LV segments demonstrate mild concentric left ventricular hypertrophy. Left ventricular diastolic  parameters are consistent  with Grade I diastolic dysfunction (impaired relaxation).  2. Right ventricular systolic function is low normal. The right ventricular size is mildly enlarged.  3. Left atrial size was severely dilated.  4. Right atrial size was mildly dilated.  5. The mitral valve is grossly normal. Mild mitral valve regurgitation. No evidence of mitral stenosis.  6. The aortic valve has been repaired/replaced. There is a 25 mm St. Jude mechanical valve present in the aortic position. Procedure Date: 1995. Echo findings are consistent with normal structure and function of the aortic valve prosthesis. Aortic valve  mean gradient measures 5.5 mmHg. Aortic valve Vmax measures 1.56 m/s. DI 0.5. EOA 1.5cm2. Mild perivalvular leak.  7. Tricuspid valve regurgitation is moderate.  8. Aortic dilatation noted. There is borderline dilatation of the ascending aorta, measuring 38 mm. Comparison(s): Prior images reviewed side by side. Compared to prior TTE in 2020, EF appears slightly less robust with WMA as detailed above (prior TTE with EF 60%) with stable aortic valve gradients. FINDINGS  Left Ventricle: There is hypokinesis of the apical inferoseptal LV segment and apex (best appreciated on definity imaging). Left ventricular ejection fraction, by estimation, is 55%. The left ventricle has low normal function. The left ventricle demonstrates regional wall motion abnormalities. Definity contrast agent was given IV to delineate the left ventricular endocardial borders. The left ventricular internal cavity size was normal in size. There is severe hypertrophy of the basal septal segment. The rest of the LV segments demonstrate mild concentric left ventricular hypertrophy. Abnormal (paradoxical) septal motion consistent with post-operative status. Left ventricular  diastolic parameters are consistent with Grade I diastolic dysfunction (impaired relaxation). Right Ventricle: The right ventricular size is mildly enlarged. No increase in right  ventricular wall thickness. Right ventricular systolic function is low normal. Left Atrium: Left atrial size was severely dilated. Right Atrium: Right atrial size was mildly dilated. Pericardium: There is no evidence of pericardial effusion. Mitral Valve: The mitral valve is grossly normal. Mild mitral valve regurgitation. No evidence of mitral valve stenosis. MV peak gradient, 3.0 mmHg. The mean mitral valve gradient is 1.0 mmHg. Tricuspid Valve: The tricuspid valve is normal in structure. Tricuspid valve regurgitation is moderate. Aortic Valve: DI 0.5, EOA 1.6cm3. The aortic valve has been repaired/replaced. Aortic valve mean gradient measures 5.5 mmHg. Aortic valve peak gradient measures 9.7 mmHg. Aortic valve area, by VTI measures 1.61 cm. There is a 25 mm St. Jude mechanical valve present in the aortic position. Procedure Date: 1995. Echo findings are consistent with normal structure and function of the aortic valve prosthesis. Pulmonic Valve: The pulmonic valve was normal in structure. Pulmonic valve regurgitation is not visualized. Aorta: Aortic dilatation noted. There is borderline dilatation of the ascending aorta, measuring 38 mm. IAS/Shunts: The atrial septum is grossly normal.  LEFT VENTRICLE PLAX 2D LVIDd:         4.60 cm     Diastology LVIDs:         3.40 cm     LV e' medial:    2.61 cm/s LV PW:         1.10 cm     LV E/e' medial:  24.1 LV IVS:        1.20 cm     LV e' lateral:   4.88 cm/s LVOT diam:     2.10 cm     LV E/e' lateral: 12.9 LV SV:         55 LV SV Index:   33 LVOT Area:     3.46 cm  LV Volumes (MOD) LV vol d, MOD A2C: 79.9 ml LV vol d, MOD A4C: 71.0 ml LV vol s, MOD A2C: 37.5 ml LV vol s, MOD A4C: 32.8 ml LV SV MOD A2C:     42.4 ml LV SV MOD A4C:     71.0 ml LV SV MOD BP:      39.4 ml RIGHT VENTRICLE RV S prime:     6.66 cm/s TAPSE (M-mode): 1.9 cm LEFT ATRIUM              Index        RIGHT ATRIUM           Index LA diam:        4.80 cm  2.87 cm/m   RA Area:     21.60 cm LA Vol  (A2C):   102.0 ml 60.90 ml/m  RA Volume:   54.90 ml  32.78 ml/m LA Vol (A4C):   89.7 ml  53.55 ml/m LA Biplane Vol: 98.6 ml  58.87 ml/m  AORTIC VALVE AV Area (Vmax):    1.45 cm AV Area (Vmean):   1.39 cm AV Area (VTI):     1.61 cm AV Vmax:           155.50 cm/s AV Vmean:          110.500 cm/s AV VTI:            0.339 m AV Peak Grad:      9.7 mmHg AV Mean Grad:      5.5 mmHg LVOT  Vmax:         65.30 cm/s LVOT Vmean:        44.400 cm/s LVOT VTI:          0.158 m LVOT/AV VTI ratio: 0.47  AORTA Ao Root diam: 3.60 cm Ao Asc diam:  3.80 cm MITRAL VALVE               TRICUSPID VALVE MV Area (PHT): 1.92 cm    TR Peak grad:   26.0 mmHg MV Peak grad:  3.0 mmHg    TR Mean grad:   19.0 mmHg MV Mean grad:  1.0 mmHg    TR Vmax:        255.00 cm/s MV Vmax:       0.86 m/s    TR Vmean:       209.0 cm/s MV Vmean:      50.2 cm/s MV Decel Time: 396 msec    SHUNTS MR Peak grad: 7.8 mmHg     Systemic VTI:  0.16 m MR Vmax:      139.47 cm/s  Systemic Diam: 2.10 cm MV E velocity: 63.00 cm/s MV A velocity: 83.60 cm/s MV E/A ratio:  0.75 Gwyndolyn Kaufman MD Electronically signed by Gwyndolyn Kaufman MD Signature Date/Time: 04/13/2022/10:44:40 AM    Final    CT Renal Stone Study  Result Date: 04/11/2022 CLINICAL DATA:  Abdominal pain, gross hematuria EXAM: CT ABDOMEN AND PELVIS WITHOUT CONTRAST TECHNIQUE: Multidetector CT imaging of the abdomen and pelvis was performed following the standard protocol without IV contrast. RADIATION DOSE REDUCTION: This exam was performed according to the departmental dose-optimization program which includes automated exposure control, adjustment of the mA and/or kV according to patient size and/or use of iterative reconstruction technique. COMPARISON:  None Available. FINDINGS: Lower chest: No acute abnormality. Bilateral calcified pleural plaques along the diaphragmatic surface. Hepatobiliary: No focal liver abnormality is seen. No gallstones, gallbladder wall thickening, or biliary dilatation.  Pancreas: Unremarkable. No pancreatic ductal dilatation or surrounding inflammatory changes. Spleen: Normal in size without focal abnormality. Adrenals/Urinary Tract: Adrenal glands are unremarkable. Kidneys are normal, without renal calculi, focal lesion, or hydronephrosis. Bladder is unremarkable. Stomach/Bowel: Stomach is within normal limits. No evidence of bowel wall thickening, distention, or inflammatory changes. Moderate amount of stool in the ascending and transverse colon. Diverticulosis without evidence of diverticulitis. Haziness in the mesenteric fat as can be seen with mesenteric panniculitis or can be idiopathic. Vascular/Lymphatic: Aortic atherosclerosis. No enlarged abdominal or pelvic lymph nodes. Reproductive: Prostate is unremarkable. Prostatic radiation seeds noted. Other: No abdominal wall hernia or abnormality. No abdominopelvic ascites. Musculoskeletal: No acute osseous abnormality. No aggressive osseous lesion. Degenerative disease with disc height loss at L1-2, L4-5 and L5-S1. Bilateral facet arthropathy throughout the lumbar spine. Levoscoliosis of the lumbar spine. IMPRESSION: 1. No acute abdominal or pelvic pathology. 2. Diverticulosis without evidence of diverticulitis. 3. Haziness in the mesenteric fat as can be seen with mesenteric panniculitis or can be idiopathic. 4. Bilateral calcified pleural plaques along the diaphragmatic surface consistent with prior asbestos exposure. 5.  Aortic Atherosclerosis (ICD10-I70.0). Electronically Signed   By: Kathreen Devoid M.D.   On: 04/11/2022 13:05     The results of significant diagnostics from this hospitalization (including imaging, microbiology, ancillary and laboratory) are listed below for reference.     Microbiology: Recent Results (from the past 240 hour(s))  Urine Culture (for pregnant, neutropenic or urologic patients or patients with an indwelling urinary catheter)     Status: None   Collection  Time: 04/12/22  8:08 AM    Specimen: Urine, Catheterized  Result Value Ref Range Status   Specimen Description   Final    URINE, CATHETERIZED Performed at [facility] [address], [place], [place] [address]    Special Requests   Final    NONE Performed at [facility], [place] [address], [place], [place] [address]    Culture   Final    NO GROWTH Performed at [facility], [place] [address], [place], [place] [address]    Report Status 04/13/2022 FINAL  Final  MRSA Next Gen by PCR, Nasal     Status: None   Collection Time: 04/16/22  2:48 PM   Specimen: Nasal Mucosa; Nasal Swab  Result Value Ref Range Status   MRSA by PCR Next Gen NOT DETECTED NOT DETECTED Final    Comment: (NOTE) The GeneXpert MRSA Assay (FDA approved for NASAL specimens only), is one component of a comprehensive MRSA colonization surveillance program. It is not intended to diagnose MRSA infection nor to guide or monitor treatment for MRSA infections. Test performance is not FDA approved in patients less than 36 years old. Performed at [facility], [place] [address] [place], [place] [address]      Labs: BNP (last 3 results) Recent Labs    08/19/21 2052  BNP [id number]*   Basic Metabolic Panel: Recent Labs  Lab 04/14/22 0421 04/15/22 0358 04/16/22 0434 04/17/22 0345 04/18/22 0329  NA 133* 135 138 134* 133*  K 3.7 3.7 4.2 4.0 4.2  CL 105 108 107 105 107  CO2 20* 21* 24 23 23  $ GLUCOSE 102* 106* 101* 99 96  BUN 28* 34* 26* 25* 33*  CREATININE 1.67* 1.46* 1.68* 1.34* 1.43*  CALCIUM 8.6* 8.6* 8.5* 8.7* 8.7*  MG  --  2.0 2.0 2.2 2.1   Liver Function Tests: Recent Labs  Lab 04/13/22 0409  AST 18  ALT 12  ALKPHOS 41  BILITOT 0.9  PROT 6.8  ALBUMIN 3.3*   No results for input(s): "LIPASE", "AMYLASE" in the last 168 hours. No results for input(s): "AMMONIA" in the last 168 hours. CBC: Recent Labs  Lab 04/12/22 0455 04/12/22 1400 04/14/22 0421 04/15/22 0358  04/16/22 0434 04/17/22 0345 04/18/22 0329  WBC 5.6   < > 5.3 3.5* 3.5* 2.9* 3.3*  NEUTROABS 4.7  --   --   --   --   --   --   HGB 11.4*   < > 10.5* 9.9* 9.9* 10.1* 10.1*  HCT 35.3*   < > 33.5* 31.4* 31.5* 32.4* 32.9*  MCV 92.9   < > 94.9 94.3 95.7 94.5 96.2  PLT 112*   < > 94* 103* 101* 116* 112*   < > = values in this interval not displayed.   Cardiac Enzymes: No results for input(s): "CKTOTAL", "CKMB", "CKMBINDEX", "TROPONINI" in the last 168 hours. BNP: Invalid input(s): "POCBNP" CBG: No results for input(s): "GLUCAP" in the last 168 hours. D-Dimer No results for input(s): "DDIMER" in the last 72 hours. Hgb A1c No results for input(s): "HGBA1C" in the last 72 hours. Lipid Profile No results for input(s): "CHOL", "HDL", "LDLCALC", "TRIG", "CHOLHDL", "LDLDIRECT" in the last 72 hours. Thyroid function studies No results for input(s): "TSH", "T4TOTAL", "T3FREE", "THYROIDAB" in the last 72 hours.  Invalid input(s): "FREET3" Anemia work up No results for input(s): "VITAMINB12", "FOLATE", "FERRITIN", "TIBC", "IRON", "RETICCTPCT" in the last 72 hours. Urinalysis    Component Value Date/Time  COLORURINE YELLOW 04/12/2022 0345   APPEARANCEUR CLOUDY (A) 04/12/2022 0345   LABSPEC 1.014 04/12/2022 0345   PHURINE 7.0 04/12/2022 0345   GLUCOSEU NEGATIVE 04/12/2022 0345   HGBUR LARGE (A) 04/12/2022 0345   BILIRUBINUR NEGATIVE 04/12/2022 0345   KETONESUR NEGATIVE 04/12/2022 0345   PROTEINUR 100 (A) 04/12/2022 0345   NITRITE NEGATIVE 04/12/2022 0345   LEUKOCYTESUR MODERATE (A) 04/12/2022 0345   Sepsis Labs Recent Labs  Lab 04/15/22 0358 04/16/22 0434 04/17/22 0345 04/18/22 0329  WBC 3.5* 3.5* 2.9* 3.3*   Microbiology Recent Results (from the past 240 hour(s))  Urine Culture (for pregnant, neutropenic or urologic patients or patients with an indwelling urinary catheter)     Status: None   Collection Time: 04/12/22  8:08 AM   Specimen: Urine, Catheterized  Result Value Ref  Range Status   Specimen Description   Final    URINE, CATHETERIZED Performed at [facility], [place] [address], [place], [place] [address]    Special Requests   Final    NONE Performed at [facility], [place] [address], [place], [place] [address]    Culture   Final    NO GROWTH Performed at [facility], [place] [address] [place], [place] [address]    Report Status 04/13/2022 FINAL  Final  MRSA Next Gen by PCR, Nasal     Status: None   Collection Time: 04/16/22  2:48 PM   Specimen: Nasal Mucosa; Nasal Swab  Result Value Ref Range Status   MRSA by PCR Next Gen NOT DETECTED NOT DETECTED Final    Comment: (NOTE) The GeneXpert MRSA Assay (FDA approved for NASAL specimens only), is one component of a comprehensive MRSA colonization surveillance program. It is not intended to diagnose MRSA infection nor to guide or monitor treatment for MRSA infections. Test performance is not FDA approved in patients less than 4 years old. Performed at [facility], [facility] [address], [place], [place] [address]      Time coordinating discharge:  I have spent 35 minutes face to face with the patient and on the ward discussing the patients care, assessment, plan and disposition with other care givers. >50% of the time was devoted counseling the patient about the risks and benefits of treatment/Discharge disposition and coordinating care.   SIGNED:   Damita Lack, MD  [facility] 04/18/2022, 12:01 PM   If 7PM-7AM, please contact night-coverage

## 2022-04-19 ENCOUNTER — Ambulatory Visit (INDEPENDENT_AMBULATORY_CARE_PROVIDER_SITE_OTHER): Payer: Medicare Other | Admitting: *Deleted

## 2022-04-19 DIAGNOSIS — Z7901 Long term (current) use of anticoagulants: Secondary | ICD-10-CM

## 2022-04-19 DIAGNOSIS — I480 Paroxysmal atrial fibrillation: Secondary | ICD-10-CM | POA: Diagnosis not present

## 2022-04-19 DIAGNOSIS — Z952 Presence of prosthetic heart valve: Secondary | ICD-10-CM | POA: Diagnosis not present

## 2022-04-19 LAB — POCT INR: INR: 2.9 (ref 2.0–3.0)

## 2022-04-19 NOTE — Patient Instructions (Signed)
Description   Spoke with pt's son, Dominica Severin, and instructed for pt to take 1/2 tablet tomorrow then continue taking warfarin 1/2 tablet daily except for 1 tablet on Sunday, Tuesday, and Thursday. Stay consistent with greens (1-2 timers per week).   Recheck INR on Friday-post vit k in hospital. (self tester, checks weekly).

## 2022-04-20 ENCOUNTER — Encounter: Payer: Self-pay | Admitting: Urology

## 2022-04-20 ENCOUNTER — Ambulatory Visit (INDEPENDENT_AMBULATORY_CARE_PROVIDER_SITE_OTHER): Payer: Medicare Other | Admitting: Urology

## 2022-04-20 VITALS — BP 149/59 | HR 47 | Ht 60.0 in | Wt 150.0 lb

## 2022-04-20 DIAGNOSIS — Z8546 Personal history of malignant neoplasm of prostate: Secondary | ICD-10-CM | POA: Insufficient documentation

## 2022-04-20 DIAGNOSIS — R339 Retention of urine, unspecified: Secondary | ICD-10-CM | POA: Insufficient documentation

## 2022-04-20 DIAGNOSIS — R310 Gross hematuria: Secondary | ICD-10-CM

## 2022-04-20 MED ORDER — CIPROFLOXACIN HCL 500 MG PO TABS
500.0000 mg | ORAL_TABLET | Freq: Once | ORAL | Status: AC
  Administered 2022-04-20: 500 mg via ORAL

## 2022-04-20 NOTE — Progress Notes (Signed)
Assessment: 1. Gross hematuria   2. Urinary retention   3. History of prostate cancer     Plan: Foley removed today Cipro x 1 following foley removal Return to office in 10-14 days for cystoscopy and PVR  Chief Complaint: Chief Complaint  Patient presents with   Urinary Retention    HPI: Tanner Ferguson is a 89 y.o. male who presents for continued evaluation of gross hematuria and urinary retention. He has a history of prostate cancer s/p brachytherapy (10 + years ago) and prior valve replacement on coumadin first presenting to ED 2/11 with hematuria. He was having difficulty urinating and so a catheter was placed. Reportedly some small clots expressed. U/A at that time with low concern for infection. He was discharged home with a well draining catheter in place and instructed to follow-up with urology. On 2/12 he re-presented to ER with catheter related pain. Catheter draining freely, thin amber urine. INR 4.2. U/A with pyruia. Hgb stable. CT 2/11 without stones/masses and distended bladder without evidence of formed clot.  He was continued with foley drainage while in hospital.  The hematuria visibly cleared.  His foley has been draining well.  He continues on tamsulosin and completed 7 days of Keflex. He presents today for a voiding trial.  His urine has been visibly clear.  No clots.  His catheter has been draining well.  Portions of the above documentation were copied from a prior visit for review purposes only.  Allergies: Allergies  Allergen Reactions   Penicillins Rash    PMH: Past Medical History:  Diagnosis Date   Asthma    CAD (coronary artery disease)    s/p NSTEMI 9/19. Cath in [place], [place] 10/19. Severe CAD not amenable to PCI. Not CABG candidate   CHF (congestive heart failure) (HCC)    EF 40-45% by echo due to iCM. EF 36% by Myoview 9/19   Dementia (HCC)    Essential tremor    Hyperlipidemia    Intolerant of statins   Hypertension    PAF (paroxysmal atrial  fibrillation) ([place])    Pancytopenia ([place])    Follows with hematology   Prostate cancer ([place])    Seizure ([place])     PSH: Past Surgical History:  Procedure Laterality Date   AORTIC VALVE REPLACEMENT     CARDIOVERSION     CATARACT EXTRACTION Bilateral    HERNIA REPAIR      SH: Social History   Tobacco Use   Smoking status: Never   Smokeless tobacco: Former    Types: Chew    Quit date: 10/25/2017  Vaping Use   Vaping Use: Never used  Substance Use Topics   Alcohol use: Not Currently    ROS: Constitutional:  Negative for fever, chills, weight loss CV: Negative for chest pain, previous MI, hypertension Respiratory:  Negative for shortness of breath, wheezing, sleep apnea, frequent cough GI:  Negative for nausea, vomiting, bloody stool, GERD  PE: BP (!) 149/59   Pulse (!) 47   Ht 5' (1.524 m)   Wt 150 lb (68 kg)   BMI 29.29 kg/m  GENERAL APPEARANCE:  Well appearing, well developed, well nourished, NAD HEENT:  Atraumatic, normocephalic, oropharynx clear NECK:  Supple without lymphadenopathy or thyromegaly ABDOMEN:  Soft, non-tender, no masses EXTREMITIES:  Moves all extremities well, without clubbing, cyanosis, or edema NEUROLOGIC:  Alert and oriented x 3, in wheelchair, CN II-XII grossly intact MENTAL STATUS:  appropriate BACK:  Non-tender to palpation, No CVAT SKIN:  Warm, dry,  and intact   Results: None  Procedure:  VOIDING TRIAL  A voiding trial was performed in the office today.   Volume of sterile water instilled: 240 mL Foley catheter removed intact. Volume voided by patient: 140 mL Instructed to return to office if has not voided by 4 PM

## 2022-04-20 NOTE — Progress Notes (Signed)
Fill and Pull Catheter Removal  Patient is present today for a catheter removal.  Patient was cleaned and prepped in a sterile fashion 266m of sterile water/ saline was instilled into the bladder when the patient felt the urge to urinate, 371mof water was then drained from the balloon.  A 22FR foley cath was removed from the bladder no complications were noted .  Patient was then given some time to void on their own.  Patient can void 14019mn their own after some time.  Patient tolerated well.  Performed by: CryBradly BienenstockA

## 2022-04-21 ENCOUNTER — Encounter: Payer: Self-pay | Admitting: Urology

## 2022-04-21 ENCOUNTER — Telehealth: Payer: Self-pay | Admitting: Urology

## 2022-04-21 NOTE — Telephone Encounter (Signed)
Patient's son called and wanted to report that the patient is starting to have difficulty passing urine and the urine that he is passing is darker in color perhaps a small amount of blood. Patient's son wants a call back asap. -lmr.

## 2022-04-21 NOTE — Telephone Encounter (Signed)
I spoke with Tanner Ferguson son today.  He is voiding with some hesitancy.  His urine is brown colored without clots. Discussed pushing PO fluids.  If he is unable to void and becomes uncomfortable, he may need to be evaluated in ER.  Otherwise, I asked his son to update me in the morning.  If necessary, we can see him tomorrow in the office.

## 2022-04-21 NOTE — Telephone Encounter (Signed)
Error

## 2022-04-22 ENCOUNTER — Ambulatory Visit (HOSPITAL_BASED_OUTPATIENT_CLINIC_OR_DEPARTMENT_OTHER): Payer: Medicare Other | Admitting: Cardiology

## 2022-04-23 ENCOUNTER — Encounter: Payer: Self-pay | Admitting: Urology

## 2022-04-23 ENCOUNTER — Other Ambulatory Visit: Payer: Self-pay

## 2022-04-23 ENCOUNTER — Telehealth: Payer: Self-pay | Admitting: Urology

## 2022-04-23 ENCOUNTER — Ambulatory Visit (INDEPENDENT_AMBULATORY_CARE_PROVIDER_SITE_OTHER): Payer: Medicare Other

## 2022-04-23 DIAGNOSIS — Z5181 Encounter for therapeutic drug level monitoring: Secondary | ICD-10-CM

## 2022-04-23 LAB — POCT INR: INR: 2.9 (ref 2.0–3.0)

## 2022-04-23 MED ORDER — CEFDINIR 300 MG PO CAPS: 300.0000 mg | ORAL_CAPSULE | Freq: Two times a day (BID) | ORAL | 0 refills | Status: DC

## 2022-04-23 NOTE — Telephone Encounter (Signed)
Patient's son called & stated patient is experiencing urinary frequency, pain, pain w/ urination & blood in pull up. Patient's son stated: Does he have UTI?  Do you want to call in an antibiotic?  Patient's son stated he also sent you a MyChart message... - lmr.

## 2022-04-23 NOTE — Patient Instructions (Signed)
Description   Spoke with pt's son, Tanner Ferguson, and instructed for pt to continue taking warfarin 1/2 tablet daily except for 1 tablet on Sunday, Tuesday, and Thursday. Stay consistent with greens (1-2 timers per week).   Recheck INR on Monday (self tester, checks weekly).  If pt starts Cipro, eat EXTRA greens on Saturday and Sunday

## 2022-04-26 ENCOUNTER — Ambulatory Visit (INDEPENDENT_AMBULATORY_CARE_PROVIDER_SITE_OTHER): Payer: Medicare Other

## 2022-04-26 DIAGNOSIS — Z5181 Encounter for therapeutic drug level monitoring: Secondary | ICD-10-CM

## 2022-04-26 LAB — POCT INR: INR: 2.4 (ref 2.0–3.0)

## 2022-04-26 NOTE — Patient Instructions (Addendum)
Description   Spoke with pt's son, Dominica Severin, and instructed for pt to take 1.5 tablets today and then continue taking warfarin 1/2 tablet daily except for 1 tablet on Sunday, Tuesday, and Thursday. Stay consistent with greens (1-2 timers per week).   Recheck INR in 1 week. (self tester, checks weekly).  [facility] [phone]

## 2022-04-28 ENCOUNTER — Ambulatory Visit (INDEPENDENT_AMBULATORY_CARE_PROVIDER_SITE_OTHER): Payer: Medicare Other | Admitting: Urology

## 2022-04-28 ENCOUNTER — Encounter: Payer: Self-pay | Admitting: Urology

## 2022-04-28 VITALS — BP 161/75 | HR 58 | Ht 60.0 in | Wt 150.0 lb

## 2022-04-28 DIAGNOSIS — R310 Gross hematuria: Secondary | ICD-10-CM | POA: Diagnosis not present

## 2022-04-28 DIAGNOSIS — R339 Retention of urine, unspecified: Secondary | ICD-10-CM

## 2022-04-28 DIAGNOSIS — Z8546 Personal history of malignant neoplasm of prostate: Secondary | ICD-10-CM

## 2022-04-28 DIAGNOSIS — R896 Abnormal cytological findings in specimens from other organs, systems and tissues: Secondary | ICD-10-CM | POA: Diagnosis not present

## 2022-04-28 DIAGNOSIS — Z87898 Personal history of other specified conditions: Secondary | ICD-10-CM

## 2022-04-28 LAB — URINALYSIS
Bilirubin, UA: NEGATIVE
Glucose, UA: NEGATIVE mg/dL
Ketones, UA: NEGATIVE
Leukocytes, UA: NEGATIVE
Nitrite, UA: NEGATIVE
Protein, UA: POSITIVE — AB
Spec Grav, UA: >=1.03 — AB (ref 1.010–1.025)
Urobilinogen, UA: 1 E.U./dL
pH, UA: 6 (ref 5.0–8.0)

## 2022-04-28 MED ORDER — FINASTERIDE 5 MG PO TABS: 5.0000 mg | ORAL_TABLET | Freq: Every day | ORAL | 5 refills | Status: DC

## 2022-04-28 NOTE — Progress Notes (Signed)
Assessment: 1. Gross hematuria   2. History of prostate cancer   3. History of urinary retention     Plan: I discussed the findings on cystoscopy with the patient and his son in detail today.  The bleeding appears to be from the prostatic urethra.  He does have some friable vessels in this area as well as apparent irritation from the previous indwelling Foley catheter.  He does not have any evidence of bleeding or blood clots within the bladder. I discussed options for management with his son.  I recommended a trial of medical therapy first and if unsuccessful consideration could be given to surgical management if the bleeding persist. Urine cytology sent today. Complete antibiotics Begin finasteride 5 mg daily.  Rx sent. Return to office in 2-3 weeks  Chief Complaint: Chief Complaint  Patient presents with   Cysto    HPI: Tanner Ferguson is a 89 y.o. male who presents for continued evaluation of gross hematuria and urinary retention. He has a history of prostate cancer s/p brachytherapy (10 + years ago) and prior valve replacement on coumadin first presenting to ED 2/11 with hematuria. He was having difficulty urinating and so a catheter was placed. Reportedly some small clots expressed. U/A at that time with low concern for infection. He was discharged home with a well draining catheter in place and instructed to follow-up with urology. On 2/12 he re-presented to ER with catheter related pain. Catheter draining freely, thin amber urine. INR 4.2. U/A with pyruia. Hgb stable. CT 2/11 without stones/masses and distended bladder without evidence of formed clot.  He was continued with foley drainage while in hospital.  The hematuria visibly cleared.  His foley has been draining well.  He continues on tamsulosin and completed 7 days of Keflex. At his visit on 04/20/22, his urine was visibly clear.  No clots.  His catheter had been draining well. His foley was removed on 04/20/22 following a  successful voiding trial.  He has been voiding at home.  He has had intermittent gross hematuria.  He was started on Cefdinir on 04/23/22 for possible UTI.  He presents today for cystoscopy. He is voiding spontaneously but is having intermittent gross hematuria with occasional clots.  No dysuria or flank pain.  Portions of the above documentation were copied from a prior visit for review purposes only.  Allergies: Allergies  Allergen Reactions   Penicillins Rash    PMH: Past Medical History:  Diagnosis Date   Asthma    CAD (coronary artery disease)    s/p NSTEMI 9/19. Cath in [place], [place] 10/19. Severe CAD not amenable to PCI. Not CABG candidate   CHF (congestive heart failure) (HCC)    EF 40-45% by echo due to iCM. EF 36% by Myoview 9/19   Dementia (HCC)    Essential tremor    Hyperlipidemia    Intolerant of statins   Hypertension    PAF (paroxysmal atrial fibrillation) ([place])    Pancytopenia ([place])    Follows with hematology   Prostate cancer ([place])    Seizure ([place])     PSH: Past Surgical History:  Procedure Laterality Date   AORTIC VALVE REPLACEMENT     CARDIOVERSION     CATARACT EXTRACTION Bilateral    HERNIA REPAIR      SH: Social History   Tobacco Use   Smoking status: Never   Smokeless tobacco: Former    Types: Chew    Quit date: 10/25/2017  Vaping Use   Vaping  Use: Never used  Substance Use Topics   Alcohol use: Not Currently    ROS: Constitutional:  Negative for fever, chills, weight loss CV: Negative for chest pain, previous MI, hypertension Respiratory:  Negative for shortness of breath, wheezing, sleep apnea, frequent cough GI:  Negative for nausea, vomiting, bloody stool, GERD  PE: BP (!) 161/75   Pulse (!) 58   Ht 5' (1.524 m)   Wt 150 lb (68 kg)   BMI 29.29 kg/m  GENERAL APPEARANCE:  Well appearing, well developed, well nourished, NAD HEENT:  Atraumatic, normocephalic, oropharynx clear NECK:  Supple without lymphadenopathy or  thyromegaly ABDOMEN:  Soft, non-tender, no masses EXTREMITIES:  Moves all extremities well, without clubbing, cyanosis, or edema NEUROLOGIC:  Alert and oriented x 3, normal gait, CN II-XII grossly intact MENTAL STATUS:  appropriate BACK:  Non-tender to palpation, No CVAT SKIN:  Warm, dry, and intact   Results: U/A dipstick: yellow with small clot, 3+ blood, 2+ protein  Procedure:  Flexible Cystourethroscopy  Pre-operative Diagnosis: Gross hematuria  Post-operative Diagnosis: Gross hematuria  Anesthesia:  local with lidocaine jelly  Surgical Narrative:  After appropriate informed consent was obtained, the patient was prepped and draped in the usual sterile fashion in the supine position.  The patient was correctly identified and the proper procedure delineated prior to proceeding.  Sterile lidocaine gel was instilled in the urethra. The flexible cystoscope was introduced without difficulty.  Findings:  Anterior urethra: Normal  Posterior urethra:  Increased vascularity; changes consistent with prior radiation and recent catheterization; no active bleeding noted; small clot noted laterally.  Bladder: Trabeculations; no papillary lesion seen; erythema of the posterior wall consistent with prior Foley  Ureteral orifices:  Clear efflux seen bilaterally  Additional findings: none  Saline bladder wash for cytology was performed.    The cystoscope was then removed.  The patient tolerated the procedure well.

## 2022-04-30 ENCOUNTER — Other Ambulatory Visit: Payer: Medicare Other | Admitting: Urology

## 2022-05-03 ENCOUNTER — Ambulatory Visit (INDEPENDENT_AMBULATORY_CARE_PROVIDER_SITE_OTHER): Payer: Medicare Other | Admitting: *Deleted

## 2022-05-03 DIAGNOSIS — I480 Paroxysmal atrial fibrillation: Secondary | ICD-10-CM

## 2022-05-03 DIAGNOSIS — Z7901 Long term (current) use of anticoagulants: Secondary | ICD-10-CM

## 2022-05-03 DIAGNOSIS — Z952 Presence of prosthetic heart valve: Secondary | ICD-10-CM

## 2022-05-03 LAB — POCT INR: INR: 2.4 (ref 2.0–3.0)

## 2022-05-03 NOTE — Patient Instructions (Signed)
Description   Spoke with pt's son, Dominica Severin, and instructed for pt to take 1 tablet today and then START taking warfarin 1 tablet daily except for 1/2 tablet on Monday, Wednesday, and Friday. Stay consistent with greens (1-2 timers per week).   Recheck INR in 1 week. (self tester, checks weekly).  [facility] [phone]

## 2022-05-04 ENCOUNTER — Encounter (HOSPITAL_BASED_OUTPATIENT_CLINIC_OR_DEPARTMENT_OTHER): Payer: Self-pay | Admitting: Cardiology

## 2022-05-04 ENCOUNTER — Ambulatory Visit (INDEPENDENT_AMBULATORY_CARE_PROVIDER_SITE_OTHER): Payer: Medicare Other | Admitting: Cardiology

## 2022-05-04 VITALS — BP 138/64 | HR 48 | Ht 60.0 in | Wt 148.0 lb

## 2022-05-04 DIAGNOSIS — Z952 Presence of prosthetic heart valve: Secondary | ICD-10-CM | POA: Diagnosis not present

## 2022-05-04 DIAGNOSIS — I480 Paroxysmal atrial fibrillation: Secondary | ICD-10-CM | POA: Diagnosis not present

## 2022-05-04 DIAGNOSIS — I25118 Atherosclerotic heart disease of native coronary artery with other forms of angina pectoris: Secondary | ICD-10-CM | POA: Diagnosis not present

## 2022-05-04 DIAGNOSIS — Z7901 Long term (current) use of anticoagulants: Secondary | ICD-10-CM

## 2022-05-04 DIAGNOSIS — I255 Ischemic cardiomyopathy: Secondary | ICD-10-CM | POA: Diagnosis not present

## 2022-05-04 NOTE — Patient Instructions (Signed)
Medication Instructions:  Your physician recommends that you continue on your current medications as directed. Please refer to the Current Medication list given to you today.   *If you need a refill on your cardiac medications before your next appointment, please call your pharmacy*  Lab Work: NONE  Testing/Procedures: NONE   Follow-Up: At [facility], you and your health needs are our priority.  As part of our continuing mission to provide you with exceptional heart care, we have created designated Provider Care Teams.  These Care Teams include your primary Cardiologist (physician) and Advanced Practice Providers (APPs -  Physician Assistants and Nurse Practitioners) who all work together to provide you with the care you need, when you need it.  We recommend signing up for the patient portal called "MyChart".  Sign up information is provided on this After Visit Summary.  MyChart is used to connect with patients for Virtual Visits (Telemedicine).  Patients are able to view lab/test results, encounter notes, upcoming appointments, etc.  Non-urgent messages can be sent to your provider as well.   To learn more about what you can do with MyChart, go to [email or web address].    Your next appointment:   6 month(s)  The format for your next appointment:   In Person  Provider:   Bridgette Christopher, MD             

## 2022-05-04 NOTE — Progress Notes (Unsigned)
Cardiology Office Note   Date:  05/05/2022   ID:  [name], DOB 19-Apr-1927, MRN [id number]  PCP:  Wenda Low, MD  Cardiologist:  Buford Dresser, MD PhD  Referring MD: Wenda Low, MD   CC: follow up  History of Present Illness:    Tanner Ferguson is a 89 y.o. male with a hx of aortic stenosis s/p mechanical AVR (1995), CAD with MI 11/2017, ischemic cardiomyopathy, paroxysmal atrial fibrillation, obstructive sleep apnea, hypertension, hyperlipidemia  who is seen for follow up today. Please see my initial note from 03/09/18 for full summary of his cardiac history.  Additional pertinent information:  -seen by Dr. Vaughan Browner 09/28/18. Plan is for continued monitoring of the asbestosis given mild fibrosis, pleural plaques, and minimal restriction/diffusion defects on PFTs. Does not think imaging is typical for amiodarone toxicity, ok to continue. Possibly has mild asthma, planned for allergy referral. Started on AutoPAP for sleep apnea.  Today: Recently hospitalized. Had hematuria, discharged 2.18.24. Has followed up with Dr. Felipa Eth in urology, foley removed 2.20.24. Planned for cystoscopy and PVR in the future.  Reviewed hospital chart. Received 1 dose of SQ vitamin K 3 mg. His presenting INR was 4.2, has mechanical valve. INR dropped to 1.5 while admitted, started on heparin until he was therapeutic. Had sundowning while admitted.  From a heart standpoint, he has been stable otherwise. Son has been understandably very frustrated with hospitalization and follow up. Patient is still having hematuria.   Discussed wishes, goals of care today. They have discussed what they want, and if it is his time, he is ready to go.   ECG today is sinus bradycardia, heavy artifact from tremor.  Past Medical History:  Diagnosis Date   Asthma    CAD (coronary artery disease)    s/p NSTEMI 9/19. Cath in [place], [place] 10/19. Severe CAD not amenable to PCI. Not CABG candidate   CHF (congestive  heart failure) (HCC)    EF 40-45% by echo due to iCM. EF 36% by Myoview 9/19   Dementia (HCC)    Essential tremor    Hyperlipidemia    Intolerant of statins   Hypertension    PAF (paroxysmal atrial fibrillation) ([place])    Pancytopenia ([place])    Follows with hematology   Prostate cancer ([place])    Seizure ([place])     Current Medications: Current Outpatient Medications on File Prior to Visit  Medication Sig   albuterol (VENTOLIN HFA) 108 (90 Base) MCG/ACT inhaler Inhale 1-2 puffs into the lungs every 4 (four) hours as needed for wheezing or shortness of breath.   amiodarone (PACERONE) 200 MG tablet Take 1/2 (one-half) tablet by mouth once daily (Patient taking differently: Take 100 mg by mouth daily.)   aspirin EC 81 MG tablet Take 1 tablet (81 mg total) by mouth daily with breakfast.   cholecalciferol (VITAMIN D3) 25 MCG (1000 UNIT) tablet Take 1,000 Units by mouth daily.   finasteride (PROSCAR) 5 MG tablet Take 1 tablet (5 mg total) by mouth daily.   fluticasone (FLONASE) 50 MCG/ACT nasal spray Place 1 spray into both nostrils daily.   guaiFENesin-dextromethorphan (ROBITUSSIN DM) 100-10 MG/5ML syrup Take 5 mLs by mouth every 4 (four) hours as needed for cough.   ipratropium-albuterol (DUONEB) 0.5-2.5 (3) MG/3ML SOLN Take 3 mLs by nebulization every 6 (six) hours as needed.   rosuvastatin (CRESTOR) 20 MG tablet Take 1 tablet (20 mg total) by mouth every evening.   sodium chloride (OCEAN) 0.65 % SOLN nasal spray Place 1  spray into both nostrils as needed for congestion.   SYMBICORT 80-4.5 MCG/ACT inhaler Inhale 2 puffs by mouth twice daily   tamsulosin (FLOMAX) 0.4 MG CAPS capsule Take 0.4 mg by mouth daily after supper.    warfarin (COUMADIN) 2.5 MG tablet Take 0.5-1 tablet daily or as prescribed by [facility] (Patient taking differently: Take 1.25-2.5 mg by mouth daily. 2.5 mg on Sunday, Tuesday,Thursday,  1.25 mg on Monday, Wednesday, Friday & Saturday)   No current  facility-administered medications on file prior to visit.     Allergies:   Penicillins   Social History   Tobacco Use   Smoking status: Never   Smokeless tobacco: Former    Types: Chew    Quit date: 10/25/2017  Vaping Use   Vaping Use: Never used  Substance Use Topics   Alcohol use: Not Currently    Family History: No FH of premature CAD  ROS:   Please see the history of present illness.   Additional pertinent ROS otherwise unremarkable.  EKGs/Labs/Other Studies Reviewed:    The following studies were reviewed today: Echo 04/13/22 1. Left ventricular ejection fraction, by estimation, is 55%. The left ventricle has low normal function. The left ventricle demonstrates regional wall motion abnormalities (see scoring diagram/findings for description). There is hypokinesis of the apical inferoseptal LV segment and apex that is best appreciated on definity imaging.There is severe hypertrophy of the basal septal segment. The rest of the LV segments demonstrate mild concentric left ventricular  hypertrophy. Left ventricular diastolic  parameters are consistent with Grade I diastolic dysfunction (impaired relaxation).   2. Right ventricular systolic function is low normal. The right ventricular size is mildly enlarged.   3. Left atrial size was severely dilated.   4. Right atrial size was mildly dilated.   5. The mitral valve is grossly normal. Mild mitral valve regurgitation.  No evidence of mitral stenosis.   6. The aortic valve has been repaired/replaced. There is a 25 mm [employer] mechanical valve present in the aortic position. Procedure Date: 1995. Echo findings are consistent with normal structure and function of the aortic valve prosthesis. Aortic valve  mean gradient measures 5.5 mmHg. Aortic valve Vmax measures 1.56 m/s. DI 0.5. EOA 1.5cm2. Mild perivalvular leak.   7. Tricuspid valve regurgitation is moderate.   8. Aortic dilatation noted. There is borderline dilatation of the  ascending aorta, measuring 38 mm.   Comparison(s): Prior images reviewed side by side. Compared to prior TTE in 2020, EF appears slightly less robust with WMA as detailed above (prior TTE with EF 60%) with stable aortic valve gradients.   CT Chest  06/13/2020: FINDINGS: Cardiovascular: Aortic atherosclerosis. Status post aortic valve replacement and probable graft repair of the tubular ascending thoracic aorta, maximum caliber 4.5 x 4.5 cm. Descending thoracic aorta measures up to 3.3 x 3.2 cm in caliber. Cardiomegaly. Extensive Three-vessel coronary artery calcifications and/or stents. No pericardial effusion.   Mediastinum/Nodes: No enlarged mediastinal, hilar, or axillary lymph nodes. Thyroid gland, trachea, and esophagus demonstrate no significant findings.   Lungs/Pleura: Redemonstrated mild pulmonary fibrosis in a pattern featuring irregular peripheral interstitial opacity, septal thickening, and probable small areas of subpleural bronchiolectasis in the lung bases. No evidence of honeycombing. Fibrotic findings are not significantly changed compared to prior examination dated 2020. No significant air trapping on expiratory phase imaging. Stable, definitively benign small nodules, for example a 4 mm nodule of the right upper lobe (series 7, image 73). Calcified bilateral pleural plaques. No pleural effusion  or pneumothorax.   Upper Abdomen: No acute abnormality.   Musculoskeletal: No chest wall mass or suspicious bone lesions identified.   IMPRESSION: 1. Redemonstrated mild pulmonary fibrosis in a pattern featuring irregular peripheral interstitial opacity, septal thickening, and probable small areas of subpleural bronchiolectasis in the lung bases. No evidence of honeycombing. Fibrotic findings are not significantly changed compared to prior examination dated 2020 and remain consistent with a "probable UIP pattern" by ATS pulmonary fibrosis criteria. 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 2. Cardiomegaly and coronary artery disease. 3. Status post aortic valve replacement and probable graft repair of the tubular ascending thoracic aorta, maximum caliber 4.5 x 4.5 cm, unchanged. Descending thoracic aorta measures up to 3.3 x 3.1 cm, unchanged. Ascending thoracic aortic aneurysm. Recommend semi-annual imaging followup by CTA or MRA and referral to cardiothoracic surgery if not already obtained. This recommendation follows 2010 ACCF/AHA/AATS/ACR/ASA/SCA/SCAI/SIR/STS/SVM Guidelines for the Diagnosis and Management of Patients With Thoracic Aortic Disease. Circulation. 2010; 121ML:4928372. Aortic aneurysm NOS (ICD10-I71.9)   Aortic Atherosclerosis (ICD10-I70.0).  Echo 11/08/2018:  1. The left ventricle has normal systolic function with an ejection  fraction of 60-65%. The cavity size was normal. There is mildly increased  left ventricular wall thickness with severe basal septal hypertrophy.  There is apical and apical anterior  akinesis.   2. The right ventricle has normal systolic function. The cavity was  mildly enlarged. There is no increase in right ventricular wall thickness.   3. A Mechanical AVR valve is present in the aortic position. Procedure  Date: 1995 Normal aortic valve prosthesis. The prosthesis is not well  visualized. The mean AVG is 3mHg. There is trivial perivalvular AI.   4. There is mild to moderate mitral annular calcification present and  mild MR.   5. There is mild dilatation of the ascending aorta measuring 40 mm.   6. Right atrial size was mildly dilated.   7. Left atrial size was moderately dilated.   EKG:  EKG is personally reviewed.   3.5.23: significant artifact due to tremor, likely sinus bradycardia at 48 bpm.  08/17/2021:  sinus bradycardia with first degree AV block, 56 bpm 03/23/21: sinus bradycardia  with first degree AV block, 50 bpm 09/17/2020: Sinus bradycardia with 1st degree AV block, 52 bpm 09/20/19: sinus bradycardia at 51 bpm  Recent Labs: 08/19/2021: B Natriuretic Peptide 243.2 04/13/2022: ALT 12 04/14/2022: TSH 6.894 04/18/2022: BUN 33; Creatinine, Ser 1.43; Hemoglobin 10.1; Magnesium 2.1; Platelets 112; Potassium 4.2; Sodium 133   Recent Lipid Panel No results found for: "CHOL", "TRIG", "HDL", "CHOLHDL", "VLDL", "LDLCALC", "LDLDIRECT"  Physical Exam:    VS:  BP 138/64 (BP Location: Left Arm, Patient Position: Sitting, Cuff Size: Normal)   Pulse (!) 48   Ht 5' (1.524 m)   Wt 148 lb (67.1 kg)   BMI 28.90 kg/m     Wt Readings from Last 3 Encounters:  05/04/22 148 lb (67.1 kg)  04/28/22 150 lb (68 kg)  04/20/22 150 lb (68 kg)    GEN: Well nourished, well developed in no acute distress HEENT: Normal, moist mucous membranes NECK: No JVD CARDIAC: regular rhythm, normal S1 and crisp mechanical S2, no rubs or gallops. 1/6 systolic murmur. VASCULAR: Radial and DP pulses 2+ bilaterally. No carotid bruits RESPIRATORY:  Upper airways clear with bilateral velcro sounds at base. Mild rhonchi ABDOMEN: Soft, non-tender, non-distended MUSCULOSKELETAL:  Ambulates independently SKIN: Warm and dry, no  significant LE edema NEUROLOGIC:  Alert and oriented x 3. No focal neuro deficits noted. Has bilateral tremor PSYCHIATRIC:  Normal affect    ASSESSMENT:    1. Paroxysmal atrial fibrillation (HCC)   2. History of mechanical aortic valve replacement   3. Long term (current) use of anticoagulants   4. Coronary artery disease of native artery of native heart with stable angina pectoris ([place])   5. Ischemic cardiomyopathy     PLAN:    Hematuria, recent hospitalization -reviewed with patient and family today -he has a mechanical valve. Vitamin K/reversal should be reserved for life threatening bleeding only.  Shortness of breath, especially with exertion -most recent echo  reviewed -symptoms have been stable -encouraged exercise as tolerated   Paroxysmal atrial fibrillation -very symptomatic when in afib RVR, required 2 cardioversions 2019 -CHA2DS2/VAS Stroke Risk Points=4, but irrelevant as he needs to be on coumadin for his mechanical valve. -continue amiodarone. See prior extensive discussions regarding this, especially given his pulmonary disease. -followed by coumadin clinic   Prior chronic systolic heart failure, 2/2 ischemic cardiomyopathy. -euvolemic -no beta blocker due to bradycardia -have tried ACEi and ARB, no room given baseline low blood pressures. -given baseline elevated potassium, no room for spironolactone   CAD s/p NSTEMI, with diffuse severe disease seen on cath -continue aspirin -continue rosuvastatin -resting sinus bradycardia. No room for beta blocker -medical management   History of mechanical AVR, on coumadin -continue coumadin and aspirin -SBE prophylaxis with dental procedures, but has dentures in.  Plan for follow up: 6 mos or sooner as needed  Medication Adjustments/Labs and Tests Ordered: Current medicines are reviewed at length with the patient today.  Concerns regarding medicines are outlined above.   Orders Placed This Encounter  Procedures   EKG 12-Lead   No orders of the defined types were placed in this encounter.  Patient Instructions  Medication Instructions:  Your physician recommends that you continue on your current medications as directed. Please refer to the Current Medication list given to you today.  *If you need a refill on your cardiac medications before your next appointment, please call your pharmacy*  Lab Work: NONE  Testing/Procedures: NONE  Follow-Up: At [facility], you and your health needs are our priority.  As part of our continuing mission to provide you with exceptional heart care, we have created designated Provider Care Teams.  These Care Teams include your primary  Cardiologist (physician) and Advanced Practice Providers (APPs -  Physician Assistants and Nurse Practitioners) who all work together to provide you with the care you need, when you need it.  We recommend signing up for the patient portal called "MyChart".  Sign up information is provided on this After Visit Summary.  MyChart is used to connect with patients for Virtual Visits (Telemedicine).  Patients are able to view lab/test results, encounter notes, upcoming appointments, etc.  Non-urgent messages can be sent to your provider as well.   To learn more about what you can do with MyChart, go to [email or web address].    Your next appointment:   6 month(s)  The format for your next appointment:   In Person  Provider:   Buford Dresser, MD       Signed, Buford Dresser, MD PhD 05/05/2022     [place]

## 2022-05-05 ENCOUNTER — Encounter (HOSPITAL_BASED_OUTPATIENT_CLINIC_OR_DEPARTMENT_OTHER): Payer: Self-pay | Admitting: Cardiology

## 2022-05-06 DIAGNOSIS — R319 Hematuria, unspecified: Secondary | ICD-10-CM | POA: Diagnosis not present

## 2022-05-06 DIAGNOSIS — D649 Anemia, unspecified: Secondary | ICD-10-CM | POA: Diagnosis not present

## 2022-05-06 DIAGNOSIS — I700 Atherosclerosis of aorta: Secondary | ICD-10-CM | POA: Diagnosis not present

## 2022-05-06 DIAGNOSIS — Z954 Presence of other heart-valve replacement: Secondary | ICD-10-CM | POA: Diagnosis not present

## 2022-05-06 DIAGNOSIS — F0390 Unspecified dementia without behavioral disturbance: Secondary | ICD-10-CM | POA: Diagnosis not present

## 2022-05-06 DIAGNOSIS — N1831 Chronic kidney disease, stage 3a: Secondary | ICD-10-CM | POA: Diagnosis not present

## 2022-05-06 DIAGNOSIS — D6869 Other thrombophilia: Secondary | ICD-10-CM | POA: Diagnosis not present

## 2022-05-06 DIAGNOSIS — I10 Essential (primary) hypertension: Secondary | ICD-10-CM | POA: Diagnosis not present

## 2022-05-10 ENCOUNTER — Encounter: Payer: Self-pay | Admitting: Urology

## 2022-05-10 ENCOUNTER — Ambulatory Visit (INDEPENDENT_AMBULATORY_CARE_PROVIDER_SITE_OTHER): Payer: Medicare Other

## 2022-05-10 DIAGNOSIS — Z5181 Encounter for therapeutic drug level monitoring: Secondary | ICD-10-CM | POA: Diagnosis not present

## 2022-05-10 DIAGNOSIS — Z7901 Long term (current) use of anticoagulants: Secondary | ICD-10-CM | POA: Diagnosis not present

## 2022-05-10 DIAGNOSIS — Z952 Presence of prosthetic heart valve: Secondary | ICD-10-CM | POA: Diagnosis not present

## 2022-05-10 DIAGNOSIS — I480 Paroxysmal atrial fibrillation: Secondary | ICD-10-CM | POA: Diagnosis not present

## 2022-05-10 LAB — POCT INR: INR: 3 (ref 2.0–3.0)

## 2022-05-10 NOTE — Patient Instructions (Signed)
Description   Spoke with pt's son, Dominica Severin, and instructed for pt to continue taking warfarin 1 tablet daily except for 1/2 tablet on Monday, Wednesday, and Friday. Stay consistent with greens (1-2 timers per week).   Recheck INR in 1 week. (self tester, checks weekly).  [facility] [phone]

## 2022-05-17 ENCOUNTER — Ambulatory Visit (INDEPENDENT_AMBULATORY_CARE_PROVIDER_SITE_OTHER): Payer: Medicare Other | Admitting: *Deleted

## 2022-05-17 DIAGNOSIS — Z7901 Long term (current) use of anticoagulants: Secondary | ICD-10-CM

## 2022-05-17 DIAGNOSIS — Z952 Presence of prosthetic heart valve: Secondary | ICD-10-CM

## 2022-05-17 DIAGNOSIS — I480 Paroxysmal atrial fibrillation: Secondary | ICD-10-CM

## 2022-05-17 LAB — POCT INR: INR: 3.7 — AB (ref 2.0–3.0)

## 2022-05-17 NOTE — Patient Instructions (Signed)
Description   Spoke with pt's son, Dominica Severin, and instructed for pt to take 1/2 tablet of warfarin tomorrow then continue taking warfarin 1 tablet daily except for 1/2 tablet on Monday, Wednesday, and Friday. Recheck INR in 1 week. (self tester, checks weekly).  [facility] [phone]

## 2022-05-20 ENCOUNTER — Encounter: Payer: Self-pay | Admitting: Urology

## 2022-05-20 ENCOUNTER — Ambulatory Visit (INDEPENDENT_AMBULATORY_CARE_PROVIDER_SITE_OTHER): Payer: Medicare Other | Admitting: Urology

## 2022-05-20 VITALS — BP 175/74 | HR 46 | Ht 60.0 in | Wt 150.0 lb

## 2022-05-20 DIAGNOSIS — R310 Gross hematuria: Secondary | ICD-10-CM

## 2022-05-20 DIAGNOSIS — R339 Retention of urine, unspecified: Secondary | ICD-10-CM

## 2022-05-20 DIAGNOSIS — R8290 Unspecified abnormal findings in urine: Secondary | ICD-10-CM | POA: Diagnosis not present

## 2022-05-20 DIAGNOSIS — Z8546 Personal history of malignant neoplasm of prostate: Secondary | ICD-10-CM | POA: Diagnosis not present

## 2022-05-20 DIAGNOSIS — Z87898 Personal history of other specified conditions: Secondary | ICD-10-CM

## 2022-05-20 LAB — URINALYSIS, ROUTINE W REFLEX MICROSCOPIC
Bilirubin, UA: NEGATIVE
Glucose, UA: NEGATIVE
Ketones, UA: NEGATIVE
Leukocytes,UA: NEGATIVE
Nitrite, UA: NEGATIVE
Specific Gravity, UA: 1.025 (ref 1.005–1.030)
Urobilinogen, Ur: 2 mg/dL — ABNORMAL HIGH (ref 0.2–1.0)
pH, UA: 6.5 (ref 5.0–7.5)

## 2022-05-20 LAB — MICROSCOPIC EXAMINATION
Cast Type: NONE SEEN
Casts: NONE SEEN /lpf
Crystal Type: NONE SEEN
Crystals: NONE SEEN
Renal Epithel, UA: NONE SEEN /hpf
Trichomonas, UA: NONE SEEN
Yeast, UA: NONE SEEN

## 2022-05-20 NOTE — Progress Notes (Signed)
Assessment: 1. Gross hematuria   2. History of prostate cancer   3. History of urinary retention   4. Abnormal urine findings     Plan: Urine culture sent today - will call with results Continue finasteride and tamsulosin Return to office in 2 months  Chief Complaint: Chief Complaint  Patient presents with   Hematuria    HPI: Tanner Ferguson is a 89 y.o. male who presents for continued evaluation of gross hematuria and urinary retention. He has a history of prostate cancer s/p brachytherapy (10 + years ago) and prior valve replacement on coumadin first presenting to ED 2/11 with hematuria. He was having difficulty urinating and so a catheter was placed. Reportedly some small clots expressed. U/A at that time with low concern for infection. He was discharged home with a well draining catheter in place and instructed to follow-up with urology. On 2/12 he re-presented to ER with catheter related pain. Catheter draining freely, thin amber urine. INR 4.2. U/A with pyruia. Hgb stable. CT 2/11 without stones/masses and distended bladder without evidence of formed clot.  He was continued with foley drainage while in hospital.  The hematuria visibly cleared.  His foley has been draining well.  He continues on tamsulosin and completed 7 days of Keflex. At his visit on 04/20/22, his urine was visibly clear.  No clots.  His catheter had been draining well. His foley was removed on 04/20/22 following a successful voiding trial.  He has been voiding at home.  He has had intermittent gross hematuria.  He was started on Cefdinir on 04/23/22 for possible UTI. Cystoscopy from 04/28/22 showed a normal anterior urethra, increased vascularity of the prostatic urethra with changes consistent with prior radiation and recent catheterization without active bleeding, bladder trabeculations, no bladder lesions, and erythema of the posterior bladder wall consistent with prior Foley catheter placement with clear efflux of  urine from bilateral ureters. Urine cytology showed atypical urothelial cells, FISH negative He was started on finasteride 5 mg daily for prostatic bleeding.  He returns today for follow-up.  He continues on tamsulosin and finasteride.  The gross hematuria is slowly improving.  He has not had any gross hematuria for the past several days.  He continues to report some frequency and dysuria. IPSS = 14 today.   Portions of the above documentation were copied from a prior visit for review purposes only.  Allergies: Allergies  Allergen Reactions   Penicillins Rash    PMH: Past Medical History:  Diagnosis Date   Asthma    CAD (coronary artery disease)    s/p NSTEMI 9/19. Cath in [place], [place] 10/19. Severe CAD not amenable to PCI. Not CABG candidate   CHF (congestive heart failure) (HCC)    EF 40-45% by echo due to iCM. EF 36% by Myoview 9/19   Dementia (HCC)    Essential tremor    Hyperlipidemia    Intolerant of statins   Hypertension    PAF (paroxysmal atrial fibrillation) ([place])    Pancytopenia ([place])    Follows with hematology   Prostate cancer ([place])    Seizure ([place])     PSH: Past Surgical History:  Procedure Laterality Date   AORTIC VALVE REPLACEMENT     CARDIOVERSION     CATARACT EXTRACTION Bilateral    HERNIA REPAIR      SH: Social History   Tobacco Use   Smoking status: Never   Smokeless tobacco: Former    Types: Chew    Quit date: 10/25/2017  Vaping Use   Vaping Use: Never used  Substance Use Topics   Alcohol use: Not Currently    ROS: Constitutional:  Negative for fever, chills, weight loss CV: Negative for chest pain, previous MI, hypertension Respiratory:  Negative for shortness of breath, wheezing, sleep apnea, frequent cough GI:  Negative for nausea, vomiting, bloody stool, GERD  PE: BP (!) 175/74   Pulse (!) 46   Ht 5' (1.524 m)   Wt 150 lb (68 kg)   BMI 29.29 kg/m  GENERAL APPEARANCE:  Well appearing, well developed, well nourished,  NAD HEENT:  Atraumatic, normocephalic, oropharynx clear NECK:  Supple without lymphadenopathy or thyromegaly ABDOMEN:  Soft, non-tender, no masses EXTREMITIES:  Moves all extremities well, without clubbing, cyanosis, or edema NEUROLOGIC:  Alert and oriented x 3, in wheelchair, CN II-XII grossly intact MENTAL STATUS:  appropriate BACK:  Non-tender to palpation, No CVAT SKIN:  Warm, dry, and intact   Results: U/A: 0-5 WBC, 11-30 RBC, few bacteria

## 2022-05-21 LAB — URINE CULTURE

## 2022-05-24 ENCOUNTER — Ambulatory Visit (INDEPENDENT_AMBULATORY_CARE_PROVIDER_SITE_OTHER): Payer: Medicare Other

## 2022-05-24 ENCOUNTER — Encounter: Payer: Self-pay | Admitting: Urology

## 2022-05-24 DIAGNOSIS — Z7901 Long term (current) use of anticoagulants: Secondary | ICD-10-CM | POA: Diagnosis not present

## 2022-05-24 DIAGNOSIS — Z952 Presence of prosthetic heart valve: Secondary | ICD-10-CM | POA: Diagnosis not present

## 2022-05-24 DIAGNOSIS — I480 Paroxysmal atrial fibrillation: Secondary | ICD-10-CM | POA: Diagnosis not present

## 2022-05-24 LAB — POCT INR: INR: 3.4 — AB (ref 2.0–3.0)

## 2022-05-24 NOTE — Patient Instructions (Signed)
Description   Spoke with pt's son, Dominica Severin, and instructed for pt to continue taking warfarin 1 tablet daily except for 1/2 tablet on Monday, Wednesday, and Friday.  Recheck INR in 1 week. (self tester, checks weekly).  [facility] [phone]

## 2022-05-31 ENCOUNTER — Ambulatory Visit (INDEPENDENT_AMBULATORY_CARE_PROVIDER_SITE_OTHER): Payer: Medicare Other | Admitting: *Deleted

## 2022-05-31 DIAGNOSIS — Z7901 Long term (current) use of anticoagulants: Secondary | ICD-10-CM | POA: Diagnosis not present

## 2022-05-31 DIAGNOSIS — I480 Paroxysmal atrial fibrillation: Secondary | ICD-10-CM

## 2022-05-31 DIAGNOSIS — Z952 Presence of prosthetic heart valve: Secondary | ICD-10-CM | POA: Diagnosis not present

## 2022-05-31 LAB — POCT INR: INR: 3.6 — AB (ref 2.0–3.0)

## 2022-05-31 NOTE — Patient Instructions (Signed)
Description   Spoke with pt's son, Tanner Ferguson, and instructed for pt to take 1/2 tablet tomorrow then continue taking warfarin 1 tablet daily except for 1/2 tablet on Monday, Wednesday, and Friday.  Recheck INR in 1 week. (self tester, checks weekly).  [facility] [phone]

## 2022-06-07 ENCOUNTER — Ambulatory Visit (INDEPENDENT_AMBULATORY_CARE_PROVIDER_SITE_OTHER): Payer: Medicare Other

## 2022-06-07 DIAGNOSIS — Z952 Presence of prosthetic heart valve: Secondary | ICD-10-CM | POA: Diagnosis not present

## 2022-06-07 DIAGNOSIS — I480 Paroxysmal atrial fibrillation: Secondary | ICD-10-CM

## 2022-06-07 DIAGNOSIS — Z7901 Long term (current) use of anticoagulants: Secondary | ICD-10-CM | POA: Diagnosis not present

## 2022-06-07 LAB — POCT INR: INR: 3.9 — AB (ref 2.0–3.0)

## 2022-06-07 NOTE — Patient Instructions (Signed)
Description   Spoke with pt's son, Jillyn Hidden, and instructed for pt to HOLD today's dose and then START taking warfarin 1 tablet daily except for 1/2 tablet on Sunday, Monday, Wednesday, and Friday.  Recheck INR in 1 week. (self tester, checks weekly).  [facility] [phone]

## 2022-06-14 ENCOUNTER — Ambulatory Visit (INDEPENDENT_AMBULATORY_CARE_PROVIDER_SITE_OTHER): Payer: Medicare Other | Admitting: *Deleted

## 2022-06-14 DIAGNOSIS — I480 Paroxysmal atrial fibrillation: Secondary | ICD-10-CM

## 2022-06-14 DIAGNOSIS — Z952 Presence of prosthetic heart valve: Secondary | ICD-10-CM

## 2022-06-14 DIAGNOSIS — Z7901 Long term (current) use of anticoagulants: Secondary | ICD-10-CM | POA: Diagnosis not present

## 2022-06-14 LAB — POCT INR: INR: 3.1 — AB (ref 2.0–3.0)

## 2022-06-14 NOTE — Patient Instructions (Addendum)
Description   Spoke with pt's son, Jillyn Hidden, and instructed for pt to continue taking warfarin 1/2 tablet daily except for 1 tablet on Tuesday, Thursday, and Saturdays.  Recheck INR in 1 week. (self tester, checks weekly).  [facility] [phone]

## 2022-06-21 ENCOUNTER — Ambulatory Visit (INDEPENDENT_AMBULATORY_CARE_PROVIDER_SITE_OTHER): Payer: Medicare Other | Admitting: Pharmacist

## 2022-06-21 DIAGNOSIS — Z952 Presence of prosthetic heart valve: Secondary | ICD-10-CM

## 2022-06-21 DIAGNOSIS — I480 Paroxysmal atrial fibrillation: Secondary | ICD-10-CM | POA: Diagnosis not present

## 2022-06-21 DIAGNOSIS — Z7901 Long term (current) use of anticoagulants: Secondary | ICD-10-CM

## 2022-06-21 LAB — POCT INR: INR: 2.7 (ref 2.0–3.0)

## 2022-06-21 NOTE — Patient Instructions (Signed)
Description   Spoke with pt's son, Gary, and instructed for pt to continue taking warfarin 1/2 tablet daily except for 1 tablet on Tuesday, Thursday, and Saturdays.  Recheck INR in 1 week. (self tester, checks weekly).  [facility] [phone]      

## 2022-06-28 ENCOUNTER — Ambulatory Visit (INDEPENDENT_AMBULATORY_CARE_PROVIDER_SITE_OTHER): Payer: Medicare Other | Admitting: Pharmacist

## 2022-06-28 ENCOUNTER — Telehealth: Payer: Self-pay | Admitting: *Deleted

## 2022-06-28 DIAGNOSIS — I480 Paroxysmal atrial fibrillation: Secondary | ICD-10-CM

## 2022-06-28 DIAGNOSIS — Z7901 Long term (current) use of anticoagulants: Secondary | ICD-10-CM | POA: Diagnosis not present

## 2022-06-28 DIAGNOSIS — Z952 Presence of prosthetic heart valve: Secondary | ICD-10-CM

## 2022-06-28 DIAGNOSIS — I4891 Unspecified atrial fibrillation: Secondary | ICD-10-CM | POA: Diagnosis not present

## 2022-06-28 LAB — POCT INR: INR: 3.1 — AB (ref 2.0–3.0)

## 2022-06-28 NOTE — Telephone Encounter (Signed)
Called pt since INR is due; there was no answer, therefore, left a voicemail. Will await a call back and follow up.

## 2022-06-28 NOTE — Patient Instructions (Signed)
Description   Spoke with pt's son, Gary, and instructed for pt to continue taking warfarin 1/2 tablet daily except for 1 tablet on Tuesday, Thursday, and Saturdays.  Recheck INR in 1 week. (self tester, checks weekly).  [facility] [phone]      

## 2022-07-05 ENCOUNTER — Ambulatory Visit (INDEPENDENT_AMBULATORY_CARE_PROVIDER_SITE_OTHER): Payer: Medicare Other

## 2022-07-05 DIAGNOSIS — Z952 Presence of prosthetic heart valve: Secondary | ICD-10-CM | POA: Diagnosis not present

## 2022-07-05 DIAGNOSIS — Z7901 Long term (current) use of anticoagulants: Secondary | ICD-10-CM

## 2022-07-05 DIAGNOSIS — I480 Paroxysmal atrial fibrillation: Secondary | ICD-10-CM

## 2022-07-05 LAB — POCT INR: INR: 2.7 (ref 2.0–3.0)

## 2022-07-05 NOTE — Patient Instructions (Signed)
Description   Spoke with pt's son, Gary, and instructed for pt to continue taking warfarin 1/2 tablet daily except for 1 tablet on Tuesday, Thursday, and Saturdays.  Recheck INR in 1 week. (self tester, checks weekly).  [facility] [phone]      

## 2022-07-12 ENCOUNTER — Ambulatory Visit (INDEPENDENT_AMBULATORY_CARE_PROVIDER_SITE_OTHER): Payer: Medicare Other | Admitting: Internal Medicine

## 2022-07-12 DIAGNOSIS — Z7901 Long term (current) use of anticoagulants: Secondary | ICD-10-CM

## 2022-07-12 DIAGNOSIS — I480 Paroxysmal atrial fibrillation: Secondary | ICD-10-CM

## 2022-07-12 DIAGNOSIS — Z952 Presence of prosthetic heart valve: Secondary | ICD-10-CM

## 2022-07-12 LAB — POCT INR: INR: 2.5 (ref 2.0–3.0)

## 2022-07-12 NOTE — Patient Instructions (Addendum)
Description   Spoke with pt's son, Jillyn Hidden, and instructed for pt to take 1 tablet today of warfarin then continue taking warfarin 1/2 tablet daily except for 1 tablet on Tuesday, Thursday, and Saturdays.  Recheck INR in 1 week. (self tester, checks weekly).  [facility] [phone]

## 2022-07-19 ENCOUNTER — Ambulatory Visit (INDEPENDENT_AMBULATORY_CARE_PROVIDER_SITE_OTHER): Payer: Medicare Other | Admitting: Cardiovascular Disease

## 2022-07-19 ENCOUNTER — Telehealth: Payer: Self-pay

## 2022-07-19 DIAGNOSIS — Z952 Presence of prosthetic heart valve: Secondary | ICD-10-CM | POA: Diagnosis not present

## 2022-07-19 DIAGNOSIS — Z7901 Long term (current) use of anticoagulants: Secondary | ICD-10-CM | POA: Diagnosis not present

## 2022-07-19 LAB — POCT INR: INR: 2.8 (ref 2.0–3.0)

## 2022-07-19 NOTE — Telephone Encounter (Signed)
Lp's son message to check INR. 

## 2022-07-20 ENCOUNTER — Telehealth: Payer: Self-pay | Admitting: Urology

## 2022-07-20 NOTE — Telephone Encounter (Signed)
Patient's son called and stated that his father has taken a fall and stated he could not come in for his appointment on 5/23, patient stated his father is doing fine in the Urology area, no issues there. But if Dr Pete Glatter needed to speak with patient, patients son was requesting this to be done with MyChart messages or a virtual visit as patient is not doing well physically but is doing Okay otherwise.  Patient sons callback #: [phone]

## 2022-07-21 ENCOUNTER — Encounter: Payer: Self-pay | Admitting: Urology

## 2022-07-22 ENCOUNTER — Ambulatory Visit: Payer: Medicare Other | Admitting: Urology

## 2022-07-27 ENCOUNTER — Ambulatory Visit (INDEPENDENT_AMBULATORY_CARE_PROVIDER_SITE_OTHER): Payer: Medicare Other | Admitting: Cardiovascular Disease

## 2022-07-27 ENCOUNTER — Telehealth: Payer: Self-pay

## 2022-07-27 DIAGNOSIS — Z7901 Long term (current) use of anticoagulants: Secondary | ICD-10-CM | POA: Diagnosis not present

## 2022-07-27 DIAGNOSIS — Z952 Presence of prosthetic heart valve: Secondary | ICD-10-CM

## 2022-07-27 LAB — POCT INR: INR: 2.7 (ref 2.0–3.0)

## 2022-07-27 NOTE — Telephone Encounter (Signed)
Lp's son message to check INR. 

## 2022-08-02 ENCOUNTER — Telehealth: Payer: Self-pay | Admitting: *Deleted

## 2022-08-02 ENCOUNTER — Ambulatory Visit (INDEPENDENT_AMBULATORY_CARE_PROVIDER_SITE_OTHER): Payer: Medicare Other | Admitting: Cardiology

## 2022-08-02 DIAGNOSIS — I480 Paroxysmal atrial fibrillation: Secondary | ICD-10-CM | POA: Diagnosis not present

## 2022-08-02 DIAGNOSIS — Z7901 Long term (current) use of anticoagulants: Secondary | ICD-10-CM | POA: Diagnosis not present

## 2022-08-02 DIAGNOSIS — Z952 Presence of prosthetic heart valve: Secondary | ICD-10-CM | POA: Diagnosis not present

## 2022-08-02 LAB — POCT INR: INR: 2.7 (ref 2.0–3.0)

## 2022-08-02 NOTE — Telephone Encounter (Signed)
Pt due to have INR checked. Called pt and LMOM.  

## 2022-08-09 ENCOUNTER — Ambulatory Visit (INDEPENDENT_AMBULATORY_CARE_PROVIDER_SITE_OTHER): Payer: Medicare Other | Admitting: *Deleted

## 2022-08-09 DIAGNOSIS — I480 Paroxysmal atrial fibrillation: Secondary | ICD-10-CM

## 2022-08-09 DIAGNOSIS — Z7901 Long term (current) use of anticoagulants: Secondary | ICD-10-CM

## 2022-08-09 DIAGNOSIS — Z952 Presence of prosthetic heart valve: Secondary | ICD-10-CM

## 2022-08-09 LAB — POCT INR: INR: 2.9 (ref 2.0–3.0)

## 2022-08-09 NOTE — Patient Instructions (Signed)
Description   Spoke with pt's son, Jillyn Hidden, and instructed for pt to continue taking warfarin 1/2 tablet daily except for 1 tablet on Tuesday, Thursday, and Saturdays.  Recheck INR in 1 week. (self tester, checks weekly, checks later in the day).  [facility] [phone]

## 2022-08-16 ENCOUNTER — Ambulatory Visit (INDEPENDENT_AMBULATORY_CARE_PROVIDER_SITE_OTHER): Payer: Medicare Other | Admitting: Cardiovascular Disease

## 2022-08-16 DIAGNOSIS — Z7901 Long term (current) use of anticoagulants: Secondary | ICD-10-CM | POA: Diagnosis not present

## 2022-08-16 DIAGNOSIS — Z952 Presence of prosthetic heart valve: Secondary | ICD-10-CM | POA: Diagnosis not present

## 2022-08-16 LAB — POCT INR: INR: 2.8 (ref 2.0–3.0)

## 2022-08-16 NOTE — Patient Instructions (Signed)
Description   Spoke with pt's son, Gary, and instructed for pt to continue taking warfarin 1/2 tablet daily except for 1 tablet on Tuesday, Thursday, and Saturdays.  Recheck INR in 1 week. (self tester, checks weekly, checks later in the day).  [facility] [phone]     

## 2022-08-23 ENCOUNTER — Ambulatory Visit (INDEPENDENT_AMBULATORY_CARE_PROVIDER_SITE_OTHER): Payer: Medicare Other | Admitting: Cardiology

## 2022-08-23 DIAGNOSIS — Z7901 Long term (current) use of anticoagulants: Secondary | ICD-10-CM | POA: Diagnosis not present

## 2022-08-23 DIAGNOSIS — Z952 Presence of prosthetic heart valve: Secondary | ICD-10-CM | POA: Diagnosis not present

## 2022-08-23 LAB — POCT INR: INR: 3.4 — AB (ref 2.0–3.0)

## 2022-08-30 ENCOUNTER — Ambulatory Visit (INDEPENDENT_AMBULATORY_CARE_PROVIDER_SITE_OTHER): Payer: Medicare Other

## 2022-08-30 DIAGNOSIS — Z7901 Long term (current) use of anticoagulants: Secondary | ICD-10-CM

## 2022-08-30 DIAGNOSIS — Z952 Presence of prosthetic heart valve: Secondary | ICD-10-CM | POA: Diagnosis not present

## 2022-08-30 LAB — POCT INR: INR: 3 (ref 2.0–3.0)

## 2022-08-30 NOTE — Patient Instructions (Signed)
Description   Spoke with pt's son, Gary, and instructed for pt to continue taking warfarin 1/2 tablet daily except for 1 tablet on Tuesday, Thursday, and Saturdays.  Recheck INR in 1 week. (self tester, checks weekly, checks later in the day).  [facility] [phone]     

## 2022-09-06 ENCOUNTER — Ambulatory Visit (INDEPENDENT_AMBULATORY_CARE_PROVIDER_SITE_OTHER): Payer: Medicare Other | Admitting: Cardiology

## 2022-09-06 DIAGNOSIS — I480 Paroxysmal atrial fibrillation: Secondary | ICD-10-CM | POA: Diagnosis not present

## 2022-09-06 DIAGNOSIS — Z952 Presence of prosthetic heart valve: Secondary | ICD-10-CM

## 2022-09-06 DIAGNOSIS — Z7901 Long term (current) use of anticoagulants: Secondary | ICD-10-CM

## 2022-09-06 LAB — POCT INR: INR: 3.6 — AB (ref 2.0–3.0)

## 2022-09-13 ENCOUNTER — Ambulatory Visit (INDEPENDENT_AMBULATORY_CARE_PROVIDER_SITE_OTHER): Payer: Medicare Other

## 2022-09-13 DIAGNOSIS — Z7901 Long term (current) use of anticoagulants: Secondary | ICD-10-CM | POA: Diagnosis not present

## 2022-09-13 DIAGNOSIS — I480 Paroxysmal atrial fibrillation: Secondary | ICD-10-CM | POA: Diagnosis not present

## 2022-09-13 DIAGNOSIS — Z952 Presence of prosthetic heart valve: Secondary | ICD-10-CM

## 2022-09-13 LAB — POCT INR: INR: 3.3 — AB (ref 2.0–3.0)

## 2022-09-20 ENCOUNTER — Ambulatory Visit: Payer: Self-pay | Admitting: *Deleted

## 2022-09-20 DIAGNOSIS — I480 Paroxysmal atrial fibrillation: Secondary | ICD-10-CM | POA: Diagnosis not present

## 2022-09-20 DIAGNOSIS — Z7901 Long term (current) use of anticoagulants: Secondary | ICD-10-CM | POA: Diagnosis not present

## 2022-09-20 DIAGNOSIS — Z952 Presence of prosthetic heart valve: Secondary | ICD-10-CM | POA: Diagnosis not present

## 2022-09-20 LAB — POCT INR: INR: 3.5 — AB (ref 2.0–3.0)

## 2022-09-27 ENCOUNTER — Ambulatory Visit (INDEPENDENT_AMBULATORY_CARE_PROVIDER_SITE_OTHER): Payer: Medicare Other | Admitting: Cardiology

## 2022-09-27 DIAGNOSIS — Z952 Presence of prosthetic heart valve: Secondary | ICD-10-CM | POA: Diagnosis not present

## 2022-09-27 DIAGNOSIS — Z7901 Long term (current) use of anticoagulants: Secondary | ICD-10-CM

## 2022-09-27 LAB — POCT INR: INR: 3.7 — AB (ref 2.0–3.0)

## 2022-09-27 NOTE — Patient Instructions (Signed)
Description   Spoke with pt's son, Jillyn Hidden, and instructed for pt to HOLD today's dose and then continue taking warfarin 1/2 tablet daily except for 1 tablet on Tuesday, Thursday, and Saturdays.  Recheck INR in 1 week. (self tester, checks weekly, checks later in the day).  [facility] [phone]

## 2022-09-30 DIAGNOSIS — I700 Atherosclerosis of aorta: Secondary | ICD-10-CM | POA: Diagnosis not present

## 2022-09-30 DIAGNOSIS — D696 Thrombocytopenia, unspecified: Secondary | ICD-10-CM | POA: Diagnosis not present

## 2022-09-30 DIAGNOSIS — G250 Essential tremor: Secondary | ICD-10-CM | POA: Diagnosis not present

## 2022-09-30 DIAGNOSIS — Z1331 Encounter for screening for depression: Secondary | ICD-10-CM | POA: Diagnosis not present

## 2022-09-30 DIAGNOSIS — F0390 Unspecified dementia without behavioral disturbance: Secondary | ICD-10-CM | POA: Diagnosis not present

## 2022-09-30 DIAGNOSIS — Z0000 Encounter for general adult medical examination without abnormal findings: Secondary | ICD-10-CM | POA: Diagnosis not present

## 2022-09-30 DIAGNOSIS — E785 Hyperlipidemia, unspecified: Secondary | ICD-10-CM | POA: Diagnosis not present

## 2022-09-30 DIAGNOSIS — J849 Interstitial pulmonary disease, unspecified: Secondary | ICD-10-CM | POA: Diagnosis not present

## 2022-09-30 DIAGNOSIS — I2510 Atherosclerotic heart disease of native coronary artery without angina pectoris: Secondary | ICD-10-CM | POA: Diagnosis not present

## 2022-09-30 DIAGNOSIS — I4891 Unspecified atrial fibrillation: Secondary | ICD-10-CM | POA: Diagnosis not present

## 2022-09-30 DIAGNOSIS — I10 Essential (primary) hypertension: Secondary | ICD-10-CM | POA: Diagnosis not present

## 2022-09-30 DIAGNOSIS — N1831 Chronic kidney disease, stage 3a: Secondary | ICD-10-CM | POA: Diagnosis not present

## 2022-09-30 DIAGNOSIS — Z8546 Personal history of malignant neoplasm of prostate: Secondary | ICD-10-CM | POA: Diagnosis not present

## 2022-09-30 DIAGNOSIS — R627 Adult failure to thrive: Secondary | ICD-10-CM | POA: Diagnosis not present

## 2022-09-30 DIAGNOSIS — I509 Heart failure, unspecified: Secondary | ICD-10-CM | POA: Diagnosis not present

## 2022-10-04 ENCOUNTER — Ambulatory Visit (INDEPENDENT_AMBULATORY_CARE_PROVIDER_SITE_OTHER): Payer: Medicare Other | Admitting: Cardiovascular Disease

## 2022-10-04 ENCOUNTER — Telehealth: Payer: Self-pay

## 2022-10-04 DIAGNOSIS — Z952 Presence of prosthetic heart valve: Secondary | ICD-10-CM

## 2022-10-04 DIAGNOSIS — I480 Paroxysmal atrial fibrillation: Secondary | ICD-10-CM | POA: Diagnosis not present

## 2022-10-04 DIAGNOSIS — Z7901 Long term (current) use of anticoagulants: Secondary | ICD-10-CM

## 2022-10-04 LAB — POCT INR: INR: 3.5 — AB (ref 2.0–3.0)

## 2022-10-04 NOTE — Telephone Encounter (Signed)
Lm to check pt's INR.

## 2022-10-11 ENCOUNTER — Ambulatory Visit: Payer: Self-pay

## 2022-10-11 DIAGNOSIS — Z7901 Long term (current) use of anticoagulants: Secondary | ICD-10-CM | POA: Diagnosis not present

## 2022-10-11 DIAGNOSIS — Z952 Presence of prosthetic heart valve: Secondary | ICD-10-CM | POA: Diagnosis not present

## 2022-10-11 LAB — POCT INR: INR: 4 — AB (ref 2.0–3.0)

## 2022-10-18 ENCOUNTER — Ambulatory Visit (INDEPENDENT_AMBULATORY_CARE_PROVIDER_SITE_OTHER): Payer: Medicare Other | Admitting: Cardiology

## 2022-10-18 DIAGNOSIS — Z7901 Long term (current) use of anticoagulants: Secondary | ICD-10-CM | POA: Diagnosis not present

## 2022-10-18 DIAGNOSIS — Z952 Presence of prosthetic heart valve: Secondary | ICD-10-CM | POA: Diagnosis not present

## 2022-10-18 LAB — POCT INR: INR: 3.9 — AB (ref 2.0–3.0)

## 2022-10-19 ENCOUNTER — Other Ambulatory Visit: Payer: Self-pay | Admitting: Urology

## 2022-10-19 ENCOUNTER — Other Ambulatory Visit: Payer: Self-pay | Admitting: Cardiology

## 2022-10-19 DIAGNOSIS — R310 Gross hematuria: Secondary | ICD-10-CM

## 2022-10-19 NOTE — Telephone Encounter (Signed)
Rx request sent to pharmacy.  

## 2022-10-25 ENCOUNTER — Ambulatory Visit (INDEPENDENT_AMBULATORY_CARE_PROVIDER_SITE_OTHER): Payer: Medicare Other

## 2022-10-25 DIAGNOSIS — Z5181 Encounter for therapeutic drug level monitoring: Secondary | ICD-10-CM

## 2022-10-25 LAB — POCT INR: INR: 3.3 — AB (ref 2.0–3.0)

## 2022-10-25 NOTE — Patient Instructions (Signed)
Description   Spoke with pt's son, Gary, and instructed for pt to continue taking warfarin 1/2 tablet daily except for 1 tablet on Tuesday, Thursday, and Saturdays.  Recheck INR in 1 week. (self tester, checks weekly, checks later in the day).  [facility] [phone]     

## 2022-11-02 ENCOUNTER — Ambulatory Visit (INDEPENDENT_AMBULATORY_CARE_PROVIDER_SITE_OTHER): Payer: Medicare Other | Admitting: *Deleted

## 2022-11-02 DIAGNOSIS — Z7901 Long term (current) use of anticoagulants: Secondary | ICD-10-CM | POA: Diagnosis not present

## 2022-11-02 DIAGNOSIS — I480 Paroxysmal atrial fibrillation: Secondary | ICD-10-CM

## 2022-11-02 DIAGNOSIS — Z952 Presence of prosthetic heart valve: Secondary | ICD-10-CM

## 2022-11-02 LAB — POCT INR: INR: 4.1 — AB (ref 2.0–3.0)

## 2022-11-02 NOTE — Patient Instructions (Signed)
Description   Spoke with pt's son, Jillyn Hidden, and instructed for pt to only take 1/2 tablet today and then START taking warfarin 1/2 tablet daily except for 1 tablet on Tuesday and Thursday.   Recheck INR in 1 week. (self tester, checks weekly, checks later in the day).  [facility] [phone]

## 2022-11-05 DIAGNOSIS — Z23 Encounter for immunization: Secondary | ICD-10-CM | POA: Diagnosis not present

## 2022-11-08 ENCOUNTER — Ambulatory Visit (INDEPENDENT_AMBULATORY_CARE_PROVIDER_SITE_OTHER): Payer: Self-pay

## 2022-11-08 DIAGNOSIS — Z5181 Encounter for therapeutic drug level monitoring: Secondary | ICD-10-CM | POA: Diagnosis not present

## 2022-11-08 LAB — POCT INR: INR: 2.9 (ref 2.0–3.0)

## 2022-11-08 NOTE — Patient Instructions (Signed)
Description   Spoke with pt's son, Jillyn Hidden, and instructed for pt to continue taking warfarin 1/2 tablet daily except for 1 tablet on Tuesday and Thursday.   Recheck INR in 1 week. (self tester, checks weekly, checks later in the day).  [facility] [phone]

## 2022-11-15 ENCOUNTER — Ambulatory Visit (INDEPENDENT_AMBULATORY_CARE_PROVIDER_SITE_OTHER): Payer: Self-pay | Admitting: Cardiology

## 2022-11-15 ENCOUNTER — Telehealth: Payer: Self-pay

## 2022-11-15 DIAGNOSIS — Z7901 Long term (current) use of anticoagulants: Secondary | ICD-10-CM

## 2022-11-15 DIAGNOSIS — Z952 Presence of prosthetic heart valve: Secondary | ICD-10-CM

## 2022-11-15 LAB — POCT INR: INR: 2.9 (ref 2.0–3.0)

## 2022-11-15 NOTE — Telephone Encounter (Signed)
Lp's son message to check patient's INR today.

## 2022-11-22 ENCOUNTER — Telehealth: Payer: Self-pay | Admitting: Cardiology

## 2022-11-22 NOTE — Telephone Encounter (Signed)
Spoke with son and patient is declining but does not think cardiac related Does not want to see APP  Advised would get him added to cancellation list and call back if anything changes

## 2022-11-22 NOTE — Telephone Encounter (Signed)
Pt son called in to cancel pt's upcoming appt and had to r/s for next available in January and he declined see APP sooner. He asked if I could send a message to see Dr. Cristal Deer is okay with pt waiting until January. Please advise.

## 2022-11-22 NOTE — Telephone Encounter (Signed)
Error

## 2022-11-23 ENCOUNTER — Ambulatory Visit (HOSPITAL_BASED_OUTPATIENT_CLINIC_OR_DEPARTMENT_OTHER): Payer: Medicare Other | Admitting: Cardiology

## 2022-11-29 ENCOUNTER — Ambulatory Visit (INDEPENDENT_AMBULATORY_CARE_PROVIDER_SITE_OTHER): Payer: Medicare Other | Admitting: Internal Medicine

## 2022-11-29 ENCOUNTER — Telehealth: Payer: Self-pay

## 2022-11-29 DIAGNOSIS — Z7901 Long term (current) use of anticoagulants: Secondary | ICD-10-CM | POA: Diagnosis not present

## 2022-11-29 DIAGNOSIS — Z952 Presence of prosthetic heart valve: Secondary | ICD-10-CM

## 2022-11-29 LAB — POCT INR: INR: 3.3 — AB (ref 2.0–3.0)

## 2022-11-29 NOTE — Telephone Encounter (Signed)
Lp's son message tcb to discuss pt's INR

## 2022-12-06 ENCOUNTER — Ambulatory Visit (INDEPENDENT_AMBULATORY_CARE_PROVIDER_SITE_OTHER): Payer: Medicare Other

## 2022-12-06 DIAGNOSIS — Z952 Presence of prosthetic heart valve: Secondary | ICD-10-CM | POA: Diagnosis not present

## 2022-12-06 DIAGNOSIS — Z7901 Long term (current) use of anticoagulants: Secondary | ICD-10-CM | POA: Diagnosis not present

## 2022-12-06 DIAGNOSIS — Z5181 Encounter for therapeutic drug level monitoring: Secondary | ICD-10-CM

## 2022-12-06 DIAGNOSIS — I480 Paroxysmal atrial fibrillation: Secondary | ICD-10-CM | POA: Diagnosis not present

## 2022-12-06 LAB — POCT INR: INR: 3.3 — AB (ref 2.0–3.0)

## 2022-12-06 NOTE — Patient Instructions (Signed)
Description   Spoke with pt's son, Jillyn Hidden, and instructed for pt to continue taking warfarin 1/2 tablet daily except for 1 tablet on Tuesday and Thursday.   Recheck INR in 1 week. (self tester, checks weekly, checks later in the day).  [facility] [phone]

## 2022-12-13 ENCOUNTER — Ambulatory Visit (INDEPENDENT_AMBULATORY_CARE_PROVIDER_SITE_OTHER): Payer: Medicare Other | Admitting: Cardiology

## 2022-12-13 ENCOUNTER — Telehealth: Payer: Self-pay | Admitting: *Deleted

## 2022-12-13 DIAGNOSIS — Z7901 Long term (current) use of anticoagulants: Secondary | ICD-10-CM | POA: Diagnosis not present

## 2022-12-13 DIAGNOSIS — Z952 Presence of prosthetic heart valve: Secondary | ICD-10-CM | POA: Diagnosis not present

## 2022-12-13 LAB — POCT INR: INR: 3.4 — AB (ref 2.0–3.0)

## 2022-12-13 NOTE — Telephone Encounter (Signed)
Called pt since INR is due; there was no answer so left a message. Will await a call back and follow up.

## 2022-12-20 ENCOUNTER — Telehealth: Payer: Self-pay

## 2022-12-20 ENCOUNTER — Ambulatory Visit (INDEPENDENT_AMBULATORY_CARE_PROVIDER_SITE_OTHER): Payer: Medicare Other | Admitting: Cardiology

## 2022-12-20 DIAGNOSIS — Z952 Presence of prosthetic heart valve: Secondary | ICD-10-CM

## 2022-12-20 DIAGNOSIS — Z7901 Long term (current) use of anticoagulants: Secondary | ICD-10-CM | POA: Diagnosis not present

## 2022-12-20 LAB — POCT INR: INR: 3.2 — AB (ref 2.0–3.0)

## 2022-12-20 NOTE — Telephone Encounter (Signed)
Lp's son message to check INR. 

## 2022-12-27 ENCOUNTER — Telehealth: Payer: Self-pay | Admitting: *Deleted

## 2022-12-27 ENCOUNTER — Ambulatory Visit (INDEPENDENT_AMBULATORY_CARE_PROVIDER_SITE_OTHER): Payer: Self-pay | Admitting: Cardiovascular Disease

## 2022-12-27 DIAGNOSIS — Z7901 Long term (current) use of anticoagulants: Secondary | ICD-10-CM | POA: Diagnosis not present

## 2022-12-27 DIAGNOSIS — Z952 Presence of prosthetic heart valve: Secondary | ICD-10-CM | POA: Diagnosis not present

## 2022-12-27 LAB — POCT INR: INR: 2.9 (ref 2.0–3.0)

## 2022-12-27 NOTE — Telephone Encounter (Signed)
Called since pt's INR testing is due. There was no answer so left a message stating that INR is due for the patient and to call back once done. Will await and follow up.

## 2023-01-03 ENCOUNTER — Ambulatory Visit (INDEPENDENT_AMBULATORY_CARE_PROVIDER_SITE_OTHER): Payer: Medicare Other

## 2023-01-03 DIAGNOSIS — Z952 Presence of prosthetic heart valve: Secondary | ICD-10-CM | POA: Diagnosis not present

## 2023-01-03 DIAGNOSIS — Z5181 Encounter for therapeutic drug level monitoring: Secondary | ICD-10-CM | POA: Diagnosis not present

## 2023-01-03 DIAGNOSIS — I480 Paroxysmal atrial fibrillation: Secondary | ICD-10-CM | POA: Diagnosis not present

## 2023-01-03 DIAGNOSIS — Z7901 Long term (current) use of anticoagulants: Secondary | ICD-10-CM | POA: Diagnosis not present

## 2023-01-03 LAB — POCT INR: INR: 3.1 — AB (ref 2.0–3.0)

## 2023-01-03 NOTE — Patient Instructions (Signed)
Description   Spoke with pt's son, Jillyn Hidden, and instructed for pt to continue taking warfarin 1/2 tablet daily except for 1 tablet on Tuesday and Thursday.   Recheck INR in 1 week. (self tester, checks weekly, checks later in the day).  [facility] [phone]

## 2023-01-07 DIAGNOSIS — J069 Acute upper respiratory infection, unspecified: Secondary | ICD-10-CM | POA: Diagnosis not present

## 2023-01-10 ENCOUNTER — Ambulatory Visit (INDEPENDENT_AMBULATORY_CARE_PROVIDER_SITE_OTHER): Payer: Self-pay | Admitting: *Deleted

## 2023-01-10 DIAGNOSIS — Z952 Presence of prosthetic heart valve: Secondary | ICD-10-CM | POA: Diagnosis not present

## 2023-01-10 DIAGNOSIS — I480 Paroxysmal atrial fibrillation: Secondary | ICD-10-CM | POA: Diagnosis not present

## 2023-01-10 DIAGNOSIS — Z7901 Long term (current) use of anticoagulants: Secondary | ICD-10-CM | POA: Diagnosis not present

## 2023-01-10 LAB — POCT INR: INR: 2.6 (ref 2.0–3.0)

## 2023-01-10 NOTE — Patient Instructions (Addendum)
Description   Spoke with pt's son, Tanner Ferguson, and instructed for pt to continue taking warfarin 1/2 tablet daily except for 1 tablet on Tuesday and Thursday.   Recheck INR on Thursday (normal 1 week)-started doxy 100mg  bid x 10 days on 01/07/23. (self tester, checks weekly, checks later in the day).  [facility] [phone]

## 2023-01-13 ENCOUNTER — Ambulatory Visit (INDEPENDENT_AMBULATORY_CARE_PROVIDER_SITE_OTHER): Payer: Medicare Other

## 2023-01-13 DIAGNOSIS — I480 Paroxysmal atrial fibrillation: Secondary | ICD-10-CM | POA: Diagnosis not present

## 2023-01-13 DIAGNOSIS — Z952 Presence of prosthetic heart valve: Secondary | ICD-10-CM

## 2023-01-13 DIAGNOSIS — Z7901 Long term (current) use of anticoagulants: Secondary | ICD-10-CM

## 2023-01-13 LAB — POCT INR: INR: 2.5 (ref 2.0–3.0)

## 2023-01-13 NOTE — Patient Instructions (Signed)
Description   Continue taking warfarin 1/2 tablet daily except for 1 tablet on Tuesday and Thursday.   Recheck INR in 1 week, 01/24/23 (self tester, checks weekly, checks later in the day).  [facility] [phone]

## 2023-01-17 ENCOUNTER — Ambulatory Visit (INDEPENDENT_AMBULATORY_CARE_PROVIDER_SITE_OTHER): Payer: Self-pay

## 2023-01-17 DIAGNOSIS — Z5181 Encounter for therapeutic drug level monitoring: Secondary | ICD-10-CM

## 2023-01-17 LAB — POCT INR: INR: 3 (ref 2.0–3.0)

## 2023-01-17 NOTE — Patient Instructions (Signed)
Description   Continue taking warfarin 1/2 tablet daily except for 1 tablet on Tuesday and Thursday.   Recheck INR in 1 week.  (self tester, checks weekly, checks later in the day.  [facility] [phone]

## 2023-01-19 ENCOUNTER — Other Ambulatory Visit: Payer: Self-pay | Admitting: Urology

## 2023-01-19 DIAGNOSIS — R310 Gross hematuria: Secondary | ICD-10-CM

## 2023-01-24 ENCOUNTER — Telehealth: Payer: Self-pay | Admitting: Urology

## 2023-01-24 ENCOUNTER — Other Ambulatory Visit: Payer: Self-pay | Admitting: Urology

## 2023-01-24 ENCOUNTER — Ambulatory Visit (INDEPENDENT_AMBULATORY_CARE_PROVIDER_SITE_OTHER): Payer: Medicare Other | Admitting: Cardiology

## 2023-01-24 DIAGNOSIS — Z952 Presence of prosthetic heart valve: Secondary | ICD-10-CM

## 2023-01-24 DIAGNOSIS — Z7901 Long term (current) use of anticoagulants: Secondary | ICD-10-CM | POA: Diagnosis not present

## 2023-01-24 DIAGNOSIS — R310 Gross hematuria: Secondary | ICD-10-CM

## 2023-01-24 LAB — POCT INR: INR: 3.6 — AB (ref 2.0–3.0)

## 2023-01-24 MED ORDER — FINASTERIDE 5 MG PO TABS: 5.0000 mg | ORAL_TABLET | Freq: Every day | ORAL | 3 refills | Status: DC

## 2023-01-24 NOTE — Telephone Encounter (Signed)
Patient has two days left of medication and needs a refill.  finasteride (PROSCAR) 5 MG tablet

## 2023-01-31 ENCOUNTER — Ambulatory Visit (INDEPENDENT_AMBULATORY_CARE_PROVIDER_SITE_OTHER): Payer: Medicare Other | Admitting: Cardiology

## 2023-01-31 DIAGNOSIS — I480 Paroxysmal atrial fibrillation: Secondary | ICD-10-CM | POA: Diagnosis not present

## 2023-01-31 DIAGNOSIS — Z952 Presence of prosthetic heart valve: Secondary | ICD-10-CM

## 2023-01-31 DIAGNOSIS — Z7901 Long term (current) use of anticoagulants: Secondary | ICD-10-CM | POA: Diagnosis not present

## 2023-01-31 LAB — POCT INR: INR: 2.6 (ref 2.0–3.0)

## 2023-02-07 ENCOUNTER — Ambulatory Visit (INDEPENDENT_AMBULATORY_CARE_PROVIDER_SITE_OTHER): Payer: Medicare Other | Admitting: *Deleted

## 2023-02-07 ENCOUNTER — Telehealth: Payer: Self-pay

## 2023-02-07 DIAGNOSIS — Z7901 Long term (current) use of anticoagulants: Secondary | ICD-10-CM

## 2023-02-07 DIAGNOSIS — Z952 Presence of prosthetic heart valve: Secondary | ICD-10-CM

## 2023-02-07 DIAGNOSIS — I480 Paroxysmal atrial fibrillation: Secondary | ICD-10-CM

## 2023-02-07 LAB — POCT INR: INR: 2.7 (ref 2.0–3.0)

## 2023-02-07 NOTE — Telephone Encounter (Signed)
Reminded pt's son to check INR.

## 2023-02-07 NOTE — Patient Instructions (Signed)
Description   Spoke with pt's son and advised to continue taking warfarin 1/2 tablet daily except for 1 tablet on Tuesday and Thursday.   Recheck INR in 1 week.  (self tester, checks weekly, checks later in the day.  [facility] [phone]

## 2023-02-14 ENCOUNTER — Ambulatory Visit (INDEPENDENT_AMBULATORY_CARE_PROVIDER_SITE_OTHER): Payer: Self-pay

## 2023-02-14 DIAGNOSIS — Z5181 Encounter for therapeutic drug level monitoring: Secondary | ICD-10-CM | POA: Diagnosis not present

## 2023-02-14 LAB — POCT INR: INR: 2.3 (ref 2.0–3.0)

## 2023-02-14 NOTE — Patient Instructions (Signed)
Description   Spoke with pt's son and advised to take 1 tablet today and then continue taking warfarin 1/2 tablet daily except for 1 tablet on Tuesday and Thursday.   Recheck INR in 1 week.  (self tester, checks weekly, checks later in the day.  [facility] [phone]

## 2023-02-21 ENCOUNTER — Ambulatory Visit (INDEPENDENT_AMBULATORY_CARE_PROVIDER_SITE_OTHER): Payer: Medicare Other | Admitting: *Deleted

## 2023-02-21 DIAGNOSIS — I480 Paroxysmal atrial fibrillation: Secondary | ICD-10-CM

## 2023-02-21 DIAGNOSIS — Z7901 Long term (current) use of anticoagulants: Secondary | ICD-10-CM

## 2023-02-21 DIAGNOSIS — Z952 Presence of prosthetic heart valve: Secondary | ICD-10-CM | POA: Diagnosis not present

## 2023-02-21 LAB — POCT INR: INR: 2.4 (ref 2.0–3.0)

## 2023-02-21 NOTE — Patient Instructions (Signed)
Description   Spoke with pt's son and advised to take 1 tablet today (advised if low next time will need to change dose) and then continue taking warfarin 1/2 tablet daily except for 1 tablet on Tuesday and Thursday.   Recheck INR in 1 week.  (self tester, checks weekly, checks later in the day.  [facility] [phone]

## 2023-02-28 ENCOUNTER — Ambulatory Visit (INDEPENDENT_AMBULATORY_CARE_PROVIDER_SITE_OTHER): Payer: Self-pay | Admitting: Internal Medicine

## 2023-02-28 DIAGNOSIS — Z7901 Long term (current) use of anticoagulants: Secondary | ICD-10-CM | POA: Diagnosis not present

## 2023-02-28 DIAGNOSIS — Z952 Presence of prosthetic heart valve: Secondary | ICD-10-CM

## 2023-02-28 DIAGNOSIS — I480 Paroxysmal atrial fibrillation: Secondary | ICD-10-CM | POA: Diagnosis not present

## 2023-02-28 LAB — POCT INR: INR: 2.6 (ref 2.0–3.0)

## 2023-03-07 ENCOUNTER — Ambulatory Visit (INDEPENDENT_AMBULATORY_CARE_PROVIDER_SITE_OTHER): Payer: Self-pay | Admitting: Cardiology

## 2023-03-07 DIAGNOSIS — Z952 Presence of prosthetic heart valve: Secondary | ICD-10-CM

## 2023-03-07 DIAGNOSIS — Z7901 Long term (current) use of anticoagulants: Secondary | ICD-10-CM | POA: Diagnosis not present

## 2023-03-07 LAB — POCT INR: INR: 2.5 (ref 2.0–3.0)

## 2023-03-08 ENCOUNTER — Encounter (HOSPITAL_BASED_OUTPATIENT_CLINIC_OR_DEPARTMENT_OTHER): Payer: Self-pay

## 2023-03-10 ENCOUNTER — Ambulatory Visit (HOSPITAL_BASED_OUTPATIENT_CLINIC_OR_DEPARTMENT_OTHER): Payer: Medicare Other | Admitting: Cardiology

## 2023-03-14 ENCOUNTER — Ambulatory Visit (INDEPENDENT_AMBULATORY_CARE_PROVIDER_SITE_OTHER): Payer: Self-pay | Admitting: Cardiology

## 2023-03-14 DIAGNOSIS — Z952 Presence of prosthetic heart valve: Secondary | ICD-10-CM

## 2023-03-14 DIAGNOSIS — Z7901 Long term (current) use of anticoagulants: Secondary | ICD-10-CM | POA: Diagnosis not present

## 2023-03-14 LAB — POCT INR: INR: 3.4 — AB (ref 2.0–3.0)

## 2023-03-21 ENCOUNTER — Ambulatory Visit (INDEPENDENT_AMBULATORY_CARE_PROVIDER_SITE_OTHER): Payer: Self-pay | Admitting: Cardiovascular Disease

## 2023-03-21 DIAGNOSIS — Z7901 Long term (current) use of anticoagulants: Secondary | ICD-10-CM

## 2023-03-21 DIAGNOSIS — Z952 Presence of prosthetic heart valve: Secondary | ICD-10-CM

## 2023-03-21 LAB — POCT INR: INR: 4.1 — AB (ref 2.0–3.0)

## 2023-03-28 ENCOUNTER — Ambulatory Visit (INDEPENDENT_AMBULATORY_CARE_PROVIDER_SITE_OTHER): Payer: Medicare Other | Admitting: Cardiology

## 2023-03-28 DIAGNOSIS — Z7901 Long term (current) use of anticoagulants: Secondary | ICD-10-CM

## 2023-03-28 DIAGNOSIS — Z952 Presence of prosthetic heart valve: Secondary | ICD-10-CM

## 2023-03-28 DIAGNOSIS — I480 Paroxysmal atrial fibrillation: Secondary | ICD-10-CM | POA: Diagnosis not present

## 2023-03-28 LAB — POCT INR: INR: 3.8 — AB (ref 2.0–3.0)

## 2023-04-04 ENCOUNTER — Ambulatory Visit (INDEPENDENT_AMBULATORY_CARE_PROVIDER_SITE_OTHER): Payer: Self-pay | Admitting: Cardiovascular Disease

## 2023-04-04 DIAGNOSIS — Z7901 Long term (current) use of anticoagulants: Secondary | ICD-10-CM

## 2023-04-04 DIAGNOSIS — Z952 Presence of prosthetic heart valve: Secondary | ICD-10-CM | POA: Diagnosis not present

## 2023-04-04 LAB — POCT INR: INR: 3.6 — AB (ref 2.0–3.0)

## 2023-04-05 DIAGNOSIS — J849 Interstitial pulmonary disease, unspecified: Secondary | ICD-10-CM | POA: Diagnosis not present

## 2023-04-05 DIAGNOSIS — F03911 Unspecified dementia, unspecified severity, with agitation: Secondary | ICD-10-CM | POA: Diagnosis not present

## 2023-04-05 DIAGNOSIS — Z952 Presence of prosthetic heart valve: Secondary | ICD-10-CM | POA: Diagnosis not present

## 2023-04-05 DIAGNOSIS — I4891 Unspecified atrial fibrillation: Secondary | ICD-10-CM | POA: Diagnosis not present

## 2023-04-05 DIAGNOSIS — R627 Adult failure to thrive: Secondary | ICD-10-CM | POA: Diagnosis not present

## 2023-04-05 DIAGNOSIS — R4181 Age-related cognitive decline: Secondary | ICD-10-CM | POA: Diagnosis not present

## 2023-04-05 DIAGNOSIS — R269 Unspecified abnormalities of gait and mobility: Secondary | ICD-10-CM | POA: Diagnosis not present

## 2023-04-05 DIAGNOSIS — R54 Age-related physical debility: Secondary | ICD-10-CM | POA: Diagnosis not present

## 2023-04-05 DIAGNOSIS — F0390 Unspecified dementia without behavioral disturbance: Secondary | ICD-10-CM | POA: Diagnosis not present

## 2023-04-05 DIAGNOSIS — I509 Heart failure, unspecified: Secondary | ICD-10-CM | POA: Diagnosis not present

## 2023-04-11 ENCOUNTER — Ambulatory Visit (INDEPENDENT_AMBULATORY_CARE_PROVIDER_SITE_OTHER): Payer: Self-pay

## 2023-04-11 DIAGNOSIS — Z5181 Encounter for therapeutic drug level monitoring: Secondary | ICD-10-CM

## 2023-04-11 LAB — POCT INR: INR: 3.9 — AB (ref 2.0–3.0)

## 2023-04-11 NOTE — Patient Instructions (Signed)
 Description   Spoke with pt's son and advised to HOLD today's dose and then START taking warfarin 1/2 tablet daily except for 1 tablet on Tuesdays. Recheck INR in 1 week.  (self tester, checks weekly, checks later in the day)  [facility] [phone]

## 2023-04-18 ENCOUNTER — Ambulatory Visit (INDEPENDENT_AMBULATORY_CARE_PROVIDER_SITE_OTHER): Payer: Self-pay | Admitting: Cardiovascular Disease

## 2023-04-18 DIAGNOSIS — Z7901 Long term (current) use of anticoagulants: Secondary | ICD-10-CM

## 2023-04-18 DIAGNOSIS — Z952 Presence of prosthetic heart valve: Secondary | ICD-10-CM | POA: Diagnosis not present

## 2023-04-18 LAB — POCT INR: INR: 5 — AB (ref 2.0–3.0)

## 2023-04-25 ENCOUNTER — Ambulatory Visit (INDEPENDENT_AMBULATORY_CARE_PROVIDER_SITE_OTHER): Payer: Self-pay

## 2023-04-25 DIAGNOSIS — Z5181 Encounter for therapeutic drug level monitoring: Secondary | ICD-10-CM | POA: Diagnosis not present

## 2023-04-25 DIAGNOSIS — I480 Paroxysmal atrial fibrillation: Secondary | ICD-10-CM | POA: Diagnosis not present

## 2023-04-25 DIAGNOSIS — Z952 Presence of prosthetic heart valve: Secondary | ICD-10-CM | POA: Diagnosis not present

## 2023-04-25 DIAGNOSIS — Z7901 Long term (current) use of anticoagulants: Secondary | ICD-10-CM | POA: Diagnosis not present

## 2023-04-25 LAB — POCT INR: INR: 3.3 — AB (ref 2.0–3.0)

## 2023-04-25 NOTE — Patient Instructions (Signed)
 Description   Spoke with pt's son and advised to continue taking warfarin 1/2 tablet daily except for 1 tablet on Tuesdays.  Recheck INR in 1 week.  (self tester, checks weekly, checks later in the day)  [facility] [phone]

## 2023-05-02 ENCOUNTER — Ambulatory Visit (INDEPENDENT_AMBULATORY_CARE_PROVIDER_SITE_OTHER): Admitting: Cardiology

## 2023-05-02 DIAGNOSIS — Z7901 Long term (current) use of anticoagulants: Secondary | ICD-10-CM

## 2023-05-02 DIAGNOSIS — Z952 Presence of prosthetic heart valve: Secondary | ICD-10-CM | POA: Diagnosis not present

## 2023-05-02 LAB — POCT INR: INR: 3 (ref 2.0–3.0)

## 2023-05-09 ENCOUNTER — Ambulatory Visit (INDEPENDENT_AMBULATORY_CARE_PROVIDER_SITE_OTHER): Payer: Self-pay | Admitting: Internal Medicine

## 2023-05-09 DIAGNOSIS — Z952 Presence of prosthetic heart valve: Secondary | ICD-10-CM | POA: Diagnosis not present

## 2023-05-09 DIAGNOSIS — Z7901 Long term (current) use of anticoagulants: Secondary | ICD-10-CM | POA: Diagnosis not present

## 2023-05-09 LAB — POCT INR: INR: 3.1 — AB (ref 2.0–3.0)

## 2023-05-16 ENCOUNTER — Ambulatory Visit (INDEPENDENT_AMBULATORY_CARE_PROVIDER_SITE_OTHER): Payer: Self-pay | Admitting: *Deleted

## 2023-05-16 DIAGNOSIS — Z7901 Long term (current) use of anticoagulants: Secondary | ICD-10-CM | POA: Diagnosis not present

## 2023-05-16 DIAGNOSIS — Z952 Presence of prosthetic heart valve: Secondary | ICD-10-CM

## 2023-05-16 DIAGNOSIS — I480 Paroxysmal atrial fibrillation: Secondary | ICD-10-CM

## 2023-05-16 LAB — POCT INR: INR: 3.8 — AB (ref 2.0–3.0)

## 2023-05-16 NOTE — Patient Instructions (Signed)
 Description   Spoke with pt's son and advised to not take any warfarin today then continue taking warfarin 1/2 tablet daily except for 1 tablet on Tuesdays.  Recheck INR in 1 week.  (self tester, checks weekly, checks later in the day)  [facility] [phone]

## 2023-05-23 ENCOUNTER — Ambulatory Visit (INDEPENDENT_AMBULATORY_CARE_PROVIDER_SITE_OTHER): Payer: Self-pay | Admitting: Cardiology

## 2023-05-23 DIAGNOSIS — I480 Paroxysmal atrial fibrillation: Secondary | ICD-10-CM | POA: Diagnosis not present

## 2023-05-23 DIAGNOSIS — Z7901 Long term (current) use of anticoagulants: Secondary | ICD-10-CM | POA: Diagnosis not present

## 2023-05-23 DIAGNOSIS — Z5181 Encounter for therapeutic drug level monitoring: Secondary | ICD-10-CM

## 2023-05-23 DIAGNOSIS — Z952 Presence of prosthetic heart valve: Secondary | ICD-10-CM | POA: Diagnosis not present

## 2023-05-23 LAB — POCT INR: INR: 3.5 — AB (ref 2.0–3.0)

## 2023-05-23 NOTE — Patient Instructions (Signed)
 Description   Spoke with pt's son and advised to continue taking warfarin 1/2 tablet daily except for 1 tablet on Tuesdays.  Recheck INR in 1 week.  (self tester, checks weekly, checks later in the day)  [facility] [phone]

## 2023-05-24 ENCOUNTER — Inpatient Hospital Stay (HOSPITAL_COMMUNITY)
Admission: EM | Admit: 2023-05-24 | Discharge: 2023-05-31 | DRG: 699 | Disposition: E | Attending: Internal Medicine | Admitting: Internal Medicine

## 2023-05-24 ENCOUNTER — Telehealth: Payer: Self-pay | Admitting: Urology

## 2023-05-24 ENCOUNTER — Inpatient Hospital Stay (HOSPITAL_COMMUNITY)

## 2023-05-24 DIAGNOSIS — R4589 Other symptoms and signs involving emotional state: Secondary | ICD-10-CM

## 2023-05-24 DIAGNOSIS — R310 Gross hematuria: Secondary | ICD-10-CM

## 2023-05-24 DIAGNOSIS — I2510 Atherosclerotic heart disease of native coronary artery without angina pectoris: Secondary | ICD-10-CM | POA: Diagnosis present

## 2023-05-24 DIAGNOSIS — I129 Hypertensive chronic kidney disease with stage 1 through stage 4 chronic kidney disease, or unspecified chronic kidney disease: Secondary | ICD-10-CM | POA: Diagnosis present

## 2023-05-24 DIAGNOSIS — Z66 Do not resuscitate: Secondary | ICD-10-CM

## 2023-05-24 DIAGNOSIS — N1832 Chronic kidney disease, stage 3b: Secondary | ICD-10-CM | POA: Diagnosis present

## 2023-05-24 DIAGNOSIS — I480 Paroxysmal atrial fibrillation: Secondary | ICD-10-CM | POA: Diagnosis present

## 2023-05-24 DIAGNOSIS — I4891 Unspecified atrial fibrillation: Secondary | ICD-10-CM | POA: Diagnosis not present

## 2023-05-24 DIAGNOSIS — E785 Hyperlipidemia, unspecified: Secondary | ICD-10-CM | POA: Diagnosis not present

## 2023-05-24 DIAGNOSIS — E876 Hypokalemia: Secondary | ICD-10-CM | POA: Insufficient documentation

## 2023-05-24 DIAGNOSIS — N3041 Irradiation cystitis with hematuria: Principal | ICD-10-CM | POA: Diagnosis present

## 2023-05-24 DIAGNOSIS — Z87891 Personal history of nicotine dependence: Secondary | ICD-10-CM

## 2023-05-24 DIAGNOSIS — Z79899 Other long term (current) drug therapy: Secondary | ICD-10-CM

## 2023-05-24 DIAGNOSIS — Z880 Allergy status to penicillin: Secondary | ICD-10-CM | POA: Diagnosis not present

## 2023-05-24 DIAGNOSIS — D61810 Antineoplastic chemotherapy induced pancytopenia: Secondary | ICD-10-CM | POA: Diagnosis not present

## 2023-05-24 DIAGNOSIS — R001 Bradycardia, unspecified: Secondary | ICD-10-CM | POA: Diagnosis present

## 2023-05-24 DIAGNOSIS — N320 Bladder-neck obstruction: Secondary | ICD-10-CM

## 2023-05-24 DIAGNOSIS — R627 Adult failure to thrive: Secondary | ICD-10-CM | POA: Diagnosis not present

## 2023-05-24 DIAGNOSIS — D61818 Other pancytopenia: Principal | ICD-10-CM

## 2023-05-24 DIAGNOSIS — I252 Old myocardial infarction: Secondary | ICD-10-CM

## 2023-05-24 DIAGNOSIS — N138 Other obstructive and reflux uropathy: Secondary | ICD-10-CM | POA: Diagnosis not present

## 2023-05-24 DIAGNOSIS — Z7982 Long term (current) use of aspirin: Secondary | ICD-10-CM

## 2023-05-24 DIAGNOSIS — G250 Essential tremor: Secondary | ICD-10-CM | POA: Diagnosis present

## 2023-05-24 DIAGNOSIS — Y842 Radiological procedure and radiotherapy as the cause of abnormal reaction of the patient, or of later complication, without mention of misadventure at the time of the procedure: Secondary | ICD-10-CM | POA: Diagnosis present

## 2023-05-24 DIAGNOSIS — Z7901 Long term (current) use of anticoagulants: Secondary | ICD-10-CM | POA: Diagnosis not present

## 2023-05-24 DIAGNOSIS — F03C4 Unspecified dementia, severe, with anxiety: Secondary | ICD-10-CM | POA: Diagnosis present

## 2023-05-24 DIAGNOSIS — Z923 Personal history of irradiation: Secondary | ICD-10-CM

## 2023-05-24 DIAGNOSIS — R0603 Acute respiratory distress: Secondary | ICD-10-CM | POA: Diagnosis not present

## 2023-05-24 DIAGNOSIS — Z515 Encounter for palliative care: Secondary | ICD-10-CM

## 2023-05-24 DIAGNOSIS — F03C18 Unspecified dementia, severe, with other behavioral disturbance: Secondary | ICD-10-CM | POA: Diagnosis present

## 2023-05-24 DIAGNOSIS — Z7951 Long term (current) use of inhaled steroids: Secondary | ICD-10-CM | POA: Diagnosis not present

## 2023-05-24 DIAGNOSIS — F03C11 Unspecified dementia, severe, with agitation: Secondary | ICD-10-CM | POA: Diagnosis present

## 2023-05-24 DIAGNOSIS — Z8249 Family history of ischemic heart disease and other diseases of the circulatory system: Secondary | ICD-10-CM | POA: Diagnosis not present

## 2023-05-24 DIAGNOSIS — J45909 Unspecified asthma, uncomplicated: Secondary | ICD-10-CM | POA: Diagnosis present

## 2023-05-24 DIAGNOSIS — Z8546 Personal history of malignant neoplasm of prostate: Secondary | ICD-10-CM | POA: Diagnosis not present

## 2023-05-24 DIAGNOSIS — I340 Nonrheumatic mitral (valve) insufficiency: Secondary | ICD-10-CM | POA: Diagnosis present

## 2023-05-24 DIAGNOSIS — F03918 Unspecified dementia, unspecified severity, with other behavioral disturbance: Secondary | ICD-10-CM | POA: Diagnosis present

## 2023-05-24 DIAGNOSIS — R338 Other retention of urine: Secondary | ICD-10-CM | POA: Diagnosis present

## 2023-05-24 DIAGNOSIS — R0602 Shortness of breath: Secondary | ICD-10-CM

## 2023-05-24 DIAGNOSIS — N3289 Other specified disorders of bladder: Secondary | ICD-10-CM | POA: Diagnosis not present

## 2023-05-24 DIAGNOSIS — Z7189 Other specified counseling: Secondary | ICD-10-CM | POA: Diagnosis not present

## 2023-05-24 DIAGNOSIS — I10 Essential (primary) hypertension: Secondary | ICD-10-CM | POA: Diagnosis present

## 2023-05-24 DIAGNOSIS — Z952 Presence of prosthetic heart valve: Secondary | ICD-10-CM

## 2023-05-24 LAB — URINALYSIS, ROUTINE W REFLEX MICROSCOPIC
Bilirubin Urine: NEGATIVE
Glucose, UA: NEGATIVE mg/dL
Ketones, ur: NEGATIVE mg/dL
Leukocytes,Ua: NEGATIVE
Nitrite: NEGATIVE
Protein, ur: 100 mg/dL — AB
RBC / HPF: >50 RBC/hpf (ref 0–5)
Specific Gravity, Urine: 1.009 (ref 1.005–1.030)
WBC, UA: >50 WBC/hpf (ref 0–5)
pH: 7 (ref 5.0–8.0)

## 2023-05-24 LAB — CBC
HCT: 36.2 % — ABNORMAL LOW (ref 39.0–52.0)
Hemoglobin: 11.4 g/dL — ABNORMAL LOW (ref 13.0–17.0)
MCH: 29.2 pg (ref 26.0–34.0)
MCHC: 31.5 g/dL (ref 30.0–36.0)
MCV: 92.8 fL (ref 80.0–100.0)
Platelets: 55 10*3/uL — ABNORMAL LOW (ref 150–400)
RBC: 3.9 MIL/uL — ABNORMAL LOW (ref 4.22–5.81)
RDW: 15.9 % — ABNORMAL HIGH (ref 11.5–15.5)
WBC: 1.9 10*3/uL — ABNORMAL LOW (ref 4.0–10.5)
nRBC: 0 % (ref 0.0–0.2)

## 2023-05-24 LAB — BASIC METABOLIC PANEL
Anion gap: 9 (ref 5–15)
BUN: 24 mg/dL — ABNORMAL HIGH (ref 8–23)
CO2: 27 mmol/L (ref 22–32)
Calcium: 8.9 mg/dL (ref 8.9–10.3)
Chloride: 103 mmol/L (ref 98–111)
Creatinine, Ser: 1.88 mg/dL — ABNORMAL HIGH (ref 0.61–1.24)
GFR, Estimated: 33 mL/min — ABNORMAL LOW (ref >60)
Glucose, Bld: 92 mg/dL (ref 70–99)
Potassium: 2.8 mmol/L — ABNORMAL LOW (ref 3.5–5.1)
Sodium: 139 mmol/L (ref 135–145)

## 2023-05-24 LAB — PROTIME-INR
INR: 3.3 — ABNORMAL HIGH (ref 0.8–1.2)
Prothrombin Time: 33.4 s — ABNORMAL HIGH (ref 11.4–15.2)

## 2023-05-24 MED ORDER — LORAZEPAM 2 MG/ML IJ SOLN
1.0000 mg | Freq: Four times a day (QID) | INTRAMUSCULAR | Status: DC | PRN
  Administered 2023-05-24: 1 mg via INTRAVENOUS
  Filled 2023-05-24: qty 1

## 2023-05-24 MED ORDER — WARFARIN 1.25 MG HALF TABLET
1.2500 mg | ORAL_TABLET | Freq: Once | ORAL | Status: DC
  Filled 2023-05-24: qty 1

## 2023-05-24 MED ORDER — HYDRALAZINE HCL 20 MG/ML IJ SOLN: 10.0000 mg | INTRAMUSCULAR | Status: DC | PRN

## 2023-05-24 MED ORDER — SODIUM CHLORIDE 0.9 % IR SOLN
3000.0000 mL | Status: DC
  Administered 2023-05-24: 3000 mL

## 2023-05-24 MED ORDER — HALOPERIDOL LACTATE 5 MG/ML IJ SOLN
2.0000 mg | Freq: Four times a day (QID) | INTRAMUSCULAR | Status: DC | PRN
  Administered 2023-05-25: 2 mg via INTRAVENOUS
  Filled 2023-05-24: qty 1

## 2023-05-24 MED ORDER — WARFARIN SODIUM 2.5 MG PO TABS
2.5000 mg | ORAL_TABLET | Freq: Once | ORAL | Status: AC
  Administered 2023-05-24: 2.5 mg via ORAL
  Filled 2023-05-24: qty 1

## 2023-05-24 MED ORDER — SODIUM CHLORIDE 0.9 % IV SOLN
1.0000 g | Freq: Once | INTRAVENOUS | Status: AC
  Administered 2023-05-24: 1 g via INTRAVENOUS
  Filled 2023-05-24: qty 10

## 2023-05-24 MED ORDER — WARFARIN - PHARMACIST DOSING INPATIENT: Freq: Every day | Status: DC

## 2023-05-24 MED ORDER — POTASSIUM CHLORIDE 20 MEQ PO PACK
40.0000 meq | PACK | ORAL | Status: AC
Start: 2023-05-24 — End: 2023-05-24
  Administered 2023-05-24 (×2): 40 meq via ORAL
  Filled 2023-05-24 (×2): qty 2

## 2023-05-24 NOTE — Assessment & Plan Note (Signed)
-   patient has history of CKD3b. Baseline creat ~ 1.6 - 1.7, eGFR~ 38

## 2023-05-24 NOTE — Assessment & Plan Note (Signed)
-   Seen by Dr. Pamelia Hoit in Sept 2020 for similar; at that time was felt to be multifactorial in setting of infection, renal function -Discussed bedside with family regarding further workup; given his advanced dementia and overall decline, they are okay with deferring further workup at this time especially as they would not pursue treatment depending on diagnosis - PLTC acutely lower than prior baseline, possible some acute bone marrow suppression from current acute illness - no further workup in setting of comfort care

## 2023-05-24 NOTE — H&P (Signed)
 History and Physical    Tanner Ferguson  ZOX:[id number]  DOB: 10-Nov-1927  DOA: 05/24/2023  PCP: Tanner Housekeeper, MD Patient coming from: Home   Chief Complaint: urinary retention  HPI:  Tanner Ferguson is a 89 yo male with advanced dementia, PAF, AS s/p mechanical AV 1995, mitral regurg, OSA, CAD, HTN, HLD, essential tremor, prostate cancer s/p brachytherapy.  He presented with urinary retention and required Foley catheter placement in the ER yielding gross hematuria.  Family also reports that he has had noticeably decreased appetite over the past 1 month with associated weight loss as well as increasing agitation and difficulty managing the patient at home.  They feel that his dementia has worsened and are unsure if able to continue caring for him at home.  He had a similar episode of urinary retention with hematuria in March 2024.  This was treated with Foley catheter placement and antibiotic course along with Flomax and finasteride.  INR 3.3 on admission. After Foley catheter placement in the ER, he was initiated on CBI and urology was consulted.  Palliative care also consulted for assistance with GOC discussions.  Family interested in [facility] [place] and ideally seeking placement in residential hospice if possible rather than bringing patient home.  I have personally briefly reviewed patient's old medical [facility] in [facility] and discussed patient with the ER provider when appropriate/indicated.  Assessment and Plan: * Acute urinary retention - Similar episode March 2024 resolving with Foley placement and course of antibiotics - Has been compliant on finasteride at home - Follow-up urinalysis -Continue Foley and CBI; urology consulted in the ER  Atrial fibrillation (HCC) - afib with slow VR; currently denies CP or palpitations but some possible limitations with dementia -Continue amiodarone and Coumadin  Dementia with behavioral disturbance (HCC) - Family reports he  has become more agitated recently and having more difficulty caring for him at home; feel that previous trial of Depakote made him worse as well as interacted with Coumadin -They also endorse that he has progressively lost weight over the past month and has been eating significantly less - ideally they are in favor of residential hospice; ideal is [facility] [place] - palliative care consulted - suspect he may have some sundowning while here; will order haldol and ativan for PRN use  Pancytopenia (HCC) - Seen by Dr. Pamelia Hoit in Sept 2020 for similar; at that time was felt to be multifactorial in setting of infection, renal function -Discussed bedside with family regarding further workup; given his advanced dementia and overall decline, they are okay with deferring further workup at this time especially as they would not pursue treatment depending on diagnosis - PLTC acutely lower than prior baseline, possible some acute bone marrow suppression from current acute illness and theoretically should recover some   Stage 3b chronic kidney disease (CKD) (HCC) - patient has history of CKD3b. Baseline creat ~ 1.6 - 1.7, eGFR~ 38   Hypokalemia - replete  History of prostate cancer - s/p brachytherapy over 10 years ago  Bradycardia - Asymptomatic and known; also see A-fib slow VR  Hypertension - Slightly above goal possibly in setting of pain on admission - Hydralazine as needed  Essential tremor - Was referred to neurology previously for consideration of DBS - Given advanced dementia and progressive decline, supportive care for now.  Not a candidate for beta-blocker trial with bradycardia  Hyperlipidemia - No further mortality benefit with statin use  History of mechanical aortic valve replacement - 1995 -  Continue Coumadin per pharmacy    Code Status:     Code Status: Limited: Do not attempt resuscitation (DNR) -DNR-LIMITED -Do Not Intubate/DNI   DVT Prophylaxis: Coumadin       Anticipated disposition is to: TBD  History: Past Medical History:  Diagnosis Date   Asthma    CAD (coronary artery disease)    s/p NSTEMI 9/19. Cath in [place], [place] 10/19. Severe CAD not amenable to PCI. Not CABG candidate   CHF (congestive heart failure) (HCC)    EF 40-45% by echo due to iCM. EF 36% by Myoview 9/19   Dementia (HCC)    Essential tremor    Hyperlipidemia    Intolerant of statins   Hypertension    PAF (paroxysmal atrial fibrillation) (HCC)    Pancytopenia (HCC)    Follows with hematology   Prostate cancer (HCC)    Seizure (HCC)     Past Surgical History:  Procedure Laterality Date   AORTIC VALVE REPLACEMENT     CARDIOVERSION     CATARACT EXTRACTION Bilateral    HERNIA REPAIR       reports that he has never smoked. He quit smokeless tobacco use about 5 years ago.  His smokeless tobacco use included chew. He reports that he does not currently use alcohol. No history on file for drug use.  Allergies  Allergen Reactions   Penicillins Rash    Family History  Problem Relation Age of Onset   Coronary artery disease Mother    Coronary artery disease Father    Lung cancer Child    Drug abuse Child     Home Medications: Prior to Admission medications   Medication Sig Start Date End Date Taking? Authorizing Provider  albuterol (VENTOLIN HFA) 108 (90 Base) MCG/ACT inhaler Inhale 1-2 puffs into the lungs every 4 (four) hours as needed for wheezing or shortness of breath. 08/21/21   Burnadette Pop, MD  amiodarone (PACERONE) 200 MG tablet Take 0.5 tablets (100 mg total) by mouth daily. 10/19/22   Jodelle Red, MD  aspirin EC 81 MG tablet Take 1 tablet (81 mg total) by mouth daily with breakfast. 10/21/18   Shon Hale, MD  cholecalciferol (VITAMIN D3) 25 MCG (1000 UNIT) tablet Take 1,000 Units by mouth daily.    [provider]  divalproex (DEPAKOTE) 250 MG DR tablet Take 250 mg by mouth at bedtime.    [provider]  finasteride  (PROSCAR) 5 MG tablet Take 1 tablet (5 mg total) by mouth daily. 01/24/23   Stoneking, Danford Bad., MD  fluticasone (FLONASE) 50 MCG/ACT nasal spray Place 1 spray into both nostrils daily.    [provider]  guaiFENesin-dextromethorphan (ROBITUSSIN DM) 100-10 MG/5ML syrup Take 5 mLs by mouth every 4 (four) hours as needed for cough. 08/21/21   Burnadette Pop, MD  ipratropium-albuterol (DUONEB) 0.5-2.5 (3) MG/3ML SOLN Take 3 mLs by nebulization every 6 (six) hours as needed. 08/21/21   Burnadette Pop, MD  rosuvastatin (CRESTOR) 20 MG tablet Take 1 tablet (20 mg total) by mouth every evening. 10/21/18 05/04/22  Shon Hale, MD  sodium chloride (OCEAN) 0.65 % SOLN nasal spray Place 1 spray into both nostrils as needed for congestion. 07/17/19   Darr, Gerilyn Pilgrim, PA-C  SYMBICORT 80-4.5 MCG/ACT inhaler Inhale 2 puffs by mouth twice daily 10/22/19   Mannam, Colbert Coyer, MD  tamsulosin (FLOMAX) 0.4 MG CAPS capsule Take 0.4 mg by mouth daily after supper.     [provider]  warfarin (COUMADIN) 2.5 MG tablet Take  0.5-1 tablet daily or as prescribed by [facility] Patient taking differently: Take 1.25-2.5 mg by mouth daily. 2.5 mg on Sunday, Tuesday,Thursday,  1.25 mg on Monday, Wednesday, Friday & Saturday 04/23/20   Jodelle Red, MD    Review of Systems:  Review of Systems  Unable to perform ROS: Mental acuity  Cardiovascular:  Negative for chest pain and palpitations.    Physical Exam:  Vitals:   05/24/23 1036 05/24/23 1039 05/24/23 1040 05/24/23 1255  BP:    (!) 153/101  Pulse: (!) 56 (!) 56 (!) 50 (!) 51  Resp: 17 16 11 15   Temp:    (!) 97.4 F (36.3 C)  TempSrc:    Oral  SpO2: 96% 98% 100% 100%   Physical Exam Constitutional:      General: He is not in acute distress.    Appearance: He is not ill-appearing.  HENT:     Head: Normocephalic and atraumatic.     Mouth/Throat:     Mouth: Mucous membranes are dry.  Eyes:     Extraocular Movements: Extraocular  movements intact.  Cardiovascular:     Rate and Rhythm: Bradycardia present. Rhythm irregular.  Pulmonary:     Effort: Pulmonary effort is normal. No respiratory distress.     Breath sounds: Normal breath sounds.  Abdominal:     General: Bowel sounds are normal. There is no distension.     Palpations: Abdomen is soft.     Tenderness: There is no abdominal tenderness.  Musculoskeletal:        General: Normal range of motion.     Cervical back: Normal range of motion and neck supple.     Right lower leg: Edema present.     Left lower leg: Edema present.  Skin:    General: Skin is warm and dry.  Neurological:     Mental Status: He is alert. Mental status is at baseline. He is disoriented.      Labs on Admission:  I have personally reviewed following labs and imaging studies Results for orders placed or performed during the hospital encounter of 05/24/23 (from the past 24 hours)  Basic metabolic panel     Status: Abnormal   Collection Time: 05/24/23 11:37 AM  Result Value Ref Range   Sodium 139 135 - 145 mmol/L   Potassium 2.8 (L) 3.5 - 5.1 mmol/L   Chloride 103 98 - 111 mmol/L   CO2 27 22 - 32 mmol/L   Glucose, Bld 92 70 - 99 mg/dL   BUN 24 (H) 8 - 23 mg/dL   Creatinine, Ser 1.61 (H) 0.61 - 1.24 mg/dL   Calcium 8.9 8.9 - 09.6 mg/dL   GFR, Estimated 33 (L) >60 mL/min   Anion gap 9 5 - 15  Protime-INR     Status: Abnormal   Collection Time: 05/24/23 11:37 AM  Result Value Ref Range   Prothrombin Time 33.4 (H) 11.4 - 15.2 seconds   INR 3.3 (H) 0.8 - 1.2  CBC     Status: Abnormal   Collection Time: 05/24/23 11:37 AM  Result Value Ref Range   WBC 1.9 (L) 4.0 - 10.5 K/uL   RBC 3.90 (L) 4.22 - 5.81 MIL/uL   Hemoglobin 11.4 (L) 13.0 - 17.0 g/dL   HCT 04.5 (L) 40.9 - 81.1 %   MCV 92.8 80.0 - 100.0 fL   MCH 29.2 26.0 - 34.0 pg   MCHC 31.5 30.0 - 36.0 g/dL   RDW 91.4 (H) 78.2 - 95.6 %  Platelets 55 (L) 150 - 400 K/uL   nRBC 0.0 0.0 - 0.2 %     Radiological Exams on  Admission: No results found. No orders to display    Consults called:  Palliative care  Urology    EKG: Independently reviewed. Afib slow VR   Lewie Chamber, MD [facility] 05/24/2023, 2:31 PM

## 2023-05-24 NOTE — Telephone Encounter (Signed)
 Marland Kitchen

## 2023-05-24 NOTE — Consult Note (Signed)
 Consultation Note Date: 05/24/2023   Patient Name: Tanner Ferguson  DOB: May 25, 1927  MRN: [id number]  Age / Sex: 89 y.o., male   PCP: Georgann Housekeeper, MD Referring Physician: Lewie Chamber, MD  Reason for Consultation: Establishing goals of care     Chief Complaint/History of Present Illness:   Patient is a 89 year old male with a past medical history of dementia, essential tremor, prostate cancer status post brachytherapy, paroxysmal A-fib, aortic stenosis status post mechanical AV in 1995, OSA, CAD, hypertension, and hyperlipidemia who presented to the ER on 05/24/2023 for management of worsening agitation.  Family had reported that he had had decreased appetite, associated weight loss, and increasing agitation at home over the past month.  Upon presentation, patient noted to have urinary retention which required Foley catheter placement and yielded gross hematuria.  Urology consulted for recommendations as well.  Palliative medicine team consulted to assist with complex medical decision making.  Extensive review of EMR prior to presenting to bedside.  BMP noted creatinine to be 1.88 with GFR of 33.  CBC noted WBC 1.9 and platelets 55.  Reviewed urology's recommendations from note as well.  Presented to bedside to see patient.  Patient laying in bed, essential tremor noted though otherwise appeared comfortable.  Patient's daughter-in-law, Tanner Ferguson, present at bedside.  Tanner Ferguson noted that patient's HCPOA, her husband Tanner Ferguson had gone home to get some rest since he had been awake all night.  Patient able to note that he feels better at this time.  Discussed care with daughter-in-law at bedside.  Daughter-in-law described patient's worsening condition over the past month.  She noted that after patient had the catheter placed, patient appears much less agitated and more comfortable.  Daughter-in-law noted that patient's color even looks better at this point as he is not as pale.  At this time family planning  to continue appropriate medical interventions including catheter.  Daughter-in-law noted that patient previously had a catheter though this was removed as she and her husband were concerned they could not care for catheter at home.  Expressed concern that if patient was taking medications to help with urination and had retention in spite of this, catheter may be needed permanently moving forward.  Defer to urology for management.  Spent time inquiring about hopes for medical care moving forward.  Daughter-in-law noted that she ultimately deferred to her husband who is patient's HCPOA.  Based on how patient looks at this time, she is wondering if they can take patient back home to care for him.  They were considering inpatient hospice though based on patient's current status, does not appear appropriate for this.  Discussed going home with hospice support may be an appropriate option.  Family had specifically requested [facility] the [place] so noted would reach out to their liaison for information.  Wife noted that family's primary concern was that patient be able to continue on his Coumadin with hospice.  Noted that continuing on Coumadin would be appropriate with his mechanical valve though will reach out to hospice liaison to confirm this.  They have a machine to check patient's INR at home so this would not be a problem and they could call weekly to coordinate with the Coumadin clinic.  Daughter also expressed concern about antibiotics at home this.  Discussed patient's on hospice can receive oral antibiotics at home if infection is a concern.  Spent time answering questions as able.  Noted no changes in medical care would be occurring at this time.  Continuing  appropriate medical interventions as per hospice.  Will attempt to obtain information from liaison of [facility] the [place] to have to give family if determine would like to proceed with hospice support at home.  Daughter-in-law acknowledged  this.  Spent time answering questions as able.  Noted palliative medicine team continue to follow along with patient's medical journey.  Discussed care with hospitalist and hosp[facility] of the [facility] liaison after visit.  Primary Diagnoses  Present on Admission:  Acute urinary retention  Atrial fibrillation (HCC)  Essential tremor  Hyperlipidemia  Hypertension  Bradycardia  Dementia with behavioral disturbance (HCC)  Stage 3b chronic kidney disease (CKD) (HCC)   Past Medical History:  Diagnosis Date   Asthma    CAD (coronary artery disease)    s/p NSTEMI 9/19. Cath in [place], [place] 10/19. Severe CAD not amenable to PCI. Not CABG candidate   CHF (congestive heart failure) (HCC)    EF 40-45% by echo due to iCM. EF 36% by Myoview 9/19   Dementia (HCC)    Essential tremor    Hyperlipidemia    Intolerant of statins   Hypertension    PAF (paroxysmal atrial fibrillation) (HCC)    Pancytopenia (HCC)    Follows with hematology   Prostate cancer [facility])    Seizure (HCC)    Social History   Socioeconomic History   Marital status: Widowed    Spouse name: Not on file   Number of children: Not on file   Years of education: Not on file   Highest education level: Not on file  Occupational History   Occupation: Retired    Comment: Plumber  Tobacco Use   Smoking status: Never   Smokeless tobacco: Former    Types: Chew    Quit date: 10/25/2017  Vaping Use   Vaping status: Never Used  Substance and Sexual Activity   Alcohol use: Not Currently   Drug use: Not on file   Sexual activity: Not on file  Other Topics Concern   Not on file  Social History Narrative   Not on file   Social Drivers of Health   Financial Resource Strain: Not on file  Food Insecurity: No Food Insecurity (04/12/2022)   Hunger Vital Sign    Worried About Running Out of Food in the Last Year: Never true    Ran Out of Food in the Last Year: Never true  Transportation Needs: No Transportation Needs (04/12/2022)    PRAPARE - Administrator, Civil Service (Medical): No    Lack of Transportation (Non-Medical): No  Physical Activity: Not on file  Stress: Not on file  Social Connections: Not on file   Family History  Problem Relation Age of Onset   Coronary artery disease Mother    Coronary artery disease Father    Lung cancer Child    Drug abuse Child    Scheduled Meds:  potassium chloride  40 mEq Oral Q4H   warfarin  2.5 mg Oral ONCE-1600   Warfarin - Pharmacist Dosing Inpatient   Does not apply q1600   Continuous Infusions:  sodium chloride irrigation     PRN Meds:.haloperidol lactate, hydrALAZINE, LORazepam Allergies  Allergen Reactions   Penicillins Rash   CBC:    Component Value Date/Time   WBC 1.9 (L) 05/24/2023 1137   HGB 11.4 (L) 05/24/2023 1137   HGB 11.4 (L) 11/13/2018 1523   HCT 36.2 (L) 05/24/2023 1137   PLT 55 (L) 05/24/2023 1137   PLT 111 (L) 11/13/2018  1523   MCV 92.8 05/24/2023 1137   NEUTROABS 4.7 04/12/2022 0455   LYMPHSABS 0.4 (L) 04/12/2022 0455   MONOABS 0.4 04/12/2022 0455   EOSABS 0.0 04/12/2022 0455   BASOSABS 0.0 04/12/2022 0455   Comprehensive Metabolic Panel:    Component Value Date/Time   NA 139 05/24/2023 1137   K 2.8 (L) 05/24/2023 1137   CL 103 05/24/2023 1137   CO2 27 05/24/2023 1137   BUN 24 (H) 05/24/2023 1137   CREATININE 1.88 (H) 05/24/2023 1137   GLUCOSE 92 05/24/2023 1137   CALCIUM 8.9 05/24/2023 1137   AST 18 04/13/2022 0409   ALT 12 04/13/2022 0409   ALKPHOS 41 04/13/2022 0409   BILITOT 0.9 04/13/2022 0409   PROT 6.8 04/13/2022 0409   ALBUMIN 3.3 (L) 04/13/2022 0409    Physical Exam: Vital Signs: BP (!) 153/101 (BP Location: Left Arm)   Pulse (!) 51   Temp (!) 97.4 F (36.3 C) (Oral)   Resp 15   SpO2 100%  SpO2: SpO2: 100 % O2 Device:   O2 Flow Rate:   Intake/output summary:  Intake/Output Summary (Last 24 hours) at 05/24/2023 1514 Last data filed at 05/24/2023 1320 Gross per 24 hour  Intake --  Output  1560 ml  Net -1560 ml   LBM:   Baseline Weight:   Most recent weight:    General: NAD, can answer short simple questions with short answers, chronically ill-appearing Cardiovascular: Irregular rate and rhythm Respiratory: no increased work of breathing noted, not in respiratory distress Neuro: can answer short simple questions with short answers         Palliative Performance Scale: 30%               Additional Data Reviewed: Recent Labs    05/24/23 1137  WBC 1.9*  HGB 11.4*  PLT 55*  NA 139  BUN 24*  CREATININE 1.88*    Imaging: ECHOCARDIOGRAM COMPLETE    ECHOCARDIOGRAM REPORT       Patient Name:   JAJA SWITALSKI Date of Exam: 04/13/2022 Medical Rec #:  [id number]    Height:       60.0 in Accession #:    [id number]   Weight:       155.0 lb Date of Birth:  January 01, 1928    BSA:          1.675 m Patient Age:    89 years     BP:           111/50 mmHg Patient Gender: M            HR:           49 bpm. Exam Location:  Inpatient  Procedure: 2D Echo, Cardiac Doppler, Color Doppler and Intracardiac            Opacification Agent  Indications:    CHF/Atrial fibrillation   History:        Patient has prior history of Echocardiogram examinations, most                 recent 11/08/2018. CHF, CAD and Previous Myocardial Infarction,                 Arrythmias:Atrial Fibrillation; Risk Factors:Hypertension and                 Dyslipidemia. Cancer.                 Aortic Valve: 25 mm St. Jude mechanical valve is present in  the                 aortic position. Procedure Date: 1995.   Sonographer:    Milda Smart Referring Phys: [id number] DAVID MANUEL ORTIZ    Sonographer Comments: Technically difficult study due to poor echo windows. Image acquisition challenging due to patient body habitus and Image acquisition challenging due to respiratory motion. IMPRESSIONS   1. Left ventricular ejection fraction, by estimation, is 55%. The left ventricle has low normal function. The left  ventricle demonstrates regional wall motion abnormalities (see scoring diagram/findings for description). There is hypokinesis of the  apical inferoseptal LV segment and apex that is best appreciated on definity imaging.There is severe hypertrophy of the basal septal segment. The rest of the LV segments demonstrate mild concentric left ventricular hypertrophy. Left ventricular diastolic  parameters are consistent with Grade I diastolic dysfunction (impaired relaxation).  2. Right ventricular systolic function is low normal. The right ventricular size is mildly enlarged.  3. Left atrial size was severely dilated.  4. Right atrial size was mildly dilated.  5. The mitral valve is grossly normal. Mild mitral valve regurgitation. No evidence of mitral stenosis.  6. The aortic valve has been repaired/replaced. There is a 25 mm St. Jude mechanical valve present in the aortic position. Procedure Date: 1995. Echo findings are consistent with normal structure and function of the aortic valve prosthesis. Aortic valve  mean gradient measures 5.5 mmHg. Aortic valve Vmax measures 1.56 m/s. DI 0.5. EOA 1.5cm2. Mild perivalvular leak.  7. Tricuspid valve regurgitation is moderate.  8. Aortic dilatation noted. There is borderline dilatation of the ascending aorta, measuring 38 mm.  Comparison(s): Prior images reviewed Ferguson by Ferguson. Compared to prior TTE in 2020, EF appears slightly less robust with WMA as detailed above (prior TTE with EF 60%) with stable aortic valve gradients.  FINDINGS  Left Ventricle: There is hypokinesis of the apical inferoseptal LV segment and apex (best appreciated on definity imaging). Left ventricular ejection fraction, by estimation, is 55%. The left ventricle has low normal function. The left ventricle  demonstrates regional wall motion abnormalities. Definity contrast agent was given IV to delineate the left ventricular endocardial borders. The left ventricular internal cavity size was  normal in size. There is severe hypertrophy of the basal septal  segment. The rest of the LV segments demonstrate mild concentric left ventricular hypertrophy. Abnormal (paradoxical) septal motion consistent with post-operative status. Left ventricular diastolic parameters are consistent with Grade I diastolic  dysfunction (impaired relaxation).  Right Ventricle: The right ventricular size is mildly enlarged. No increase in right ventricular wall thickness. Right ventricular systolic function is low normal.  Left Atrium: Left atrial size was severely dilated.  Right Atrium: Right atrial size was mildly dilated.  Pericardium: There is no evidence of pericardial effusion.  Mitral Valve: The mitral valve is grossly normal. Mild mitral valve regurgitation. No evidence of mitral valve stenosis. MV peak gradient, 3.0 mmHg. The mean mitral valve gradient is 1.0 mmHg.  Tricuspid Valve: The tricuspid valve is normal in structure. Tricuspid valve regurgitation is moderate.  Aortic Valve: DI 0.5, EOA 1.6cm3. The aortic valve has been repaired/replaced. Aortic valve mean gradient measures 5.5 mmHg. Aortic valve peak gradient measures 9.7 mmHg. Aortic valve area, by VTI measures 1.61 cm. There is a 25 mm St. Jude mechanical  valve present in the aortic position. Procedure Date: 1995. Echo findings are consistent with normal structure and function of the aortic valve prosthesis.  Pulmonic Valve: The pulmonic  valve was normal in structure. Pulmonic valve regurgitation is not visualized.  Aorta: Aortic dilatation noted. There is borderline dilatation of the ascending aorta, measuring 38 mm.  IAS/Shunts: The atrial septum is grossly normal.    LEFT VENTRICLE PLAX 2D LVIDd:         4.60 cm     Diastology LVIDs:         3.40 cm     LV e' medial:    2.61 cm/s LV PW:         1.10 cm     LV E/e' medial:  24.1 LV IVS:        1.20 cm     LV e' lateral:   4.88 cm/s LVOT diam:     2.10 cm     LV E/e' lateral:  12.9 LV SV:         55 LV SV Index:   33 LVOT Area:     3.46 cm   LV Volumes (MOD) LV vol d, MOD A2C: 79.9 ml LV vol d, MOD A4C: 71.0 ml LV vol s, MOD A2C: 37.5 ml LV vol s, MOD A4C: 32.8 ml LV SV MOD A2C:     42.4 ml LV SV MOD A4C:     71.0 ml LV SV MOD BP:      39.4 ml  RIGHT VENTRICLE RV S prime:     6.66 cm/s TAPSE (M-mode): 1.9 cm  LEFT ATRIUM              Index        RIGHT ATRIUM           Index LA diam:        4.80 cm  2.87 cm/m   RA Area:     21.60 cm LA Vol (A2C):   102.0 ml 60.90 ml/m  RA Volume:   54.90 ml  32.78 ml/m LA Vol (A4C):   89.7 ml  53.55 ml/m LA Biplane Vol: 98.6 ml  58.87 ml/m  AORTIC VALVE AV Area (Vmax):    1.45 cm AV Area (Vmean):   1.39 cm AV Area (VTI):     1.61 cm AV Vmax:           155.50 cm/s AV Vmean:          110.500 cm/s AV VTI:            0.339 m AV Peak Grad:      9.7 mmHg AV Mean Grad:      5.5 mmHg LVOT Vmax:         65.30 cm/s LVOT Vmean:        44.400 cm/s LVOT VTI:          0.158 m LVOT/AV VTI ratio: 0.47   AORTA Ao Root diam: 3.60 cm Ao Asc diam:  3.80 cm  MITRAL VALVE               TRICUSPID VALVE MV Area (PHT): 1.92 cm    TR Peak grad:   26.0 mmHg MV Peak grad:  3.0 mmHg    TR Mean grad:   19.0 mmHg MV Mean grad:  1.0 mmHg    TR Vmax:        255.00 cm/s MV Vmax:       0.86 m/s    TR Vmean:       209.0 cm/s MV Vmean:      50.2 cm/s MV Decel Time: 396 msec    SHUNTS MR Peak  grad: 7.8 mmHg     Systemic VTI:  0.16 m MR Vmax:      139.47 cm/s  Systemic Diam: 2.10 cm MV E velocity: 63.00 cm/s MV A velocity: 83.60 cm/s MV E/A ratio:  0.75  Laurance Flatten MD Electronically signed by Laurance Flatten MD Signature Date/Time: 04/13/2022/10:44:40 AM      Final      I personally reviewed recent imaging.   Palliative Care Assessment and Plan Summary of Established Goals of Care and Medical Treatment Preferences   Patient is a 89 year old male with a past medical history of dementia, essential tremor,  prostate cancer status post brachytherapy, paroxysmal A-fib, aortic stenosis status post mechanical AV in 1995, OSA, CAD, hypertension, and hyperlipidemia who presented to the ER on 05/24/2023 for management of worsening agitation.  Family had reported that he had had decreased appetite, associated weight loss, and increasing agitation at home over the past month.  Upon presentation, patient noted to have urinary retention which required Foley catheter placement and yielded gross hematuria.  Urology consulted for recommendations as well.  Palliative medicine team consulted to assist with complex medical decision making.  # Complex medical decision making/goals of care  -Patient unable to participate in complex medical decision making due to known medical illness of severe dementia.  -Discussed care with patient's daughter-in-law at bedside.  Daughter-in-law noted her husband is patient's HCPOA.  Patient had worsening agitation and appetite over the past month.  Patient "looking better" as per daughter-in-law after Foley insertion for management of urinary retention.  Continuing appropriate medical management at this time.  Family had initially been considering inpatient hospice though patient does not appear appropriate for that at this time.  May need to consider home with hospice support if appropriate.  Palliative medicine team to continue to follow along and engage in conversations as able.  -  Code Status: Limited: Do not attempt resuscitation (DNR) -DNR-LIMITED -Do Not Intubate/DNI    # Psycho-social/Spiritual Support:  - Support System: Son, daughter-in-law  # Discharge Planning:  To Be Determined  Thank you for allowing the palliative care team to participate in the care [employer].  Alvester Morin, DO Palliative Care Provider PMT # [phone]  If patient remains symptomatic despite maximum doses, please call PMT at [phone] between 0700 and 1900. Outside of these hours, please call  attending, as PMT does not have night coverage.  Personally spent 80 minutes in patient care including extensive chart review (labs, imaging, progress/consult notes, vital signs), medically appropraite exam, discussed with treatment team, education to patient, family, and staff, documenting clinical information, medication review and management, coordination of care, and available advanced directive documents.

## 2023-05-24 NOTE — Assessment & Plan Note (Addendum)
-   1995 -Discontinue Coumadin; no benefit in Lovenox at this time either - Focus is on end-of-life care

## 2023-05-24 NOTE — ED Provider Notes (Signed)
 [place] EMERGENCY DEPARTMENT AT [facility] Provider Note   CSN: [id number] Arrival date & time: 05/24/23  0932     History  Chief Complaint  Patient presents with   Hematuria   Dysuria    Tanner Ferguson is a 89 y.o. male.   Hematuria  Dysuria Presenting symptoms: dysuria   Associated symptoms: hematuria   Patient history of dementia.  On Coumadin for mechanical aortic valve.  Today and yesterday has been passing blood clots from his urethra.  Has had previous catheter because of this.  Also been more agitated.  Baseline dementia however.  History comes from patient's daughter.     Home Medications Prior to Admission medications   Medication Sig Start Date End Date Taking? Authorizing Provider  albuterol (VENTOLIN HFA) 108 (90 Base) MCG/ACT inhaler Inhale 1-2 puffs into the lungs every 4 (four) hours as needed for wheezing or shortness of breath. 08/21/21   Burnadette Pop, MD  amiodarone (PACERONE) 200 MG tablet Take 0.5 tablets (100 mg total) by mouth daily. 10/19/22   Jodelle Red, MD  aspirin EC 81 MG tablet Take 1 tablet (81 mg total) by mouth daily with breakfast. 10/21/18   Shon Hale, MD  cholecalciferol (VITAMIN D3) 25 MCG (1000 UNIT) tablet Take 1,000 Units by mouth daily.    [provider]  divalproex (DEPAKOTE) 250 MG DR tablet Take 250 mg by mouth at bedtime.    [provider]  finasteride (PROSCAR) 5 MG tablet Take 1 tablet (5 mg total) by mouth daily. 01/24/23   Stoneking, Danford Bad., MD  fluticasone (FLONASE) 50 MCG/ACT nasal spray Place 1 spray into both nostrils daily.    [provider]  guaiFENesin-dextromethorphan (ROBITUSSIN DM) 100-10 MG/5ML syrup Take 5 mLs by mouth every 4 (four) hours as needed for cough. 08/21/21   Burnadette Pop, MD  ipratropium-albuterol (DUONEB) 0.5-2.5 (3) MG/3ML SOLN Take 3 mLs by nebulization every 6 (six) hours as needed. 08/21/21   Burnadette Pop, MD  rosuvastatin  (CRESTOR) 20 MG tablet Take 1 tablet (20 mg total) by mouth every evening. 10/21/18 05/04/22  Shon Hale, MD  sodium chloride (OCEAN) 0.65 % SOLN nasal spray Place 1 spray into both nostrils as needed for congestion. 07/17/19   Darr, Gerilyn Pilgrim, PA-C  SYMBICORT 80-4.5 MCG/ACT inhaler Inhale 2 puffs by mouth twice daily 10/22/19   Mannam, Colbert Coyer, MD  tamsulosin (FLOMAX) 0.4 MG CAPS capsule Take 0.4 mg by mouth daily after supper.     [provider]  warfarin (COUMADIN) 2.5 MG tablet Take 0.5-1 tablet daily or as prescribed by [facility] Patient taking differently: Take 1.25-2.5 mg by mouth daily. 2.5 mg on Sunday, Tuesday,Thursday,  1.25 mg on Monday, Wednesday, Friday & Saturday 04/23/20   Jodelle Red, MD      Allergies    Penicillins    Review of Systems   Review of Systems  Genitourinary:  Positive for dysuria and hematuria.    Physical Exam Updated Vital Signs BP (!) 153/101 (BP Location: Left Arm)   Pulse (!) 51   Temp (!) 97.4 F (36.3 C) (Oral)   Resp 15   SpO2 100%  Physical Exam Vitals and nursing note reviewed.  Abdominal:     Tenderness: There is abdominal tenderness.     Comments: Suprapubic tenderness and fullness.  Genitourinary:    Comments: Bedside ultrasound did not show large bladder with clots. Neurological:     Mental Status: He is alert. Mental status is at baseline.  ED Results / Procedures / Treatments   Labs (all labs ordered are listed, but only abnormal results are displayed) Labs Reviewed  BASIC METABOLIC PANEL - Abnormal; Notable for the following components:      Result Value   Potassium 2.8 (*)    BUN 24 (*)    Creatinine, Ser 1.88 (*)    GFR, Estimated 33 (*)    All other components within normal limits  PROTIME-INR - Abnormal; Notable for the following components:   Prothrombin Time 33.4 (*)    INR 3.3 (*)    All other components within normal limits  CBC - Abnormal; Notable for the following components:    WBC 1.9 (*)    RBC 3.90 (*)    Hemoglobin 11.4 (*)    HCT 36.2 (*)    RDW 15.9 (*)    Platelets 55 (*)    All other components within normal limits  URINALYSIS, ROUTINE W REFLEX MICROSCOPIC - Abnormal; Notable for the following components:   APPearance HAZY (*)    Hgb urine dipstick MODERATE (*)    Protein, ur 100 (*)    Bacteria, UA RARE (*)    Crystals PRESENT (*)    All other components within normal limits    EKG EKG Interpretation Date/Time:  Tuesday May 24 2023 11:35:46 EDT Ventricular Rate:  48 PR Interval:    QRS Duration:  137 QT Interval:  518 QTC Calculation: 463 R Axis:   -39  Text Interpretation: Atrial fibrillation Nonspecific IVCD with LAD Left ventricular hypertrophy Confirmed by Benjiman Core [phone]) on 05/24/2023 11:39:49 AM  Radiology No results found.  Procedures Procedures    Medications Ordered in ED Medications  sodium chloride irrigation 0.9 % 3,000 mL (3,000 mLs Irrigation New Bag/Given 05/24/23 1034)  haloperidol lactate (HALDOL) injection 2 mg (has no administration in time range)  LORazepam (ATIVAN) injection 1 mg (has no administration in time range)  hydrALAZINE (APRESOLINE) injection 10 mg (has no administration in time range)  potassium chloride (KLOR-CON) packet 40 mEq (40 mEq Oral Given 05/24/23 1439)  Warfarin - Pharmacist Dosing Inpatient (has no administration in time range)  warfarin (COUMADIN) tablet 2.5 mg (has no administration in time range)  cefTRIAXone (ROCEPHIN) 1 g in sodium chloride 0.9 % 100 mL IVPB (has no administration in time range)    ED Course/ Medical Decision Making/ A&P                                 Medical Decision Making Amount and/or Complexity of Data Reviewed Labs: ordered.  Risk Prescription drug management. Decision regarding hospitalization.   Patient with bladder outlet obstruction due to hematuria.  Is on anticoagulation.  INR was reportedly 3.5 yesterday.  Will place Foley catheter for  irrigation.  Will give continuous bladder irrigation check basic blood work.  Some improvement of bleeding still getting continuous bladder irrigation.  Has a pancytopenia.  White count low and platelets down to 55.  Most recent platelets were above 100.  Patient's family member states they have been seen for this before.  Will discuss with urology.  Has seen Dr. Pete Glatter in the past.  Also admit to hospitalist.  Goals of care will need to be discussed.  PCP was reportedly talking about hospice.  CRITICAL CARE Performed by: Benjiman Core Total critical care time: 30 minutes Critical care time was exclusive of separately billable procedures and treating other patients. Critical care was  necessary to treat or prevent imminent or life-threatening deterioration. Critical care was time spent personally by me on the following activities: development of treatment plan with patient and/or surrogate as well as nursing, discussions with consultants, evaluation of patient's response to treatment, examination of patient, obtaining history from patient or surrogate, ordering and performing treatments and interventions, ordering and review of laboratory studies, ordering and review of radiographic studies, pulse oximetry and re-evaluation of patient's condition.         Final Clinical Impression(s) / ED Diagnoses Final diagnoses:  Pancytopenia (HCC)  Gross hematuria  Bladder outlet obstruction  Atrial fibrillation, unspecified type [facility])    Rx / DC Orders ED Discharge Orders     None         Benjiman Core, MD 05/24/23 [phone]

## 2023-05-24 NOTE — Assessment & Plan Note (Signed)
-   No further mortality benefit with statin use

## 2023-05-24 NOTE — Assessment & Plan Note (Signed)
-   afib with slow VR; currently denies CP or palpitations but some possible limitations with dementia -Continue amiodarone and Coumadin

## 2023-05-24 NOTE — Telephone Encounter (Signed)
 Pt son called and wanted to let you know that his dad is in the hospital at [facility]. That they would follow up after he is discharged and that he is in the with Urological Issues and also found out he is Afib.

## 2023-05-24 NOTE — Assessment & Plan Note (Signed)
-   Slightly above goal possibly in setting of pain on admission -Continue comfort care

## 2023-05-24 NOTE — Assessment & Plan Note (Signed)
-   Similar episode March 2024 resolving with Foley placement and course of antibiotics - Has been compliant on finasteride at home -UA negative for signs of infection but treated with one-time dose of Rocephin empirically -Appreciate urology evaluation; has now been transitioned off of CBI - Continue Foley for comfort

## 2023-05-24 NOTE — Progress Notes (Addendum)
 PHARMACY - ANTICOAGULATION CONSULT NOTE  Pharmacy Consult for warfarin Indication: atrial fibrillation, mechanical valve  Allergies  Allergen Reactions   Penicillins Rash    Vital Signs: Temp: 97.4 F (36.3 C) (03/25 1255) Temp Source: Oral (03/25 1255) BP: 153/101 (03/25 1255) Pulse Rate: 51 (03/25 1255)  Labs: Recent Labs    05/23/23 0000 05/24/23 1137  HGB  --  11.4*  HCT  --  36.2*  PLT  --  55*  LABPROT  --  33.4*  INR 3.5* 3.3*  CREATININE  --  1.88*    CrCl cannot be calculated (Unknown ideal weight.).   Medical History: Past Medical History:  Diagnosis Date   Asthma    CAD (coronary artery disease)    s/p NSTEMI 9/19. Cath in [place], [place] 10/19. Severe CAD not amenable to PCI. Not CABG candidate   CHF (congestive heart failure) (HCC)    EF 40-45% by echo due to iCM. EF 36% by Myoview 9/19   Dementia (HCC)    Essential tremor    Hyperlipidemia    Intolerant of statins   Hypertension    PAF (paroxysmal atrial fibrillation) (HCC)    Pancytopenia (HCC)    Follows with hematology   Prostate cancer [facility])    Seizure (HCC)     Medications:  Warfarin 1.25 mg all days except 2.5 mg on Tuesdays  3/24 INR 3.5 - per anticoag clinic notes, pt advised to change warfarin dosing to above regimen  Assessment: 89 year old male presented to ED with urinary retention requiring Foley catheter placement, yielding gross hematuria. He was started on continuous bladder irrigation. Patient is on warfarin PTA for atrial fibrillation and mechanical aortic valve, which pharmacy has been consulted to continue.   Known history of pancytopenia. Plt at baseline ~ 100, down to 55 today. INR down to 3.3 from 3.5 yesterday.  Goal of Therapy:  INR 2.5-3.5 Monitor platelets by anticoagulation protocol: Yes   Plan:  -Med rec pending, confirmed with daughter in law at bedside that patient has not had his warfarin today -INR therapeutic, hematuria noted - continue with home dose of  warfarin 2.5 mg today -Daily INR -Monitor closely for new or worsening bleeding, hematuria noted  Pricilla Riffle, PharmD, BCPS Clinical Pharmacist 05/24/2023 2:09 PM

## 2023-05-24 NOTE — Assessment & Plan Note (Signed)
-   s/p brachytherapy over 10 years ago

## 2023-05-24 NOTE — Assessment & Plan Note (Signed)
 -  replete

## 2023-05-24 NOTE — Assessment & Plan Note (Signed)
-   Was referred to neurology previously for consideration of DBS - Given advanced dementia and progressive decline, supportive care for now.  Not a candidate for beta-blocker trial with bradycardia

## 2023-05-24 NOTE — Hospital Course (Addendum)
 Tanner Ferguson is a 89 yo male with advanced dementia, PAF, AS s/p mechanical AV 1995, mitral regurg, OSA, CAD, HTN, HLD, essential tremor, prostate cancer s/p brachytherapy.  He presented with urinary retention and required Foley catheter placement in the ER yielding gross hematuria.  Family also reports that he has had noticeably decreased appetite over the past 1 month with associated weight loss as well as increasing agitation and difficulty managing the patient at home.  They feel that his dementia has worsened and are unsure if able to continue caring for him at home.  He had a similar episode of urinary retention with hematuria in March 2024.  This was treated with Foley catheter placement and antibiotic course along with Flomax and finasteride.  INR 3.3 on admission. After Foley catheter placement in the ER, he was initiated on CBI and urology was consulted.  Palliative care also consulted for assistance with GOC discussions. He had further decline after admission and with ongoing goals of care discussions, family elected for transitioning further to comfort care.  His agitation and respiratory distress responded well to morphine and was transitioned to a morphine drip. He passed away on 2023/06/15 at 1555 with family present.

## 2023-05-24 NOTE — Consult Note (Signed)
 Urology Consult   Physician requesting consult: Lewie Chamber  Reason for consult: Hematuria  History of Present Illness: Tanner Ferguson is a 89 y.o. past medical history significant for prostate cancer status post brachytherapy approximately 10 years ago, proximal A-fib on amiodarone and Coumadin, severe dementia, pancytopenia, who presents with acute onset urinary retention.  The majority of the history was obtained from the patient's daughter-in-law.  Given his severe dementia, he lives with family full-time.  He was seen in the hospital approximately 1 year ago by urology for hematuria.  He was seen in the outpatient setting by Dr. Pete Glatter, where it was noted that he had prostatic urethral changes consistent with radiation-induced cystitis.  He was started on Flomax and finasteride, and has not had any issues since.  The daughter-in-law states that he has become more confused over the last 2 to 3 weeks.  During this time, she has noted that he has had urinate more frequently, or at least had the urge to urinate.  Over the last 24 hours, the family started to notice blood in the patient's urine.  Then, at approximately 2 PM, the patient was unable to void, and ultimately required presentation to the emergency department.  In the emergency department, 24 French three-way catheter was placed.  A bedside ultrasound by the emergency department showed a significant clot burden, and the patient was irrigated and started on CBI.  His lab work in the emergency department is significant for hypokalemia, INR of 3.3 from platelets of 55, white blood cell 1.9.  His creatinine is 1.88, which appears elevated above his baseline of 1.4.  His urinalysis has rare bacteria and moderate hemoglobin, but is otherwise unremarkable  He denies any other voiding or urinary symptoms other than what is described in the HPI as above.  Past Medical History:  Diagnosis Date   Asthma    CAD (coronary artery disease)     s/p NSTEMI 9/19. Cath in [place], [place] 10/19. Severe CAD not amenable to PCI. Not CABG candidate   CHF (congestive heart failure) (HCC)    EF 40-45% by echo due to iCM. EF 36% by Myoview 9/19   Dementia (HCC)    Essential tremor    Hyperlipidemia    Intolerant of statins   Hypertension    PAF (paroxysmal atrial fibrillation) (HCC)    Pancytopenia (HCC)    Follows with hematology   Prostate cancer (HCC)    Seizure (HCC)     Past Surgical History:  Procedure Laterality Date   AORTIC VALVE REPLACEMENT     CARDIOVERSION     CATARACT EXTRACTION Bilateral    HERNIA REPAIR      Current Hospital Medications:  Home Meds:  No current facility-administered medications on file prior to encounter.   Current Outpatient Medications on File Prior to Encounter  Medication Sig Dispense Refill   albuterol (VENTOLIN HFA) 108 (90 Base) MCG/ACT inhaler Inhale 1-2 puffs into the lungs every 4 (four) hours as needed for wheezing or shortness of breath. 8 g 1   amiodarone (PACERONE) 200 MG tablet Take 0.5 tablets (100 mg total) by mouth daily. 90 tablet 1   aspirin EC 81 MG tablet Take 1 tablet (81 mg total) by mouth daily with breakfast. 30 tablet 1   cholecalciferol (VITAMIN D3) 25 MCG (1000 UNIT) tablet Take 1,000 Units by mouth daily.     divalproex (DEPAKOTE) 250 MG DR tablet Take 250 mg by mouth at bedtime.     finasteride (PROSCAR) 5 MG  tablet Take 1 tablet (5 mg total) by mouth daily. 90 tablet 3   fluticasone (FLONASE) 50 MCG/ACT nasal spray Place 1 spray into both nostrils daily.     guaiFENesin-dextromethorphan (ROBITUSSIN DM) 100-10 MG/5ML syrup Take 5 mLs by mouth every 4 (four) hours as needed for cough. 118 mL 0   ipratropium-albuterol (DUONEB) 0.5-2.5 (3) MG/3ML SOLN Take 3 mLs by nebulization every 6 (six) hours as needed. 360 mL 0   rosuvastatin (CRESTOR) 20 MG tablet Take 1 tablet (20 mg total) by mouth every evening. 30 tablet 1   sodium chloride (OCEAN) 0.65 % SOLN nasal spray Place  1 spray into both nostrils as needed for congestion. 44 mL 0   SYMBICORT 80-4.5 MCG/ACT inhaler Inhale 2 puffs by mouth twice daily 10.2 g 0   tamsulosin (FLOMAX) 0.4 MG CAPS capsule Take 0.4 mg by mouth daily after supper.      warfarin (COUMADIN) 2.5 MG tablet Take 0.5-1 tablet daily or as prescribed by Coumadin Clinic (Patient taking differently: Take 1.25-2.5 mg by mouth daily. 2.5 mg on Sunday, Tuesday,Thursday,  1.25 mg on Monday, Wednesday, Friday & Saturday) 90 tablet 1     Scheduled Meds:  potassium chloride  40 mEq Oral Q4H   warfarin  2.5 mg Oral ONCE-1600   Warfarin - Pharmacist Dosing Inpatient   Does not apply q1600   Continuous Infusions:  sodium chloride irrigation     PRN Meds:.haloperidol lactate, hydrALAZINE, LORazepam  Allergies:  Allergies  Allergen Reactions   Penicillins Rash    Family History  Problem Relation Age of Onset   Coronary artery disease Mother    Coronary artery disease Father    Lung cancer Child    Drug abuse Child     Social History:  reports that he has never smoked. He quit smokeless tobacco use about 5 years ago.  His smokeless tobacco use included chew. He reports that he does not currently use alcohol. No history on file for drug use.  ROS: A complete review of systems was performed.  All systems are negative except for pertinent findings as noted.  Physical Exam:  Vital signs in last 24 hours: Temp:  [97.4 F (36.3 C)-97.6 F (36.4 C)] 97.4 F (36.3 C) (03/25 1255) Pulse Rate:  [50-61] 51 (03/25 1255) Resp:  [11-20] 15 (03/25 1255) BP: (138-153)/(99-101) 153/101 (03/25 1255) SpO2:  [96 %-100 %] 100 % (03/25 1255) Constitutional: Resting in bed, in no acute distress.   Cardiovascular: Regular rate and rhythm, No JVD Respiratory: Normal respiratory effort, Lungs clear bilaterally GI: Abdomen is soft, nontender, nondistended, no abdominal masses GU: Foley catheter in place draining light pink urine. CBI on slow  gtt Neurologic: Grossly intact, no focal deficits Psychiatric: Normal mood and affect  Laboratory Data:  Recent Labs    05/24/23 1137  WBC 1.9*  HGB 11.4*  HCT 36.2*  PLT 55*    Recent Labs    05/24/23 1137  NA 139  K 2.8*  CL 103  GLUCOSE 92  BUN 24*  CALCIUM 8.9  CREATININE 1.88*     Results for orders placed or performed during the hospital encounter of 05/24/23 (from the past 24 hours)  Basic metabolic panel     Status: Abnormal   Collection Time: 05/24/23 11:37 AM  Result Value Ref Range   Sodium 139 135 - 145 mmol/L   Potassium 2.8 (L) 3.5 - 5.1 mmol/L   Chloride 103 98 - 111 mmol/L   CO2 27 22 -  32 mmol/L   Glucose, Bld 92 70 - 99 mg/dL   BUN 24 (H) 8 - 23 mg/dL   Creatinine, Ser 0.98 (H) 0.61 - 1.24 mg/dL   Calcium 8.9 8.9 - 11.9 mg/dL   GFR, Estimated 33 (L) >60 mL/min   Anion gap 9 5 - 15  Protime-INR     Status: Abnormal   Collection Time: 05/24/23 11:37 AM  Result Value Ref Range   Prothrombin Time 33.4 (H) 11.4 - 15.2 seconds   INR 3.3 (H) 0.8 - 1.2  CBC     Status: Abnormal   Collection Time: 05/24/23 11:37 AM  Result Value Ref Range   WBC 1.9 (L) 4.0 - 10.5 K/uL   RBC 3.90 (L) 4.22 - 5.81 MIL/uL   Hemoglobin 11.4 (L) 13.0 - 17.0 g/dL   HCT 14.7 (L) 82.9 - 56.2 %   MCV 92.8 80.0 - 100.0 fL   MCH 29.2 26.0 - 34.0 pg   MCHC 31.5 30.0 - 36.0 g/dL   RDW 13.0 (H) 86.5 - 78.4 %   Platelets 55 (L) 150 - 400 K/uL   nRBC 0.0 0.0 - 0.2 %  Urinalysis, Routine w reflex microscopic -Urine, Unspecified Source     Status: Abnormal   Collection Time: 05/24/23  1:25 PM  Result Value Ref Range   Color, Urine YELLOW YELLOW   APPearance HAZY (A) CLEAR   Specific Gravity, Urine 1.009 1.005 - 1.030   pH 7.0 5.0 - 8.0   Glucose, UA NEGATIVE NEGATIVE mg/dL   Hgb urine dipstick MODERATE (A) NEGATIVE   Bilirubin Urine NEGATIVE NEGATIVE   Ketones, ur NEGATIVE NEGATIVE mg/dL   Protein, ur 696 (A) NEGATIVE mg/dL   Nitrite NEGATIVE NEGATIVE   Leukocytes,Ua  NEGATIVE NEGATIVE   RBC / HPF >50 0 - 5 RBC/hpf   WBC, UA >50 0 - 5 WBC/hpf   Bacteria, UA RARE (A) NONE SEEN   Squamous Epithelial / HPF 0-5 0 - 5 /HPF   WBC Clumps PRESENT    Crystals PRESENT (A) NEGATIVE   No results found for this or any previous visit (from the past 240 hours).  Renal Function: Recent Labs    05/24/23 1137  CREATININE 1.88*   CrCl cannot be calculated (Unknown ideal weight.).  Radiologic Imaging: No results found.  I independently reviewed the above imaging studies.  Impression/Recommendation 89 year old male with advanced dementia, PAF and ALS on Coumadin, remote history of prostate cancer status post brachytherapy that presents with urinary retention and hematuria.  #Hematuria Patient's history of radiation likely the source of his hematuria, in combination with his low platelet count and elevated INR.  Additionally, upon speaking with caregiver, it appears that his dementia has worsened over the last few weeks, which may further contribute to his urinary retention and subsequent urothelial stress. low concern for UTI given nonconcerning UA, but will treat with prophylactic antibiotic dose given Foley placement and vigorous irrigation.  At the bedside, I was able to flush his bladder with a 24 three-way catheter both quantitatively and qualitatively.  The catheter flushed very easily, and I was only able to extract 2-3 small clots.  Resulting urine was light pink, CBI was restarted at a slow drip -Continue CBI at slow drip, urology will reevaluate and consider clamping tomorrow morning -N.p.o. at midnight -Bladder ultrasound to evaluate clot burden -1 g ceftriaxone for UTI prophylaxis -Ensure dedicated urine culture sent -If hematuria unable to be controlled, will need to have discussion regarding optimal anticoagulation plan and  if these anticoagulants should be held for certain period of time. Given hemodynamic stability and stable blood levels, will  continue to evaluate   #Urinary Retention Likely secondary to hematuria.  Would recommend that patient continues home Flomax and Proscar, as this can often help alleviate any bleeding that is related to the prostate. -Continue Flomax and Proscar if medically safe to do so from a primary team perspective -Cont Catheter   Roby Lofts, MD Resident Physician [facility]    Zettie Pho 05/24/2023, 4:03 PM

## 2023-05-24 NOTE — ED Triage Notes (Signed)
 Pt arrives with Daughter in [place] who assists with information d/t pt's hx of dementia. She states that yesterday after lunch he began passing clots from his urethra and then around 1400 he was unable to urinate. Pt has hx of same, requiring catheterization. She also reports that pt has also had increasing agitation.

## 2023-05-24 NOTE — Assessment & Plan Note (Signed)
-   Asymptomatic and known; also see A-fib slow VR

## 2023-05-24 NOTE — Assessment & Plan Note (Addendum)
-   Family reports he has become more agitated recently and having more difficulty caring for him at home; feel that previous trial of Depakote made him worse as well as interacted with Coumadin -They also endorse that he has progressively lost weight over the past month and has been eating significantly less - palliative care following -Further GOC discussions held 05/25/2023 after he declined further since admission; we are now transitioning to comfort care and in-hospital death expected

## 2023-05-25 ENCOUNTER — Encounter (HOSPITAL_COMMUNITY): Payer: Self-pay | Admitting: Internal Medicine

## 2023-05-25 ENCOUNTER — Other Ambulatory Visit: Payer: Self-pay

## 2023-05-25 ENCOUNTER — Ambulatory Visit: Admitting: Urology

## 2023-05-25 DIAGNOSIS — R338 Other retention of urine: Secondary | ICD-10-CM | POA: Diagnosis not present

## 2023-05-25 DIAGNOSIS — F03918 Unspecified dementia, unspecified severity, with other behavioral disturbance: Secondary | ICD-10-CM | POA: Diagnosis not present

## 2023-05-25 DIAGNOSIS — Z515 Encounter for palliative care: Secondary | ICD-10-CM

## 2023-05-25 DIAGNOSIS — Z66 Do not resuscitate: Secondary | ICD-10-CM

## 2023-05-25 DIAGNOSIS — I480 Paroxysmal atrial fibrillation: Secondary | ICD-10-CM | POA: Diagnosis not present

## 2023-05-25 DIAGNOSIS — Z7189 Other specified counseling: Secondary | ICD-10-CM | POA: Diagnosis not present

## 2023-05-25 DIAGNOSIS — Z8546 Personal history of malignant neoplasm of prostate: Secondary | ICD-10-CM | POA: Diagnosis not present

## 2023-05-25 DIAGNOSIS — R4589 Other symptoms and signs involving emotional state: Secondary | ICD-10-CM

## 2023-05-25 DIAGNOSIS — D61818 Other pancytopenia: Secondary | ICD-10-CM | POA: Diagnosis not present

## 2023-05-25 DIAGNOSIS — Z79899 Other long term (current) drug therapy: Secondary | ICD-10-CM

## 2023-05-25 DIAGNOSIS — Z952 Presence of prosthetic heart valve: Secondary | ICD-10-CM | POA: Diagnosis not present

## 2023-05-25 LAB — BASIC METABOLIC PANEL
Anion gap: 13 (ref 5–15)
BUN: 27 mg/dL — ABNORMAL HIGH (ref 8–23)
CO2: 19 mmol/L — ABNORMAL LOW (ref 22–32)
Calcium: 8.7 mg/dL — ABNORMAL LOW (ref 8.9–10.3)
Chloride: 106 mmol/L (ref 98–111)
Creatinine, Ser: 2.11 mg/dL — ABNORMAL HIGH (ref 0.61–1.24)
GFR, Estimated: 29 mL/min — ABNORMAL LOW (ref >60)
Glucose, Bld: 129 mg/dL — ABNORMAL HIGH (ref 70–99)
Potassium: 3.2 mmol/L — ABNORMAL LOW (ref 3.5–5.1)
Sodium: 138 mmol/L (ref 135–145)

## 2023-05-25 LAB — CBC WITH DIFFERENTIAL/PLATELET
Abs Immature Granulocytes: 0.03 10*3/uL (ref 0.00–0.07)
Basophils Absolute: 0 10*3/uL (ref 0.0–0.1)
Basophils Relative: 0 %
Eosinophils Absolute: 0 10*3/uL (ref 0.0–0.5)
Eosinophils Relative: 0 %
HCT: 38.3 % — ABNORMAL LOW (ref 39.0–52.0)
Hemoglobin: 11.7 g/dL — ABNORMAL LOW (ref 13.0–17.0)
Immature Granulocytes: 1 %
Lymphocytes Relative: 6 %
Lymphs Abs: 0.3 10*3/uL — ABNORMAL LOW (ref 0.7–4.0)
MCH: 29 pg (ref 26.0–34.0)
MCHC: 30.5 g/dL (ref 30.0–36.0)
MCV: 94.8 fL (ref 80.0–100.0)
Monocytes Absolute: 0.2 10*3/uL (ref 0.1–1.0)
Monocytes Relative: 4 %
Neutro Abs: 4.9 10*3/uL (ref 1.7–7.7)
Neutrophils Relative %: 89 %
Platelets: 58 10*3/uL — ABNORMAL LOW (ref 150–400)
RBC: 4.04 MIL/uL — ABNORMAL LOW (ref 4.22–5.81)
RDW: 16 % — ABNORMAL HIGH (ref 11.5–15.5)
WBC: 5.5 10*3/uL (ref 4.0–10.5)
nRBC: 0 % (ref 0.0–0.2)

## 2023-05-25 LAB — PROTIME-INR
INR: 4.2 (ref 0.8–1.2)
Prothrombin Time: 40.7 s — ABNORMAL HIGH (ref 11.4–15.2)

## 2023-05-25 LAB — MAGNESIUM: Magnesium: 2 mg/dL (ref 1.7–2.4)

## 2023-05-25 MED ORDER — MORPHINE BOLUS VIA INFUSION: 2.0000 mg | INTRAVENOUS | Status: DC | PRN

## 2023-05-25 MED ORDER — GLYCOPYRROLATE 0.2 MG/ML IJ SOLN: 0.2000 mg | INTRAMUSCULAR | Status: DC | PRN

## 2023-05-25 MED ORDER — HALOPERIDOL LACTATE 5 MG/ML IJ SOLN: 0.5000 mg | INTRAMUSCULAR | Status: DC | PRN

## 2023-05-25 MED ORDER — MORPHINE SULFATE (PF) 2 MG/ML IV SOLN: 2.0000 mg | INTRAVENOUS | Status: DC | PRN

## 2023-05-25 MED ORDER — GLYCOPYRROLATE 1 MG PO TABS: 1.0000 mg | ORAL_TABLET | ORAL | Status: DC | PRN

## 2023-05-25 MED ORDER — LORAZEPAM 1 MG PO TABS: 1.0000 mg | ORAL_TABLET | ORAL | Status: DC | PRN

## 2023-05-25 MED ORDER — MORPHINE SULFATE (PF) 2 MG/ML IV SOLN
2.0000 mg | Freq: Once | INTRAVENOUS | Status: AC
  Administered 2023-05-25: 2 mg via INTRAVENOUS
  Filled 2023-05-25: qty 1

## 2023-05-25 MED ORDER — POLYVINYL ALCOHOL 1.4 % OP SOLN: 1.0000 [drp] | Freq: Four times a day (QID) | OPHTHALMIC | Status: DC | PRN

## 2023-05-25 MED ORDER — MORPHINE 100MG IN NS 100ML (1MG/ML) PREMIX INFUSION
3.0000 mg/h | INTRAVENOUS | Status: DC
  Administered 2023-05-25: 2 mg/h via INTRAVENOUS
  Administered 2023-05-27: 3 mg/h via INTRAVENOUS
  Filled 2023-05-25 (×3): qty 100

## 2023-05-25 MED ORDER — LEVALBUTEROL HCL 0.63 MG/3ML IN NEBU: 0.6300 mg | INHALATION_SOLUTION | Freq: Once | RESPIRATORY_TRACT | Status: DC | PRN

## 2023-05-25 MED ORDER — HALOPERIDOL 0.5 MG PO TABS: 0.5000 mg | ORAL_TABLET | ORAL | Status: DC | PRN

## 2023-05-25 MED ORDER — MORPHINE SULFATE (PF) 2 MG/ML IV SOLN: 1.0000 mg | Freq: Once | INTRAVENOUS | Status: DC | PRN

## 2023-05-25 MED ORDER — LORAZEPAM 2 MG/ML IJ SOLN
1.0000 mg | INTRAMUSCULAR | Status: DC | PRN
  Administered 2023-05-25: 1 mg via INTRAVENOUS
  Filled 2023-05-25: qty 1

## 2023-05-25 MED ORDER — MORPHINE SULFATE (PF) 2 MG/ML IV SOLN
2.0000 mg | INTRAVENOUS | Status: DC | PRN
  Administered 2023-05-25 (×2): 4 mg via INTRAVENOUS
  Filled 2023-05-25: qty 2

## 2023-05-25 MED ORDER — MORPHINE SULFATE (PF) 2 MG/ML IV SOLN
1.0000 mg | Freq: Once | INTRAVENOUS | Status: AC | PRN
  Administered 2023-05-25: 1 mg via INTRAVENOUS
  Filled 2023-05-25: qty 1

## 2023-05-25 MED ORDER — ONDANSETRON 4 MG PO TBDP: 4.0000 mg | ORAL_TABLET | Freq: Four times a day (QID) | ORAL | Status: DC | PRN

## 2023-05-25 MED ORDER — HALOPERIDOL LACTATE 2 MG/ML PO CONC: 0.5000 mg | ORAL | Status: DC | PRN

## 2023-05-25 MED ORDER — LORAZEPAM 2 MG/ML PO CONC: 1.0000 mg | ORAL | Status: DC | PRN

## 2023-05-25 MED ORDER — ONDANSETRON HCL 4 MG/2ML IJ SOLN: 4.0000 mg | Freq: Four times a day (QID) | INTRAMUSCULAR | Status: DC | PRN

## 2023-05-25 NOTE — Progress Notes (Addendum)
       Overnight   NAME: Tanner Ferguson MRN: [id number] DOB : 09-Apr-1927    Date of Service   05/25/2023   HPI/Events of Note    Notified by RN for continued agitation, and apparent discomfort, and increased work of breathing.  Patient is at approximately 1 month increased decline in health. Patient currently has CBI with amount out approximately equal to amount in at bedside visit.  Spoke with family at bedside they are aware that progression to home hospice or similar type is likely not feasible.  They may progress towards comfort care sooner than later.  CBI continues to flow a light pink color at a middle rate  Bladder scan yields no volume of retention during agitation.  Currently patient shows upper airway wheezing, increased work of breathing, hypertension, and very limited interaction. Temperature approximately 99.1-99.5 axillary.   Family is agreeable to trying small morphine amount to relieve work of breathing and any corresponding pain/discomfort.   Interventions/ Plan   Single dose morphine for work of breathing, discomfort. Continue urology orders for CBI. May consider comfort measures after morphine dosing.      Update 0601 Hrs Noticeable improvement after Morphine administration. Work of breathing improved and agitation is reduced   Monitor for duration of Morphine dosing. Maintain all previous Attending and Specialist orders.    Chinita Greenland BSN MSNA MSN ACNPC-AG Acute Care Nurse Practitioner [facility]

## 2023-05-25 NOTE — Consult Note (Addendum)
 Urology Consult   Physician requesting consult: Lewie Chamber  Reason for consult: Hematuria  History of Present Illness: Tanner Ferguson is a 89 y.o. past medical history significant for prostate cancer status post brachytherapy approximately 10 years ago, proximal A-fib on amiodarone and Coumadin, severe dementia, pancytopenia, who presents with acute onset urinary retention.  The majority of the history was obtained from the patient's daughter-in-law.  Given his severe dementia, he lives with family full-time.  He was seen in the hospital approximately 1 year ago by urology for hematuria.  He was seen in the outpatient setting by Dr. Pete Glatter, where it was noted that he had prostatic urethral changes consistent with radiation-induced cystitis.  He was started on Flomax and finasteride, and has not had any issues since.  The daughter-in-law states that he has become more confused over the last 2 to 3 weeks.  During this time, she has noted that he has had urinate more frequently, or at least had the urge to urinate.  Over the last 24 hours, the family started to notice blood in the patient's urine.  Then, at approximately 2 PM, the patient was unable to void, and ultimately required presentation to the emergency department.  In the emergency department, 24 French three-way catheter was placed.  A bedside ultrasound by the emergency department showed a significant clot burden, and the patient was irrigated and started on CBI.  His lab work in the emergency department is significant for hypokalemia, INR of 3.3 from platelets of 55, white blood cell 1.9.  His creatinine is 1.88, which appears elevated above his baseline of 1.4.  His urinalysis has rare bacteria and moderate hemoglobin, but is otherwise unremarkable  He denies any other voiding or urinary symptoms other than what is described in the HPI as above.  Past Medical History:  Diagnosis Date   Asthma    CAD (coronary artery disease)     s/p NSTEMI 9/19. Cath in [place], [place] 10/19. Severe CAD not amenable to PCI. Not CABG candidate   CHF (congestive heart failure) (HCC)    EF 40-45% by echo due to iCM. EF 36% by Myoview 9/19   Dementia (HCC)    Essential tremor    Hyperlipidemia    Intolerant of statins   Hypertension    PAF (paroxysmal atrial fibrillation) (HCC)    Pancytopenia (HCC)    Follows with hematology   Prostate cancer (HCC)    Seizure (HCC)     Past Surgical History:  Procedure Laterality Date   AORTIC VALVE REPLACEMENT     CARDIOVERSION     CATARACT EXTRACTION Bilateral    HERNIA REPAIR      Current Hospital Medications:  Home Meds:  No current facility-administered medications on file prior to encounter.   Current Outpatient Medications on File Prior to Encounter  Medication Sig Dispense Refill   albuterol (VENTOLIN HFA) 108 (90 Base) MCG/ACT inhaler Inhale 1-2 puffs into the lungs every 4 (four) hours as needed for wheezing or shortness of breath. 8 g 1   amiodarone (PACERONE) 200 MG tablet Take 0.5 tablets (100 mg total) by mouth daily. 90 tablet 1   aspirin EC 81 MG tablet Take 1 tablet (81 mg total) by mouth daily with breakfast. 30 tablet 1   cholecalciferol (VITAMIN D3) 25 MCG (1000 UNIT) tablet Take 1,000 Units by mouth daily.     divalproex (DEPAKOTE) 250 MG DR tablet Take 250 mg by mouth at bedtime.     finasteride (PROSCAR) 5 MG  tablet Take 1 tablet (5 mg total) by mouth daily. 90 tablet 3   fluticasone (FLONASE) 50 MCG/ACT nasal spray Place 1 spray into both nostrils daily.     guaiFENesin-dextromethorphan (ROBITUSSIN DM) 100-10 MG/5ML syrup Take 5 mLs by mouth every 4 (four) hours as needed for cough. 118 mL 0   ipratropium-albuterol (DUONEB) 0.5-2.5 (3) MG/3ML SOLN Take 3 mLs by nebulization every 6 (six) hours as needed. 360 mL 0   rosuvastatin (CRESTOR) 20 MG tablet Take 1 tablet (20 mg total) by mouth every evening. 30 tablet 1   sodium chloride (OCEAN) 0.65 % SOLN nasal spray Place  1 spray into both nostrils as needed for congestion. 44 mL 0   SYMBICORT 80-4.5 MCG/ACT inhaler Inhale 2 puffs by mouth twice daily 10.2 g 0   tamsulosin (FLOMAX) 0.4 MG CAPS capsule Take 0.4 mg by mouth daily after supper.      warfarin (COUMADIN) 2.5 MG tablet Take 0.5-1 tablet daily or as prescribed by [facility] (Patient taking differently: Take 1.25-2.5 mg by mouth daily. 2.5 mg on Sunday, Tuesday,Thursday,  1.25 mg on Monday, Wednesday, Friday & Saturday) 90 tablet 1     Scheduled Meds:  Warfarin - Pharmacist Dosing Inpatient   Does not apply q1600   Continuous Infusions:  sodium chloride irrigation     PRN Meds:.haloperidol lactate, hydrALAZINE, LORazepam, morphine injection  Allergies:  Allergies  Allergen Reactions   Penicillins Rash    Family History  Problem Relation Age of Onset   Coronary artery disease Mother    Coronary artery disease Father    Lung cancer Child    Drug abuse Child     Social History:  reports that he has never smoked. He quit smokeless tobacco use about 5 years ago.  His smokeless tobacco use included chew. He reports that he does not currently use alcohol. No history on file for drug use.  ROS: A complete review of systems was performed.  All systems are negative except for pertinent findings as noted.  Physical Exam:  Vital signs in last 24 hours: Temp:  [97.4 F (36.3 C)-99.5 F (37.5 C)] 99.5 F (37.5 C) (03/26 0459) Pulse Rate:  [50-89] 75 (03/26 0600) Resp:  [11-37] 15 (03/26 0600) BP: (138-171)/(72-156) 145/72 (03/26 0600) SpO2:  [87 %-100 %] 98 % (03/26 0600) Constitutional: Resting in bed, in no acute distress.   Cardiovascular: Regular rate and rhythm, No JVD Respiratory: Normal respiratory effort, Lungs clear bilaterally GI: Abdomen is soft, nontender, nondistended, no abdominal masses GU: Foley catheter in place draining light yellow urine. CBI on slow gtt Neurologic: Grossly intact, no focal deficits Psychiatric:  Normal mood and affect  Laboratory Data:  Recent Labs    05/24/23 1137 05/25/23 0533  WBC 1.9* 5.5  HGB 11.4* 11.7*  HCT 36.2* 38.3*  PLT 55* 58*    Recent Labs    05/24/23 1137 05/25/23 0533  NA 139 138  K 2.8* 3.2*  CL 103 106  GLUCOSE 92 129*  BUN 24* 27*  CALCIUM 8.9 8.7*  CREATININE 1.88* 2.11*     Results for orders placed or performed during the hospital encounter of 05/24/23 (from the past 24 hours)  Basic metabolic panel     Status: Abnormal   Collection Time: 05/24/23 11:37 AM  Result Value Ref Range   Sodium 139 135 - 145 mmol/L   Potassium 2.8 (L) 3.5 - 5.1 mmol/L   Chloride 103 98 - 111 mmol/L   CO2 27 22 -  32 mmol/L   Glucose, Bld 92 70 - 99 mg/dL   BUN 24 (H) 8 - 23 mg/dL   Creatinine, Ser 1.19 (H) 0.61 - 1.24 mg/dL   Calcium 8.9 8.9 - 14.7 mg/dL   GFR, Estimated 33 (L) >60 mL/min   Anion gap 9 5 - 15  Protime-INR     Status: Abnormal   Collection Time: 05/24/23 11:37 AM  Result Value Ref Range   Prothrombin Time 33.4 (H) 11.4 - 15.2 seconds   INR 3.3 (H) 0.8 - 1.2  CBC     Status: Abnormal   Collection Time: 05/24/23 11:37 AM  Result Value Ref Range   WBC 1.9 (L) 4.0 - 10.5 K/uL   RBC 3.90 (L) 4.22 - 5.81 MIL/uL   Hemoglobin 11.4 (L) 13.0 - 17.0 g/dL   HCT 82.9 (L) 56.2 - 13.0 %   MCV 92.8 80.0 - 100.0 fL   MCH 29.2 26.0 - 34.0 pg   MCHC 31.5 30.0 - 36.0 g/dL   RDW 86.5 (H) 78.4 - 69.6 %   Platelets 55 (L) 150 - 400 K/uL   nRBC 0.0 0.0 - 0.2 %  Urinalysis, Routine w reflex microscopic -Urine, Unspecified Source     Status: Abnormal   Collection Time: 05/24/23  1:25 PM  Result Value Ref Range   Color, Urine YELLOW YELLOW   APPearance HAZY (A) CLEAR   Specific Gravity, Urine 1.009 1.005 - 1.030   pH 7.0 5.0 - 8.0   Glucose, UA NEGATIVE NEGATIVE mg/dL   Hgb urine dipstick MODERATE (A) NEGATIVE   Bilirubin Urine NEGATIVE NEGATIVE   Ketones, ur NEGATIVE NEGATIVE mg/dL   Protein, ur 295 (A) NEGATIVE mg/dL   Nitrite NEGATIVE NEGATIVE    Leukocytes,Ua NEGATIVE NEGATIVE   RBC / HPF >50 0 - 5 RBC/hpf   WBC, UA >50 0 - 5 WBC/hpf   Bacteria, UA RARE (A) NONE SEEN   Squamous Epithelial / HPF 0-5 0 - 5 /HPF   WBC Clumps PRESENT    Crystals PRESENT (A) NEGATIVE  Basic metabolic panel     Status: Abnormal   Collection Time: 05/25/23  5:33 AM  Result Value Ref Range   Sodium 138 135 - 145 mmol/L   Potassium 3.2 (L) 3.5 - 5.1 mmol/L   Chloride 106 98 - 111 mmol/L   CO2 19 (L) 22 - 32 mmol/L   Glucose, Bld 129 (H) 70 - 99 mg/dL   BUN 27 (H) 8 - 23 mg/dL   Creatinine, Ser 2.84 (H) 0.61 - 1.24 mg/dL   Calcium 8.7 (L) 8.9 - 10.3 mg/dL   GFR, Estimated 29 (L) >60 mL/min   Anion gap 13 5 - 15  CBC with Differential/Platelet     Status: Abnormal   Collection Time: 05/25/23  5:33 AM  Result Value Ref Range   WBC 5.5 4.0 - 10.5 K/uL   RBC 4.04 (L) 4.22 - 5.81 MIL/uL   Hemoglobin 11.7 (L) 13.0 - 17.0 g/dL   HCT 13.2 (L) 44.0 - 10.2 %   MCV 94.8 80.0 - 100.0 fL   MCH 29.0 26.0 - 34.0 pg   MCHC 30.5 30.0 - 36.0 g/dL   RDW 72.5 (H) 36.6 - 44.0 %   Platelets 58 (L) 150 - 400 K/uL   nRBC 0.0 0.0 - 0.2 %   Neutrophils Relative % 89 %   Neutro Abs 4.9 1.7 - 7.7 K/uL   Lymphocytes Relative 6 %   Lymphs Abs 0.3 (L) 0.7 -  4.0 K/uL   Monocytes Relative 4 %   Monocytes Absolute 0.2 0.1 - 1.0 K/uL   Eosinophils Relative 0 %   Eosinophils Absolute 0.0 0.0 - 0.5 K/uL   Basophils Relative 0 %   Basophils Absolute 0.0 0.0 - 0.1 K/uL   Immature Granulocytes 1 %   Abs Immature Granulocytes 0.03 0.00 - 0.07 K/uL  Magnesium     Status: None   Collection Time: 05/25/23  5:33 AM  Result Value Ref Range   Magnesium 2.0 1.7 - 2.4 mg/dL   No results found for this or any previous visit (from the past 240 hours).  Renal Function: Recent Labs    05/24/23 1137 05/25/23 0533  CREATININE 1.88* 2.11*   CrCl cannot be calculated (Unknown ideal weight.).  Radiologic Imaging: US PELVIS LIMITED (TRANSABDOMINAL ONLY) Result Date:  05/24/2023 CLINICAL DATA:  History of BPH with urinary tract obstruction EXAM: LIMITED ULTRASOUND OF PELVIS TECHNIQUE: Limited transabdominal ultrasound examination of the pelvis was performed. COMPARISON:  04/11/2022 FINDINGS: The urinary bladder has a normal appearance for the degree of distention. Foley catheter is noted in place. IMPRESSION: Bladder is within normal limits with Foley catheter in place. Electronically Signed   By: Alcide Clever M.D.   On: 05/24/2023 19:03    I independently reviewed the above imaging studies.  Impression/Recommendation 89 year old male with advanced dementia, PAF and ALS on Coumadin, remote history of prostate cancer status post brachytherapy that presents with urinary retention and hematuria.  Update 05/25/2023:  Urine clear this AM on slow-mod gtt on CBI. CBI clamped at bedside. Ultrasound last night yielding no further clot burden. Pt with increased WOB, agitation overnight. Had extensive discussion with son at bedside re: ways to keep patietn comfortable  #Hematuria Patient's history of radiation likely the source of his hematuria. Clear this AM.  -CBI clamped, will CTM - if becomes grade IV or higher (see below), can consider restart of CBI -ultimately, favor that radiation cystitis picture in combination of other comorbidities (on anticoagulation, thrombocytopenic) contributing to hematuria. Correction of underlying issues may be the only way to definitive clear hematuria. However, given hemoglobin stable this morning, do not favor active, arterial bleeding - much more likely slow ooze from prostatic urethra.  -given pt agitation, foley catheter removal around midday may help prevent future catheter related irritaiton. However, if pt transitioning to comfort care only, would defer to pt and family wishes re: keeping catheter     #Urinary Retention Likely secondary to hematuria.  Would recommend that patient continues home Flomax and Proscar, as this can  often help alleviate any bleeding that is related to the prostate. -Continue Flomax and Proscar if medically safe to do so from a primary team perspective -Cont Catheter   Roby Lofts, MD Resident Physician [facility]    Zettie Pho 05/25/2023, 7:33 AM

## 2023-05-25 NOTE — Progress Notes (Signed)
 Daily Progress Note   Patient Name: Tanner Ferguson       Date: 05/25/2023 DOB: 09/30/1927  Age: 89 y.o. MRN#: [id number] Attending Physician: Lewie Chamber, MD Primary Care Physician: Georgann Housekeeper, MD Admit Date: 05/24/2023 Length of Stay: 1 day  Reason for Consultation/Follow-up: Establishing goals of care and symptom management  Subjective:   CC: Patient appears very agitated in bed.  Spoke with family at bedside.  Following up regarding complex medical decision making.  Subjective:  Reviewed EMR.  BMP showing worsening of creatinine to 2.11.  Reviewed documentation from overnight and spoke with hospitalist and nurse today.  Patient had worsening agitation starting at 5 PM yesterday.  Hospitalist already visited with family this morning and decision has been made to transition to full comfort focused care.  Presented to bedside to see patient in ER.  Patient's son and daughter-in-law present at bedside.  Again introduced myself as a member of the palliative medicine team.  Patient appears agitated with increased work of breathing laying in bed.  Discussed again goals when focusing on comfort.  Noted that during time at bedside discussing care with family, patient continued to have longer periods of apnea.  Based on patient's instability and likelihood of time getting very short, family opting for patient to remain here in the hospital for end-of-life care.  Discussed continued management with medicines for pain, work of breathing, and agitation.  Family agreeing to use of these medications for care at the end of life. During visit, RN presented to bedside and noted patient had room available upstairs.  Able to meet with family again once patient was upstairs in room.  RN able to provide patient with as needed dose of morphine and Ativan.  Patient still noted to have increased work of breathing.  Discussed with family and have placed order for continuous morphine infusion; family supporting of  this.  Again noted during examination patient continues to have longer periods of apnea, pale color, and pulse growing fainter. Spent time providing emotional support be active listening.  Family deny needing any spiritual support at this time.  All questions answered at that time.  Noted palliative medicine team continue to follow along with patient's medical journey.  Discussed care with hospitalist and RNs to coordinate care. Objective:   Vital Signs:  BP (!) 145/72   Pulse 75   Temp 99.4 F (37.4 C) (Axillary)   Resp 15   SpO2 98%   Physical Exam: General: Appears agitated, ill-appearing, frail HENT:  dry mucous membranes Cardiovascular: Irregular rate and rhythm Respiratory: increased work of breathing noted with use of accessory muscles Skin: Pale Neuro: Agitated  Imaging:  I personally reviewed recent imaging.   Assessment & Plan:   Assessment: Patient is a 89 year old male with a past medical history of dementia, essential tremor, prostate cancer status post brachytherapy, paroxysmal A-fib, aortic stenosis status post mechanical AV in 1995, OSA, CAD, hypertension, and hyperlipidemia who presented to the ER on 05/24/2023 for management of worsening agitation. Family had reported that he had had decreased appetite, associated weight loss, and increasing agitation at home over the past month. Upon presentation, patient noted to have urinary retention which required Foley catheter placement and yielded gross hematuria. Urology consulted for recommendations as well. Palliative medicine team consulted to assist with complex medical decision making.   Recommendations/Plan: # Complex medical decision making/goals of care:   -Patient unable to participate in complex medical decision making due to known medical illness of severe dementia.                -  Discussed care with patient's son and daughter-in-law as detailed above in HPI.  Patient appears to be actively transitioning at  end-of-life.  Continuing comfort focused care here in the hospital with anticipated hospital death.  -At this time we will discontinue interventions that are no longer focused on comfort such as IV fluids, imaging, or lab work.  Will instead focus on symptom management of pain, dyspnea, and agitation in the setting of end-of-life care.  Will continue Foley for comfort focused care; have discontinued continuous irrigation  -  Code Status: Do not attempt resuscitation (DNR) - Comfort care Prognosis: Hours - Days  # Symptom management Patient is receiving these palliative interventions for symptom management with an intent to improve quality of life.     -Pain/Dyspnea, acute in the setting of end-of-life care                 -Change IV morphine to 2-4 mg every 30 minute as needed   -Start morphine infusion 2 mg/h continuous infusion and bolus via infusion at 2 mg every 30 minutes.  Continue to adjust based on patient's symptom burden.                  -Anxiety/agitation, in the setting of end-of-life care                               -Continue IV Ativan 1 mg every 4 hours as needed. Continue to adjust based on patient's symptom burden.                                 -Continue IV Haldol 0.5 mg every 4 hours as needed. Continue to adjust based on patient's symptom burden.                   -Secretions, in the setting of end-of-life care                               -Continue IV glycopyrrolate 0.2 mg every 4 hours as needed.  # Discharge Planning: Anticipated Hospital Death  Discussed with: Patient's son and daughter-in-law, hospitalist, RNs  Thank you for allowing the palliative care team to participate in the care [facility].  Alvester Morin, DO Palliative Care Provider PMT # [phone]  If patient remains symptomatic despite maximum doses, please call PMT at [phone] between 0700 and 1900. Outside of these hours, please call attending, as PMT does not have night coverage.

## 2023-05-25 NOTE — Progress Notes (Signed)
 Progress Note    Tanner Ferguson   RUE:[id number]  DOB: [birth year]  DOA: 05/24/2023     1 PCP: Georgann Housekeeper, MD  Initial CC: Urinary retention  Hospital Course: Mr. Tanner Ferguson is a 89 yo male with advanced dementia, PAF, AS s/p mechanical AV 1995, mitral regurg, OSA, CAD, HTN, HLD, essential tremor, prostate cancer s/p brachytherapy.  He presented with urinary retention and required Foley catheter placement in the ER yielding gross hematuria.  Family also reports that he has had noticeably decreased appetite over the past 1 month with associated weight loss as well as increasing agitation and difficulty managing the patient at home.  They feel that his dementia has worsened and are unsure if able to continue caring for him at home.  He had a similar episode of urinary retention with hematuria in March 2024.  This was treated with Foley catheter placement and antibiotic course along with Flomax and finasteride.  INR 3.3 on admission. After Foley catheter placement in the ER, he was initiated on CBI and urology was consulted.  Palliative care also consulted for assistance with GOC discussions. He had further decline after admission and with ongoing goals of care discussions, family elected for transitioning further to comfort care.  His agitation and respiratory distress responded well to morphine and was transitioned to a morphine drip.  Interval History:  Family present bedside this morning.  Since admission yesterday he has further declined becoming more agitated, restless, increased work of breathing.  Further discussion held bedside this morning and given the overall functional decline over the past several months and his ongoing poor quality of life, they have elected for transitioning to full comfort care at this time. Due to his level of discomfort, we are transitioning to morphine drip as he responded well overnight and continuing hospitalization with expectant inpatient  death.  Assessment and Plan: * Acute urinary retention - Similar episode March 2024 resolving with Foley placement and course of antibiotics - Has been compliant on finasteride at home -UA negative for signs of infection but treated with one-time dose of Rocephin empirically -Appreciate urology evaluation; has now been transitioned off of CBI - Continue Foley for comfort  Atrial fibrillation (HCC) - afib with slow VR; currently denied CP or palpitations but some possible limitations with dementia -Discontinue medications now with transitioning to comfort care  Dementia with behavioral disturbance (HCC) - Family reports he has become more agitated recently and having more difficulty caring for him at home; feel that previous trial of Depakote made him worse as well as interacted with Coumadin -They also endorse that he has progressively lost weight over the past month and has been eating significantly less - palliative care following -Further GOC discussions held 05/25/2023 after he declined further since admission; we are now transitioning to comfort care and in-hospital death expected  [facility]top[facility]) - Seen by Dr. Pamelia Hoit in Sept 2020 for similar; at that time was felt to be multifactorial in setting of infection, renal function -Discussed bedside with family regarding further workup; given his advanced dementia and overall decline, they are okay with deferring further workup at this time especially as they would not pursue treatment depending on diagnosis - PLTC acutely lower than prior baseline, possible some acute bone marrow suppression from current acute illness - no further workup in setting of comfort care  Stage 3b chronic kidney disease (CKD) (HCC) - patient has history of CKD3b. Baseline creat ~ 1.6 - 1.7, eGFR~ 38   Hypokalemia - repleted  History of prostate cancer - s/p brachytherapy over 10 years ago  Bradycardia - Asymptomatic and known; also see A-fib slow  VR  Hypertension - Slightly above goal possibly in setting of pain on admission -Continue comfort care  Essential tremor - Was referred to neurology previously for consideration of DBS - Given advanced dementia and progressive decline, supportive care for now.  Not a candidate for beta-blocker trial with bradycardia  Hyperlipidemia - No further mortality benefit with statin use  History of mechanical aortic valve replacement - 1995 -Discontinue Coumadin; no benefit in Lovenox at this time either - Focus is on end-of-life care   Old records reviewed in assessment of this patient  Antimicrobials:   DVT prophylaxis:     Code Status:   Code Status: Do not attempt resuscitation (DNR) - Comfort care  Mobility Assessment (Last 72 Hours)     Mobility Assessment   No documentation.           Barriers to discharge:  Disposition Plan: In-hospital death Status is: Inpatient  Objective: Blood pressure (!) 146/76, pulse 71, temperature 97.7 F (36.5 C), temperature source Oral, resp. rate 18, SpO2 91%.  Examination:  Physical Exam Constitutional:      General: He is not in acute distress.    Appearance: He is not ill-appearing.     Comments: Able to talk some but much more uncomfortable appearing this morning, restless, and noticeable increased work of breathing  HENT:     Head: Normocephalic and atraumatic.     Mouth/Throat:     Mouth: Mucous membranes are dry.  Eyes:     Extraocular Movements: Extraocular movements intact.  Cardiovascular:     Rate and Rhythm: Normal rate. Rhythm irregular.  Pulmonary:     Effort: Pulmonary effort is normal. No respiratory distress.     Breath sounds: Normal breath sounds.  Abdominal:     General: Bowel sounds are normal. There is no distension.     Palpations: Abdomen is soft.     Tenderness: There is no abdominal tenderness.  Musculoskeletal:        General: Normal range of motion.     Cervical back: Normal range of motion  and neck supple.     Right lower leg: Edema present.     Left lower leg: Edema present.  Skin:    General: Skin is warm and dry.  Neurological:     Mental Status: He is alert. Mental status is at baseline. He is disoriented.      Consultants:  Palliative care  Procedures:    Data Reviewed: Results for orders placed or performed during the hospital encounter of 05/24/23 (from the past 24 hours)  Urinalysis, Routine w reflex microscopic -Urine, Unspecified Source     Status: Abnormal   Collection Time: 05/24/23  1:25 PM  Result Value Ref Range   Color, Urine YELLOW YELLOW   APPearance HAZY (A) CLEAR   Specific Gravity, Urine 1.009 1.005 - 1.030   pH 7.0 5.0 - 8.0   Glucose, UA NEGATIVE NEGATIVE mg/dL   Hgb urine dipstick MODERATE (A) NEGATIVE   Bilirubin Urine NEGATIVE NEGATIVE   Ketones, ur NEGATIVE NEGATIVE mg/dL   Protein, ur 161 (A) NEGATIVE mg/dL   Nitrite NEGATIVE NEGATIVE   Leukocytes,Ua NEGATIVE NEGATIVE   RBC / HPF >50 0 - 5 RBC/hpf   WBC, UA >50 0 - 5 WBC/hpf   Bacteria, UA RARE (A) NONE SEEN   Squamous Epithelial / HPF 0-5 0 - 5 /  HPF   WBC Clumps PRESENT    Crystals PRESENT (A) NEGATIVE  Basic metabolic panel     Status: Abnormal   Collection Time: 05/25/23  5:33 AM  Result Value Ref Range   Sodium 138 135 - 145 mmol/L   Potassium 3.2 (L) 3.5 - 5.1 mmol/L   Chloride 106 98 - 111 mmol/L   CO2 19 (L) 22 - 32 mmol/L   Glucose, Bld 129 (H) 70 - 99 mg/dL   BUN 27 (H) 8 - 23 mg/dL   Creatinine, Ser 8.11 (H) 0.61 - 1.24 mg/dL   Calcium 8.7 (L) 8.9 - 10.3 mg/dL   GFR, Estimated 29 (L) >60 mL/min   Anion gap 13 5 - 15  CBC with Differential/Platelet     Status: Abnormal   Collection Time: 05/25/23  5:33 AM  Result Value Ref Range   WBC 5.5 4.0 - 10.5 K/uL   RBC 4.04 (L) 4.22 - 5.81 MIL/uL   Hemoglobin 11.7 (L) 13.0 - 17.0 g/dL   HCT 91.4 (L) 78.2 - 95.6 %   MCV 94.8 80.0 - 100.0 fL   MCH 29.0 26.0 - 34.0 pg   MCHC 30.5 30.0 - 36.0 g/dL   RDW 21.3 (H) 08.6  - 15.5 %   Platelets 58 (L) 150 - 400 K/uL   nRBC 0.0 0.0 - 0.2 %   Neutrophils Relative % 89 %   Neutro Abs 4.9 1.7 - 7.7 K/uL   Lymphocytes Relative 6 %   Lymphs Abs 0.3 (L) 0.7 - 4.0 K/uL   Monocytes Relative 4 %   Monocytes Absolute 0.2 0.1 - 1.0 K/uL   Eosinophils Relative 0 %   Eosinophils Absolute 0.0 0.0 - 0.5 K/uL   Basophils Relative 0 %   Basophils Absolute 0.0 0.0 - 0.1 K/uL   Immature Granulocytes 1 %   Abs Immature Granulocytes 0.03 0.00 - 0.07 K/uL  Magnesium     Status: None   Collection Time: 05/25/23  5:33 AM  Result Value Ref Range   Magnesium 2.0 1.7 - 2.4 mg/dL  Protime-INR     Status: Abnormal   Collection Time: 05/25/23  5:33 AM  Result Value Ref Range   Prothrombin Time 40.7 (H) 11.4 - 15.2 seconds   INR 4.2 (HH) 0.8 - 1.2    I have reviewed pertinent nursing notes, vitals, labs, and images as necessary. I have ordered labwork to follow up on as indicated.  I have reviewed the last notes from staff over past 24 hours. I have discussed patient's care plan and test results with nursing staff, CM/SW, and other staff as appropriate.  Time spent: Greater than 50% of the 55 minute visit was spent in counseling/coordination of care for the patient as laid out in the A&P.   LOS: 1 day   Lewie Chamber, MD [facility] 05/25/2023, 12:51 PM

## 2023-05-26 DIAGNOSIS — R0602 Shortness of breath: Secondary | ICD-10-CM

## 2023-05-26 DIAGNOSIS — Z515 Encounter for palliative care: Secondary | ICD-10-CM | POA: Diagnosis not present

## 2023-05-26 DIAGNOSIS — R338 Other retention of urine: Secondary | ICD-10-CM | POA: Diagnosis not present

## 2023-05-26 DIAGNOSIS — F03918 Unspecified dementia, unspecified severity, with other behavioral disturbance: Secondary | ICD-10-CM | POA: Diagnosis not present

## 2023-05-26 DIAGNOSIS — Z66 Do not resuscitate: Secondary | ICD-10-CM | POA: Diagnosis not present

## 2023-05-26 DIAGNOSIS — D61818 Other pancytopenia: Secondary | ICD-10-CM | POA: Diagnosis not present

## 2023-05-26 DIAGNOSIS — R4589 Other symptoms and signs involving emotional state: Secondary | ICD-10-CM | POA: Diagnosis not present

## 2023-05-26 DIAGNOSIS — Z79899 Other long term (current) drug therapy: Secondary | ICD-10-CM | POA: Diagnosis not present

## 2023-05-26 DIAGNOSIS — I480 Paroxysmal atrial fibrillation: Secondary | ICD-10-CM | POA: Diagnosis not present

## 2023-05-26 NOTE — Progress Notes (Signed)
 Daily Progress Note   Patient Name: Tanner Ferguson       Date: 05/26/2023 DOB: [birth year]  Age: 89 y.o. MRN#: [id number] Attending Physician: Lewie Chamber, MD Primary Care Physician: Georgann Housekeeper, MD Admit Date: 05/24/2023 Length of Stay: 2 days  Reason for Consultation/Follow-up: Establishing goals of care and symptom management  Subjective:   CC: Patient unresponsive.  Spoke with family at bedside.  Following up regarding complex medical decision making.  Subjective: Reviewed EMR prior to presenting to bedside.  At time of EMR review in past 24 hours patient has received IV morphine 4 mg bolus x 2.  Patient continues on IV morphine infusion at 2 mg/h.  Patient also received IV Ativan 1 mg as needed x 1 dose.  Review of vital signs this morning show hypotension noted with blood pressure of 87/49.  Presented to bedside to see patient.  Patient laying unresponsive in bed.  Patient's daughter-in-law present at bedside.  Daughter noted distress about patient having increased work of breathing overnight hence requiring boluses.  Discussed medication management at this time.  Will increase morphine infusion to hopefully optimize patient's comfort at the end of life.  Discussed based on patient's blood pressure, not stable for transfer.  Continue to anticipate in-hospital death and matter of hours to short days.  Spent time providing emotional support reactive listening.  All questions answered at that time.  Noted palliative medicine team continue to follow with patient's medical journey.  Discussed care with RN to update regarding medication management.  Objective:   Vital Signs:  BP (!) 87/49 (BP Location: Left Arm)   Pulse 67   Temp 98.8 F (37.1 C) (Oral)   Resp 16   SpO2 96%   Physical Exam: General: Unresponsive, ill-appearing, frail HENT:  dry mucous membranes Cardiovascular: Irregular rate and rhythm, pulse weak Respiratory: Slightly increased work of breathing noted with use  of accessory muscles Skin: Pale Neuro: Unresponsive  Imaging:  I personally reviewed recent imaging.   Assessment & Plan:   Assessment: Patient is a 89 year old male with a past medical history of dementia, essential tremor, prostate cancer status post brachytherapy, paroxysmal A-fib, aortic stenosis status post mechanical AV in 1995, OSA, CAD, hypertension, and hyperlipidemia who presented to the ER on 05/24/2023 for management of worsening agitation. Family had reported that he had had decreased appetite, associated weight loss, and increasing agitation at home over the past month. Upon presentation, patient noted to have urinary retention which required Foley catheter placement and yielded gross hematuria. Urology consulted for recommendations as well. Palliative medicine team consulted to assist with complex medical decision making.   Recommendations/Plan: # Complex medical decision making/goals of care:   -Patient unable to participate in complex medical decision making due to known medical illness of severe dementia.                -Discussed care with patient's daughter-in-law as detailed above in HPI.  Patient appears to be actively transitioning at end-of-life.  Continuing comfort focused care here in the hospital with anticipated hospital death.  -At this time we will discontinue interventions that are no longer focused on comfort such as IV fluids, imaging, or lab work.  Will instead focus on symptom management of pain, dyspnea, and agitation in the setting of end-of-life care.  Will continue Foley for comfort focused care; have discontinued continuous irrigation  -  Code Status: Do not attempt resuscitation (DNR) - Comfort care Prognosis: Hours - Days  # Symptom management Patient is  receiving these palliative interventions for symptom management with an intent to improve quality of life.     -Pain/Dyspnea, acute in the setting of end-of-life care Patient having severe increase if  work of breathing at end of life with also noted periods of apnea as expected.                  -Continue IV morphine to 2-4 mg every 30 minute as needed   -Increase morphine infusion to 3 mg/h continuous infusion and bolus via infusion at 2 mg every 30 minutes.  Continue to adjust based on patient's symptom burden.                  -Anxiety/agitation, in the setting of end-of-life care                               -Continue IV Ativan 1 mg every 4 hours as needed. Continue to adjust based on patient's symptom burden.                                 -Continue IV Haldol 0.5 mg every 4 hours as needed. Continue to adjust based on patient's symptom burden.                   -Secretions, in the setting of end-of-life care                               -Continue IV glycopyrrolate 0.2 mg every 4 hours as needed.  # Discharge Planning: Anticipated Hospital Death  Discussed with: Patient's daughter-in-law, hospitalist, RN  Thank you for allowing the palliative care team to participate in the care [facility].  Alvester Morin, DO Palliative Care Provider PMT # [phone]  If patient remains symptomatic despite maximum doses, please call PMT at [phone] between 0700 and 1900. Outside of these hours, please call attending, as PMT does not have night coverage.

## 2023-05-26 NOTE — Plan of Care (Signed)
 Education provided to family at bedside.   Problem: Education: Goal: Knowledge of the prescribed therapeutic regimen will improve Outcome: Progressing   Problem: Coping: Goal: Ability to identify and develop effective coping behavior will improve Outcome: Progressing   Problem: Clinical Measurements: Goal: Quality of life will improve Outcome: Progressing   Problem: Respiratory: Goal: Verbalizations of increased ease of respirations will increase Outcome: Progressing   Problem: Role Relationship: Goal: Family's ability to cope with current situation will improve Outcome: Progressing Goal: Ability to verbalize concerns, feelings, and thoughts to partner or family member will improve Outcome: Progressing   Problem: Pain Management: Goal: Satisfaction with pain management regimen will improve Outcome: Progressing   Problem: Education: Goal: Knowledge of General Education information will improve Description: Including pain rating scale, medication(s)/side effects and non-pharmacologic comfort measures Outcome: Progressing   Problem: Health Behavior/Discharge Planning: Goal: Ability to manage health-related needs will improve Outcome: Progressing   Problem: Clinical Measurements: Goal: Ability to maintain clinical measurements within normal limits will improve Outcome: Progressing Goal: Will remain free from infection Outcome: Progressing Goal: Diagnostic test results will improve Outcome: Progressing Goal: Respiratory complications will improve Outcome: Progressing Goal: Cardiovascular complication will be avoided Outcome: Progressing   Problem: Activity: Goal: Risk for activity intolerance will decrease Outcome: Progressing   Problem: Nutrition: Goal: Adequate nutrition will be maintained Outcome: Progressing   Problem: Coping: Goal: Level of anxiety will decrease Outcome: Progressing   Problem: Elimination: Goal: Will not experience complications related  to bowel motility Outcome: Progressing Goal: Will not experience complications related to urinary retention Outcome: Progressing   Problem: Pain Managment: Goal: General experience of comfort will improve and/or be controlled Outcome: Progressing   Problem: Safety: Goal: Ability to remain free from injury will improve Outcome: Progressing   Problem: Skin Integrity: Goal: Risk for impaired skin integrity will decrease Outcome: Progressing

## 2023-05-26 NOTE — Progress Notes (Signed)
 Progress Note    Tanner Ferguson   EXB:[id number]  DOB: 1927-03-11  DOA: 05/24/2023     2 PCP: Georgann Housekeeper, MD  Initial CC: Urinary retention  Hospital Course: Tanner Ferguson is a 89 yo male with advanced dementia, PAF, AS s/p mechanical AV 1995, mitral regurg, OSA, CAD, HTN, HLD, essential tremor, prostate cancer s/p brachytherapy.  He presented with urinary retention and required Foley catheter placement in the ER yielding gross hematuria.  Family also reports that he has had noticeably decreased appetite over the past 1 month with associated weight loss as well as increasing agitation and difficulty managing the patient at home.  They feel that his dementia has worsened and are unsure if able to continue caring for him at home.  He had a similar episode of urinary retention with hematuria in March 2024.  This was treated with Foley catheter placement and antibiotic course along with Flomax and finasteride.  INR 3.3 on admission. After Foley catheter placement in the ER, he was initiated on CBI and urology was consulted.  Palliative care also consulted for assistance with GOC discussions. He had further decline after admission and with ongoing goals of care discussions, family elected for transitioning further to comfort care.  His agitation and respiratory distress responded well to morphine and was transitioned to a morphine drip.  Interval History:  Family present bedside this morning.  Remains on morphine drip.  Given dose of morphine push overnight.  Appears comfortable this morning, still with somewhat apneic breathing.  Anticipating inpatient death in next 24-48 hours.   Assessment and Plan: * Acute urinary retention - Similar episode March 2024 resolving with Foley placement and course of antibiotics - Has been compliant on finasteride at home -UA negative for signs of infection but treated with one-time dose of Rocephin empirically -Appreciate urology evaluation; has now been  transitioned off of CBI - Continue Foley for comfort  Atrial fibrillation (HCC) - afib with slow VR; currently denied CP or palpitations but some possible limitations with dementia -Discontinue medications now with transitioning to comfort care  Dementia with behavioral disturbance (HCC) - Family reports he has become more agitated recently and having more difficulty caring for him at home; feel that previous trial of Depakote made him worse as well as interacted with Coumadin -They also endorse that he has progressively lost weight over the past month and has been eating significantly less - palliative care following -Further GOC discussions held 05/25/2023 after he declined further since admission; we are now transitioning to comfort care and in-hospital death expected  [facility] - Seen by Dr. Pamelia Ferguson in Sept 2020 for similar; at that time was felt to be multifactorial in setting of infection, renal function -Discussed bedside with family regarding further workup; given his advanced dementia and overall decline, they are okay with deferring further workup at this time especially as they would not pursue treatment depending on diagnosis - PLTC acutely lower than prior baseline, possible some acute bone marrow suppression from current acute illness - no further workup in setting of comfort care  Stage 3b chronic kidney disease (CKD) (HCC) - patient has history of CKD3b. Baseline creat ~ 1.6 - 1.7, eGFR~ 38   Hypokalemia - repleted  History of prostate cancer - s/p brachytherapy over 10 years ago  Bradycardia - Asymptomatic and known; also see A-fib slow VR  Hypertension - Slightly above goal possibly in setting of pain on admission -Continue comfort care  Essential tremor - Was referred to  neurology previously for consideration of DBS - Given advanced dementia and progressive decline, supportive care for now.  Not a candidate for beta-blocker trial with  bradycardia  Hyperlipidemia - No further mortality benefit with statin use  History of mechanical aortic valve replacement - 1995 -Discontinue Coumadin; no benefit in Lovenox at this time either - Focus is on end-of-life care   Old records reviewed in assessment of this patient  Antimicrobials:   DVT prophylaxis:     Code Status:   Code Status: Do not attempt resuscitation (DNR) - Comfort care  Mobility Assessment (Last 72 Hours)     Mobility Assessment     Row Name 05/26/23 1126 05/25/23 1930 05/25/23 1300       Does patient have an order for bedrest or is patient medically unstable No - Continue assessment No - Continue assessment No - Continue assessment     What is the highest level of mobility based on the progressive mobility assessment? Level 1 (Bedfast) - Unable to balance while sitting on edge of bed Level 1 (Bedfast) - Unable to balance while sitting on edge of bed Level 1 (Bedfast) - Unable to balance while sitting on edge of bed     Is the above level different from baseline mobility prior to current illness? No - Consider discontinuing PT/OT No - Consider discontinuing PT/OT No - Consider discontinuing PT/OT  comfort measures only              Barriers to discharge:  Disposition Plan: In-hospital death Status is: Inpatient  Objective: Blood pressure (!) 87/49, pulse 67, temperature 98.8 F (37.1 C), temperature source Oral, resp. rate 16, SpO2 96%.  Examination:  Physical Exam Constitutional:      General: He is not in acute distress.    Appearance: He is not ill-appearing.     Comments: No longer interactive. Does not arouse to voice to light chest rub. Apneic breathing   HENT:     Head: Normocephalic and atraumatic.     Mouth/Throat:     Mouth: Mucous membranes are dry.  Cardiovascular:     Rate and Rhythm: Normal rate. Rhythm irregular.  Pulmonary:     Effort: Bradypnea present. No respiratory distress.     Breath sounds: Normal breath  sounds.  Abdominal:     General: Bowel sounds are normal. There is no distension.     Palpations: Abdomen is soft.     Tenderness: There is no abdominal tenderness.  Musculoskeletal:        General: Normal range of motion.     Cervical back: Normal range of motion and neck supple.     Right lower leg: Edema present.     Left lower leg: Edema present.  Skin:    General: Skin is warm and dry.  Neurological:     Mental Status: He is alert.     Comments: Noninteractive      Consultants:  Palliative care  Procedures:    Data Reviewed: No results found for this or any previous visit (from the past 24 hours).   I have reviewed pertinent nursing notes, vitals, labs, and images as necessary. I have ordered labwork to follow up on as indicated.  I have reviewed the last notes from staff over past 24 hours. I have discussed patient's care plan and test results with nursing staff, CM/SW, and other staff as appropriate.  Time spent: Greater than 50% of the 55 minute visit was spent in counseling/coordination of care for  the patient as laid out in the A&P.   LOS: 2 days   Lewie Chamber, MD [facility] 05/26/2023, 1:11 PM

## 2023-05-26 NOTE — TOC Initial Note (Signed)
 Transition of Care [facility]) - Initial/Assessment Note    Patient Details  Name: Tanner Ferguson MRN: [id number] Date of Birth: [birth year]  Transition of Care [facility]) CM/SW Contact:    Beckie Busing, RN Phone Number:[phone]  05/26/2023, 3:06 PM  Clinical Narrative:                 TOC acknowledges patient with high risk for readmission. Patient  has currently transitioned to comfort care and in-hospital death expected. There are no TOC needs at this time.         Patient Goals and CMS Choice            Expected Discharge Plan and Services                                              Prior Living Arrangements/Services                       Activities of Daily Living   ADL Screening (condition at time of admission) Independently performs ADLs?: No (pt minimally repsonsive, comfort measures only) Does the patient have a NEW difficulty with bathing/dressing/toileting/self-feeding that is expected to last >3 days?: No Does the patient have a NEW difficulty with getting in/out of bed, walking, or climbing stairs that is expected to last >3 days?: No (pt comfort measures) Does the patient have a NEW difficulty with communication that is expected to last >3 days?: No Is the patient deaf or have difficulty hearing?: No Does the patient have difficulty seeing, even when wearing glasses/contacts?: No Does the patient have difficulty concentrating, remembering, or making decisions?: No  Permission Sought/Granted                  Emotional Assessment              Admission diagnosis:  Gross hematuria [R31.0] Acute urinary retention [R33.8] Pancytopenia (HCC) [D61.818] Bladder outlet obstruction [N32.0] Atrial fibrillation, unspecified type [facility]) [I48.91] Patient Active Problem List   Diagnosis Date Noted   Shortness of breath 05/26/2023   Need for emotional support 05/25/2023   End of life care 05/25/2023   DNR (do not resuscitate) 05/25/2023    High risk medication use 05/25/2023   Medication management 05/25/2023   Acute urinary retention 05/24/2023   Pancytopenia (HCC) 05/24/2023   Hypokalemia 05/24/2023   Palliative care encounter 05/24/2023   Bladder outlet obstruction 05/24/2023   Goals of care, counseling/discussion 05/24/2023   Counseling and coordination of care 05/24/2023   Urinary retention 04/20/2022   History of prostate cancer 04/20/2022   Complicated UTI (urinary tract infection) 04/12/2022   Normocytic anemia 04/12/2022   Supratherapeutic INR 04/12/2022   Hematuria 04/12/2022   Elevated troponin 08/20/2021   Acute maxillary sinusitis 08/20/2021   Asthma exacerbation    OSA on CPAP    Stage 3b chronic kidney disease (CKD) (HCC)    Acute bacterial sinusitis    Chronic cough 08/08/2019   Thrombocytopenia (HCC) 11/13/2018   Hyponatremia 10/20/2018   Collapse with respiratory arrest on examination (HCC) 10/20/2018   Hyperlipidemia    Essential tremor    Hypertension    Dementia with behavioral disturbance (HCC)    Anemia    Cellulitis of finger of right hand 10/19/2018   History of mechanical aortic valve replacement 03/09/2018   Chronic systolic heart failure (HCC) 03/09/2018  Long term (current) use of anticoagulants 03/09/2018   Ischemic cardiomyopathy 03/09/2018   Coronary artery disease    Atrial fibrillation (HCC) 01/25/2018   NSTEMI (non-ST elevated myocardial infarction) (HCC) 11/03/2017   Bradycardia 11/24/2015   Pleural plaque due to asbestos exposure 09/04/2014   Smokeless tobacco use 02/15/2013   Mitral regurgitation 02/14/2013   Inguinal hernia 12/13/2012   PCP:  Georgann Housekeeper, MD Pharmacy:   [facility] [address] [place], [place] - [address] [place] [address] Phone: [phone] Fax: [phone]     Social Drivers of Health (SDOH) Social History: SDOH Screenings   Food Insecurity: Patient Unable To Answer (05/25/2023)  Housing:  Patient Unable To Answer (05/25/2023)  Transportation Needs: Patient Unable To Answer (05/25/2023)  Utilities: Patient Unable To Answer (05/25/2023)  Social Connections: Patient Unable To Answer (05/25/2023)  Tobacco Use: Medium Risk (05/25/2023)   SDOH Interventions:     Readmission Risk Interventions    04/18/2022    9:10 AM  Readmission Risk Prevention Plan  Transportation Screening Complete  PCP or Specialist Appt within 3-5 Days Complete  HRI or Home Care Consult Complete  Social Work Consult for Recovery Care Planning/Counseling Complete  Palliative Care Screening Not Applicable  Medication Review [employer]) Complete

## 2023-05-27 DIAGNOSIS — R627 Adult failure to thrive: Secondary | ICD-10-CM

## 2023-05-27 DIAGNOSIS — F03918 Unspecified dementia, unspecified severity, with other behavioral disturbance: Secondary | ICD-10-CM | POA: Diagnosis not present

## 2023-05-27 DIAGNOSIS — R338 Other retention of urine: Secondary | ICD-10-CM | POA: Diagnosis not present

## 2023-05-27 DIAGNOSIS — I480 Paroxysmal atrial fibrillation: Secondary | ICD-10-CM | POA: Diagnosis not present

## 2023-05-27 DIAGNOSIS — Z515 Encounter for palliative care: Secondary | ICD-10-CM | POA: Diagnosis not present

## 2023-05-28 DIAGNOSIS — R627 Adult failure to thrive: Secondary | ICD-10-CM

## 2023-05-31 NOTE — Progress Notes (Signed)
 Daily Progress Note   Patient Name: Tanner Ferguson       Date: 05/02/2023 DOB: May 29, 1927  Age: 89 y.o. MRN#: [id number] Attending Physician: Lewie Chamber, MD Primary Care Physician: Georgann Housekeeper, MD Admit Date: 05/24/2023 Length of Stay: 3 days  Reason for Consultation/Follow-up: Establishing goals of care and symptom management  Subjective:   CC: Patient unresponsive.  Spoke with family at bedside.  Following up regarding complex medical decision making.  Subjective:  Reviewed EMR prior to presenting to bedside.  Patient continues to receive IV morphine at 3 mg/h continuous infusion.  Vitals this morning noted to show temperature of 99.5 F, blood pressure of 93/45. Discussed care with hospitalist for updates.  Presented to bedside to see patient.  Patient remained unresponsive laying in bed.  Patient's lips noted to have blue color, paleness to skin.  Patient still having prolonged periods of apnea. Discussed care with patient's son and daughter-in-law at bedside.  Spent time providing emotional support via active listening.  This included life review of the patient and family's memories of this.  Discussed continuing comfort focused care at this time with anticipated hospital death.  All questions answered at that time.  Noted palliative medicine team and continue to follow with patient's medical journey.  Objective:   Vital Signs:  BP (!) 93/45 (BP Location: Left Arm)   Pulse 65   Temp 99.5 F (37.5 C) (Oral)   Resp 14   SpO2 90%   Physical Exam: General: Unresponsive, ill-appearing, frail HENT:  dry mucous membranes Cardiovascular: Irregular rate and rhythm, pulse weak Respiratory: Slightly increased work of breathing noted with use of accessory muscles Skin: Pale Neuro: Unresponsive  Imaging:  I personally reviewed recent imaging.   Assessment & Plan:   Assessment: Patient is a 89 year old male with a past medical history of dementia, essential tremor, prostate  cancer status post brachytherapy, paroxysmal A-fib, aortic stenosis status post mechanical AV in 1995, OSA, CAD, hypertension, and hyperlipidemia who presented to the ER on 05/24/2023 for management of worsening agitation. Family had reported that he had had decreased appetite, associated weight loss, and increasing agitation at home over the past month. Upon presentation, patient noted to have urinary retention which required Foley catheter placement and yielded gross hematuria. Urology consulted for recommendations as well. Palliative medicine team consulted to assist with complex medical decision making.   Recommendations/Plan: # Complex medical decision making/goals of care:   -Patient unable to participate in complex medical decision making due to known medical illness of severe dementia.                -Discussed care with patient's son and daughter-in-law as detailed above in HPI.  Patient appears to be at end-of-life.  Continuing comfort focused care here in the hospital with anticipated hospital death.  -Have discontinued interventions that are no longer focused on comfort such as IV fluids, imaging, or lab work.  Will instead focus on symptom management of pain, dyspnea, and agitation in the setting of end-of-life care.  Will continue Foley for comfort focused care; have discontinued continuous irrigation  -  Code Status: Do not attempt resuscitation (DNR) - Comfort care Prognosis: Hours - Days  # Symptom management Patient is receiving these palliative interventions for symptom management with an intent to improve quality of life.     -Pain/Dyspnea, acute in the setting of end-of-life care Patient having severe increase if work of breathing at end of life with also noted periods of apnea as expected.                  -  Continue IV morphine to 2-4 mg every 30 minute as needed   -Continue morphine infusion to 3 mg/h continuous infusion and bolus via infusion at 2 mg every 30 minutes.  Continue  to adjust based on patient's symptom burden.                  -Anxiety/agitation, in the setting of end-of-life care                               -Continue IV Ativan 1 mg every 4 hours as needed. Continue to adjust based on patient's symptom burden.                                 -Continue IV Haldol 0.5 mg every 4 hours as needed. Continue to adjust based on patient's symptom burden.                   -Secretions, in the setting of end-of-life care                               -Continue IV glycopyrrolate 0.2 mg every 4 hours as needed.  # Discharge Planning: Anticipated Hospital Death  Discussed with: Patient's daughter-in-law, hospitalist, RN  Thank you for allowing the palliative care team to participate in the care [facility].  Alvester Morin, DO Palliative Care Provider PMT # [phone]  If patient remains symptomatic despite maximum doses, please call PMT at [phone] between 0700 and 1900. Outside of these hours, please call attending, as PMT does not have night coverage.  Personally spent 35 minutes in patient care including extensive chart review (labs, imaging, progress/consult notes, vital signs), medically appropraite exam, discussed with treatment team, education to patient, family, and staff, documenting clinical information, medication review and management, coordination of care, and available advanced directive documents.

## 2023-05-31 NOTE — Progress Notes (Addendum)
 Wasted 30 ml of morphine infusion in stericycle , witnessed by Laural Roes ,RN

## 2023-05-31 NOTE — Progress Notes (Signed)
 Progress Note    Tanner Ferguson   WGN:[id number]  DOB: Dec 03, 1927  DOA: 05/24/2023     3 PCP: Georgann Housekeeper, MD  Initial CC: Urinary retention  Hospital Course: Tanner Ferguson is a 89 yo male with advanced dementia, PAF, AS s/p mechanical AV 1995, mitral regurg, OSA, CAD, HTN, HLD, essential tremor, prostate cancer s/p brachytherapy.  He presented with urinary retention and required Foley catheter placement in the ER yielding gross hematuria.  Family also reports that he has had noticeably decreased appetite over the past 1 month with associated weight loss as well as increasing agitation and difficulty managing the patient at home.  They feel that his dementia has worsened and are unsure if able to continue caring for him at home.  He had a similar episode of urinary retention with hematuria in March 2024.  This was treated with Foley catheter placement and antibiotic course along with Flomax and finasteride.  INR 3.3 on admission. After Foley catheter placement in the ER, he was initiated on CBI and urology was consulted.  Palliative care also consulted for assistance with GOC discussions. He had further decline after admission and with ongoing goals of care discussions, family elected for transitioning further to comfort care.  His agitation and respiratory distress responded well to morphine and was transitioned to a morphine drip.  Interval History:  Family present bedside this morning.  Remains on morphine drip. None needed overnight. He seems still comfortable and still has apneic breathing. Still anticipating inpatient death in next 24-48 hours.   Assessment and Plan: * Acute urinary retention - Similar episode March 2024 resolving with Foley placement and course of antibiotics - Has been compliant on finasteride at home -UA negative for signs of infection but treated with one-time dose of Rocephin empirically -Appreciate urology evaluation; has now been transitioned off of CBI -  Continue Foley for comfort  Atrial fibrillation (HCC) - afib with slow VR; currently denied CP or palpitations but some possible limitations with dementia -Discontinue medications now with transitioning to comfort care  Dementia with behavioral disturbance (HCC) - Family reports he has become more agitated recently and having more difficulty caring for him at home; feel that previous trial of Depakote made him worse as well as interacted with Coumadin -They also endorse that he has progressively lost weight over the past month and has been eating significantly less - palliative care following -Further GOC discussions held 05/25/2023 after he declined further since admission; we are now transitioning to comfort care and in-hospital death expected  Pancytopenia [facility]) - Seen by Dr. Pamelia Hoit in Sept 2020 for similar; at that time was felt to be multifactorial in setting of infection, renal function -Discussed bedside with family regarding further workup; given his advanced dementia and overall decline, they are okay with deferring further workup at this time especially as they would not pursue treatment depending on diagnosis - PLTC acutely lower than prior baseline, possible some acute bone marrow suppression from current acute illness - no further workup in setting of comfort care  Stage 3b chronic kidney disease (CKD) (HCC) - patient has history of CKD3b. Baseline creat ~ 1.6 - 1.7, eGFR~ 38   Hypokalemia - repleted  History of prostate cancer - s/p brachytherapy over 10 years ago  Bradycardia - Asymptomatic and known; also see A-fib slow VR  Hypertension - Slightly above goal possibly in setting of pain on admission -Continue comfort care  Essential tremor - Was referred to neurology previously for consideration of  DBS - Given advanced dementia and progressive decline, supportive care for now.  Not a candidate for beta-blocker trial with bradycardia  Hyperlipidemia - No further  mortality benefit with statin use  History of mechanical aortic valve replacement - 1995 -Discontinue Coumadin; no benefit in Lovenox at this time either - Focus is on end-of-life care   Old records reviewed in assessment of this patient  Antimicrobials:   DVT prophylaxis:     Code Status:   Code Status: Do not attempt resuscitation (DNR) - Comfort care  Mobility Assessment (Last 72 Hours)     Mobility Assessment     Row Name 05/15/2023 1100 05/26/23 1947 05/26/23 1126 05/25/23 1930 05/25/23 1300   Does patient have an order for bedrest or is patient medically unstable No - Continue assessment No - Continue assessment No - Continue assessment No - Continue assessment No - Continue assessment   What is the highest level of mobility based on the progressive mobility assessment? Level 1 (Bedfast) - Unable to balance while sitting on edge of bed Level 1 (Bedfast) - Unable to balance while sitting on edge of bed Level 1 (Bedfast) - Unable to balance while sitting on edge of bed Level 1 (Bedfast) - Unable to balance while sitting on edge of bed Level 1 (Bedfast) - Unable to balance while sitting on edge of bed   Is the above level different from baseline mobility prior to current illness? No - Consider discontinuing PT/OT No - Consider discontinuing PT/OT No - Consider discontinuing PT/OT No - Consider discontinuing PT/OT No - Consider discontinuing PT/OT  comfort measures only            Barriers to discharge:  Disposition Plan: In-hospital death Status is: Inpatient  Objective: Blood pressure (!) 93/45, pulse 65, temperature 99.5 F (37.5 C), temperature source Oral, resp. rate 14, SpO2 90%.  Examination:  Physical Exam Constitutional:      General: He is not in acute distress.    Appearance: He is not ill-appearing.     Comments: No longer interactive. Does not arouse to voice to light chest rub. Apneic breathing   HENT:     Head: Normocephalic and atraumatic.      Mouth/Throat:     Mouth: Mucous membranes are dry.  Cardiovascular:     Rate and Rhythm: Normal rate. Rhythm irregular.  Pulmonary:     Effort: Bradypnea present. No respiratory distress.     Breath sounds: Normal breath sounds.  Abdominal:     General: Bowel sounds are normal. There is no distension.     Palpations: Abdomen is soft.     Tenderness: There is no abdominal tenderness.  Musculoskeletal:        General: Normal range of motion.     Cervical back: Normal range of motion and neck supple.     Right lower leg: Edema present.     Left lower leg: Edema present.  Skin:    General: Skin is warm and dry.  Neurological:     Mental Status: He is alert.     Comments: Noninteractive      Consultants:  Palliative care  Procedures:    Data Reviewed: No results found for this or any previous visit (from the past 24 hours).   I have reviewed pertinent nursing notes, vitals, labs, and images as necessary. I have ordered labwork to follow up on as indicated.  I have reviewed the last notes from staff over past 24 hours. I have discussed  patient's care plan and test results with nursing staff, CM/SW, and other staff as appropriate.  Time spent: Greater than 50% of the 55 minute visit was spent in counseling/coordination of care for the patient as laid out in the A&P.   LOS: 3 days   Lewie Chamber, MD [facility] 05/18/2023, 1:40 PM

## 2023-05-31 NOTE — Plan of Care (Signed)
  Problem: Education: Goal: Knowledge of the prescribed therapeutic regimen will improve 05/10/2023 0527 by Florina Ou, RN Outcome: Progressing 05/06/2023 0526 by Florina Ou, RN Outcome: Progressing   Problem: Coping: Goal: Ability to identify and develop effective coping behavior will improve 05/20/2023 0527 by Florina Ou, RN Outcome: Progressing 05/22/2023 0526 by Florina Ou, RN Outcome: Progressing   Problem: Clinical Measurements: Goal: Quality of life will improve 05/17/2023 0527 by Florina Ou, RN Outcome: Progressing 05/11/2023 0526 by Florina Ou, RN Outcome: Progressing   Problem: Respiratory: Goal: Verbalizations of increased ease of respirations will increase 05/04/2023 0527 by Florina Ou, RN Outcome: Progressing 05/18/2023 0526 by Florina Ou, RN Outcome: Progressing   Problem: Role Relationship: Goal: Family's ability to cope with current situation will improve 05/14/2023 0527 by Florina Ou, RN Outcome: Progressing 05/02/2023 0526 by Florina Ou, RN Outcome: Progressing Goal: Ability to verbalize concerns, feelings, and thoughts to partner or family member will improve 05/09/2023 0527 by Florina Ou, RN Outcome: Progressing 05/17/2023 0526 by Florina Ou, RN Outcome: Progressing   Problem: Pain Management: Goal: Satisfaction with pain management regimen will improve 05/11/2023 0527 by Florina Ou, RN Outcome: Progressing 05/29/2023 0526 by Florina Ou, RN Outcome: Progressing   Problem: Education: Goal: Knowledge of General Education information will improve Description: Including pain rating scale, medication(s)/side effects and non-pharmacologic comfort measures Outcome: Progressing   Problem: Health Behavior/Discharge Planning: Goal: Ability to manage health-related needs will improve Outcome: Progressing   Problem: Clinical Measurements: Goal: Ability to maintain clinical  measurements within normal limits will improve Outcome: Progressing Goal: Will remain free from infection Outcome: Progressing Goal: Diagnostic test results will improve Outcome: Progressing Goal: Respiratory complications will improve Outcome: Progressing Goal: Cardiovascular complication will be avoided Outcome: Progressing   Problem: Activity: Goal: Risk for activity intolerance will decrease Outcome: Progressing   Problem: Nutrition: Goal: Adequate nutrition will be maintained Outcome: Progressing   Problem: Coping: Goal: Level of anxiety will decrease 05/30/2023 0527 by Florina Ou, RN Outcome: Progressing 05/07/2023 0526 by Florina Ou, RN Outcome: Progressing   Problem: Elimination: Goal: Will not experience complications related to bowel motility Outcome: Progressing Goal: Will not experience complications related to urinary retention Outcome: Progressing   Problem: Pain Managment: Goal: General experience of comfort will improve and/or be controlled 05/30/2023 0527 by Florina Ou, RN Outcome: Progressing 05/12/2023 0526 by Florina Ou, RN Outcome: Progressing   Problem: Safety: Goal: Ability to remain free from injury will improve 05/28/2023 0527 by Florina Ou, RN Outcome: Progressing 05/07/2023 0526 by Florina Ou, RN Outcome: Progressing   Problem: Skin Integrity: Goal: Risk for impaired skin integrity will decrease Outcome: Progressing

## 2023-05-31 NOTE — Progress Notes (Addendum)
 Patient expired 1555. RN Hilton Cork and RN Jerene Dilling pronounced death. MD Cordelia Pen notified. Family at the bedside. Wasted 30ml of morphine infusion with RN Jilda Panda.

## 2023-05-31 NOTE — Progress Notes (Signed)
 I provided spiritual and emotional support to Tanner Ferguson and his son and daughter-in-law through listening, shared experience and prayer.

## 2023-05-31 NOTE — Discharge Summary (Signed)
 Death Summary  Tanner Ferguson ZOX:[id number] DOB: [birth year] DOA:   PCP: Georgann Housekeeper, MD  Admit date: May 27, 2023 Date of Death: May 30, 2023 Time of Death: 1553-06-07 Notification: Family notified of death   History of present illness:  Tanner Ferguson is a 89 yo male with advanced dementia, PAF, AS s/p mechanical AV 1995, mitral regurg, OSA, CAD, HTN, HLD, essential tremor, prostate cancer s/p brachytherapy.  He presented with urinary retention and required Foley catheter placement in the ER yielding gross hematuria.  Family also reports that he has had noticeably decreased appetite over the past 1 month with associated weight loss as well as increasing agitation and difficulty managing the patient at home.  They feel that his dementia has worsened and are unsure if able to continue caring for him at home.  He had a similar episode of urinary retention with hematuria in March 2024.  This was treated with Foley catheter placement and antibiotic course along with Flomax and finasteride.  INR 3.3 on admission. After Foley catheter placement in the ER, he was initiated on CBI and urology was consulted.  Palliative care also consulted for assistance with GOC discussions. He had further decline after admission and with ongoing goals of care discussions, family elected for transitioning further to comfort care.  His agitation and respiratory distress responded well to morphine and was transitioned to a morphine drip. He passed away on 05/30/2023 at 1555 with family present.   Final Diagnoses:  Advanced dementia Failure to thrive in adult   The results of significant diagnostics from this hospitalization (including imaging, microbiology, ancillary and laboratory) are listed below for reference.    Significant Diagnostic Studies: US PELVIS LIMITED (TRANSABDOMINAL ONLY) Result Date:  CLINICAL DATA:  History of BPH with urinary tract obstruction EXAM: LIMITED ULTRASOUND OF PELVIS TECHNIQUE:  Limited transabdominal ultrasound examination of the pelvis was performed. COMPARISON:  04/11/2022 FINDINGS: The urinary bladder has a normal appearance for the degree of distention. Foley catheter is noted in place. IMPRESSION: Bladder is within normal limits with Foley catheter in place. Electronically Signed   By: Alcide Clever M.D.   On:  19:03    Microbiology: No results found for this or any previous visit (from the past 240 hours).   Labs: Basic Metabolic Panel: Recent Labs  Lab  1137 05/25/23 0533  NA 139 138  K 2.8* 3.2*  CL 103 106  CO2 27 19*  GLUCOSE 92 129*  BUN 24* 27*  CREATININE 1.88* 2.11*  CALCIUM 8.9 8.7*  MG  --  2.0   Liver Function Tests: No results for input(s): "AST", "ALT", "ALKPHOS", "BILITOT", "PROT", "ALBUMIN" in the last 168 hours. No results for input(s): "LIPASE", "AMYLASE" in the last 168 hours. No results for input(s): "AMMONIA" in the last 168 hours. CBC: Recent Labs  Lab May 27, 2023 1137 05/25/23 0533  WBC 1.9* 5.5  NEUTROABS  --  4.9  HGB 11.4* 11.7*  HCT 36.2* 38.3*  MCV 92.8 94.8  PLT 55* 58*   Cardiac Enzymes: No results for input(s): "CKTOTAL", "CKMB", "CKMBINDEX", "TROPONINI" in the last 168 hours. D-Dimer No results for input(s): "DDIMER" in the last 72 hours. BNP: Invalid input(s): "POCBNP" CBG: No results for input(s): "GLUCAP" in the last 168 hours. Anemia work up No results for input(s): "VITAMINB12", "FOLATE", "FERRITIN", "TIBC", "IRON", "RETICCTPCT" in the last 72 hours. Urinalysis    Component Value Date/Time   COLORURINE YELLOW  1325   APPEARANCEUR HAZY (A)  1325   APPEARANCEUR Clear 05/20/2022 1048  LABSPEC 1.009 05/24/2023 1325   PHURINE 7.0 05/24/2023 1325   GLUCOSEU NEGATIVE 05/24/2023 1325   HGBUR MODERATE (A) 05/24/2023 1325   BILIRUBINUR NEGATIVE 05/24/2023 1325   BILIRUBINUR Negative 05/20/2022 1048   KETONESUR NEGATIVE 05/24/2023 1325   PROTEINUR 100 (A)  05/24/2023 1325   UROBILINOGEN 1.0 04/28/2022 0000   NITRITE NEGATIVE 05/24/2023 1325   LEUKOCYTESUR NEGATIVE 05/24/2023 1325   Sepsis Labs Recent Labs  Lab 05/24/23 1137 05/25/23 0533  WBC 1.9* 5.5     SIGNED:  Lewie Chamber, MD  [facility] 05/28/2023, 12:52 PM

## 2023-05-31 DEATH — deceased

## 2023-06-17 ENCOUNTER — Ambulatory Visit (HOSPITAL_BASED_OUTPATIENT_CLINIC_OR_DEPARTMENT_OTHER): Payer: Medicare Other | Admitting: Cardiology

## 2023-08-02 IMAGING — DX DG CHEST 1V PORT
1 series · 1 of 1 positions shown · non-contrast
Comparison: Chest CT dated 06/13/2020.

CLINICAL DATA: Cough.

EXAM:
PORTABLE CHEST 1 VIEW

[chest ap]
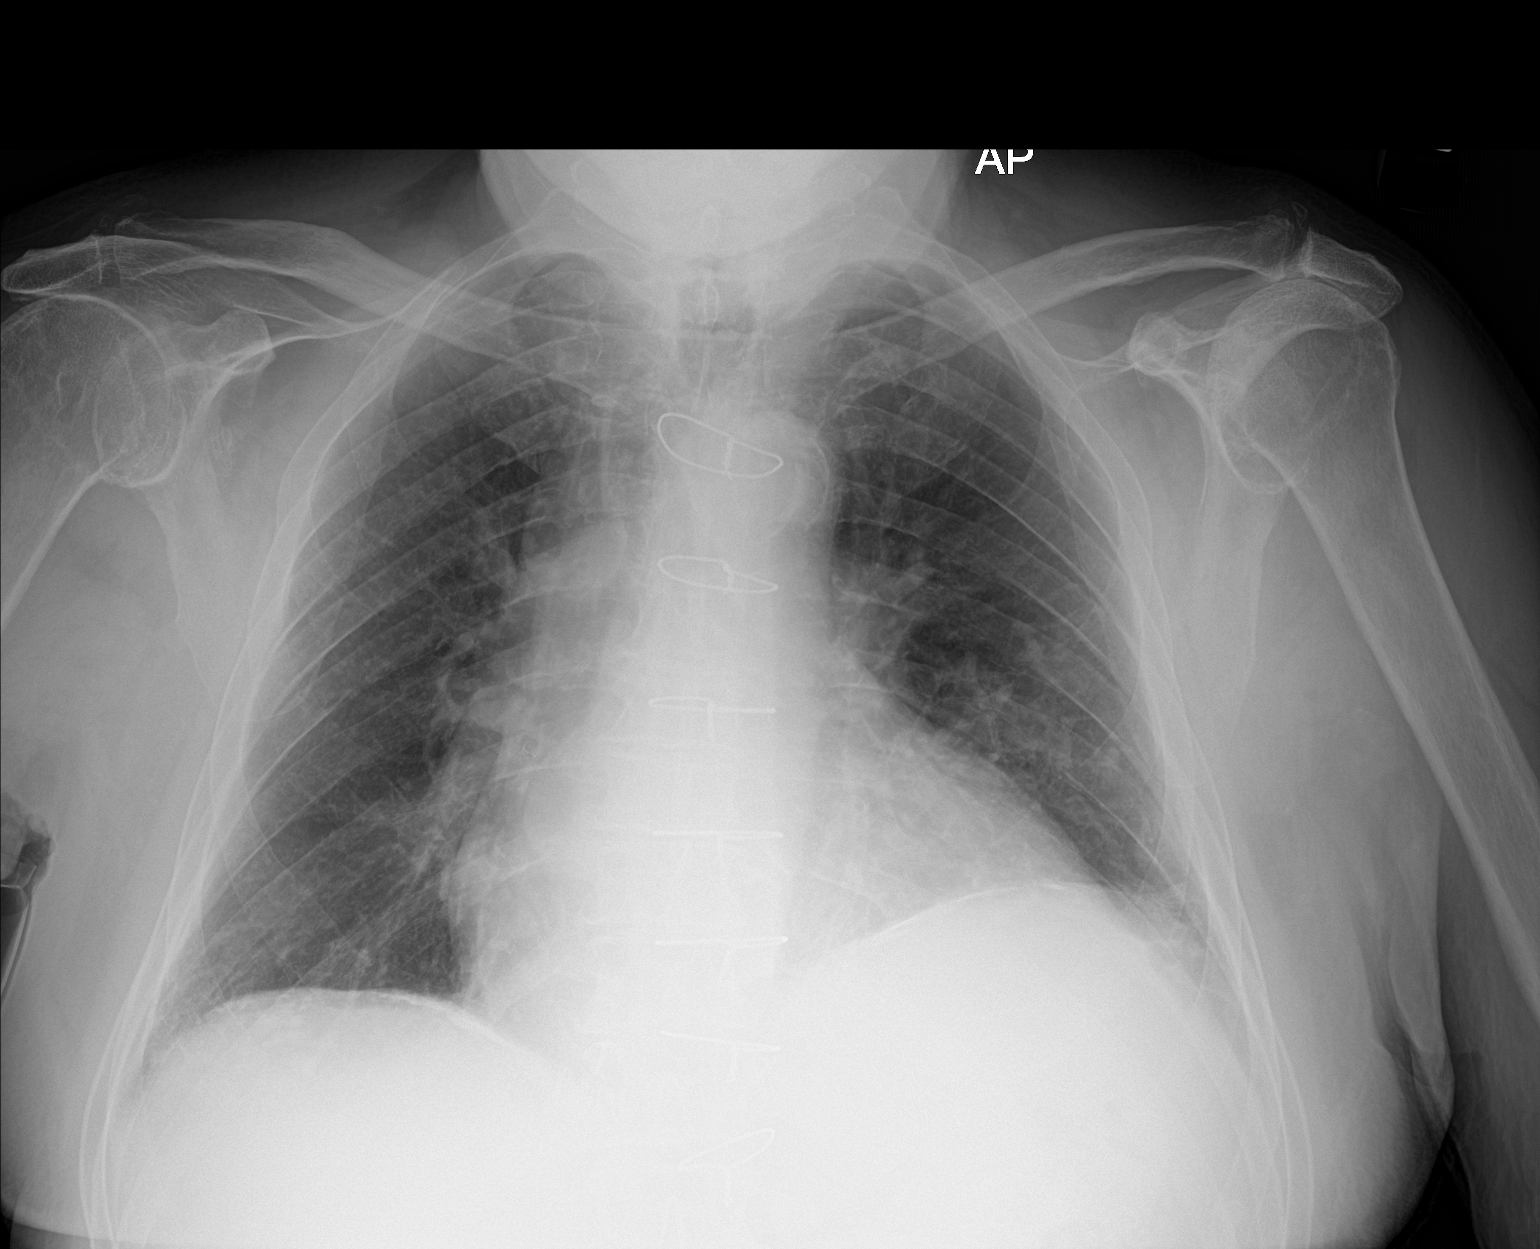

[1 of 1 positions shown; findings below may reference images not displayed]

FINDINGS: Minimal bibasilar atelectasis/scarring. No focal consolidation,
pleural effusion, pneumothorax. There is mild cardiomegaly. Median
sternotomy wires. No acute osseous pathology.
IMPRESSION: No active cardiopulmonary disease.

## 2024-02-09 IMAGING — CR DG CHEST 2V
2 series · 2 of 2 positions shown · non-contrast
Comparison: April 10, 2021.

CLINICAL DATA: Multifocal pneumonia.

EXAM:
CHEST - 2 VIEW

[w chest lat]
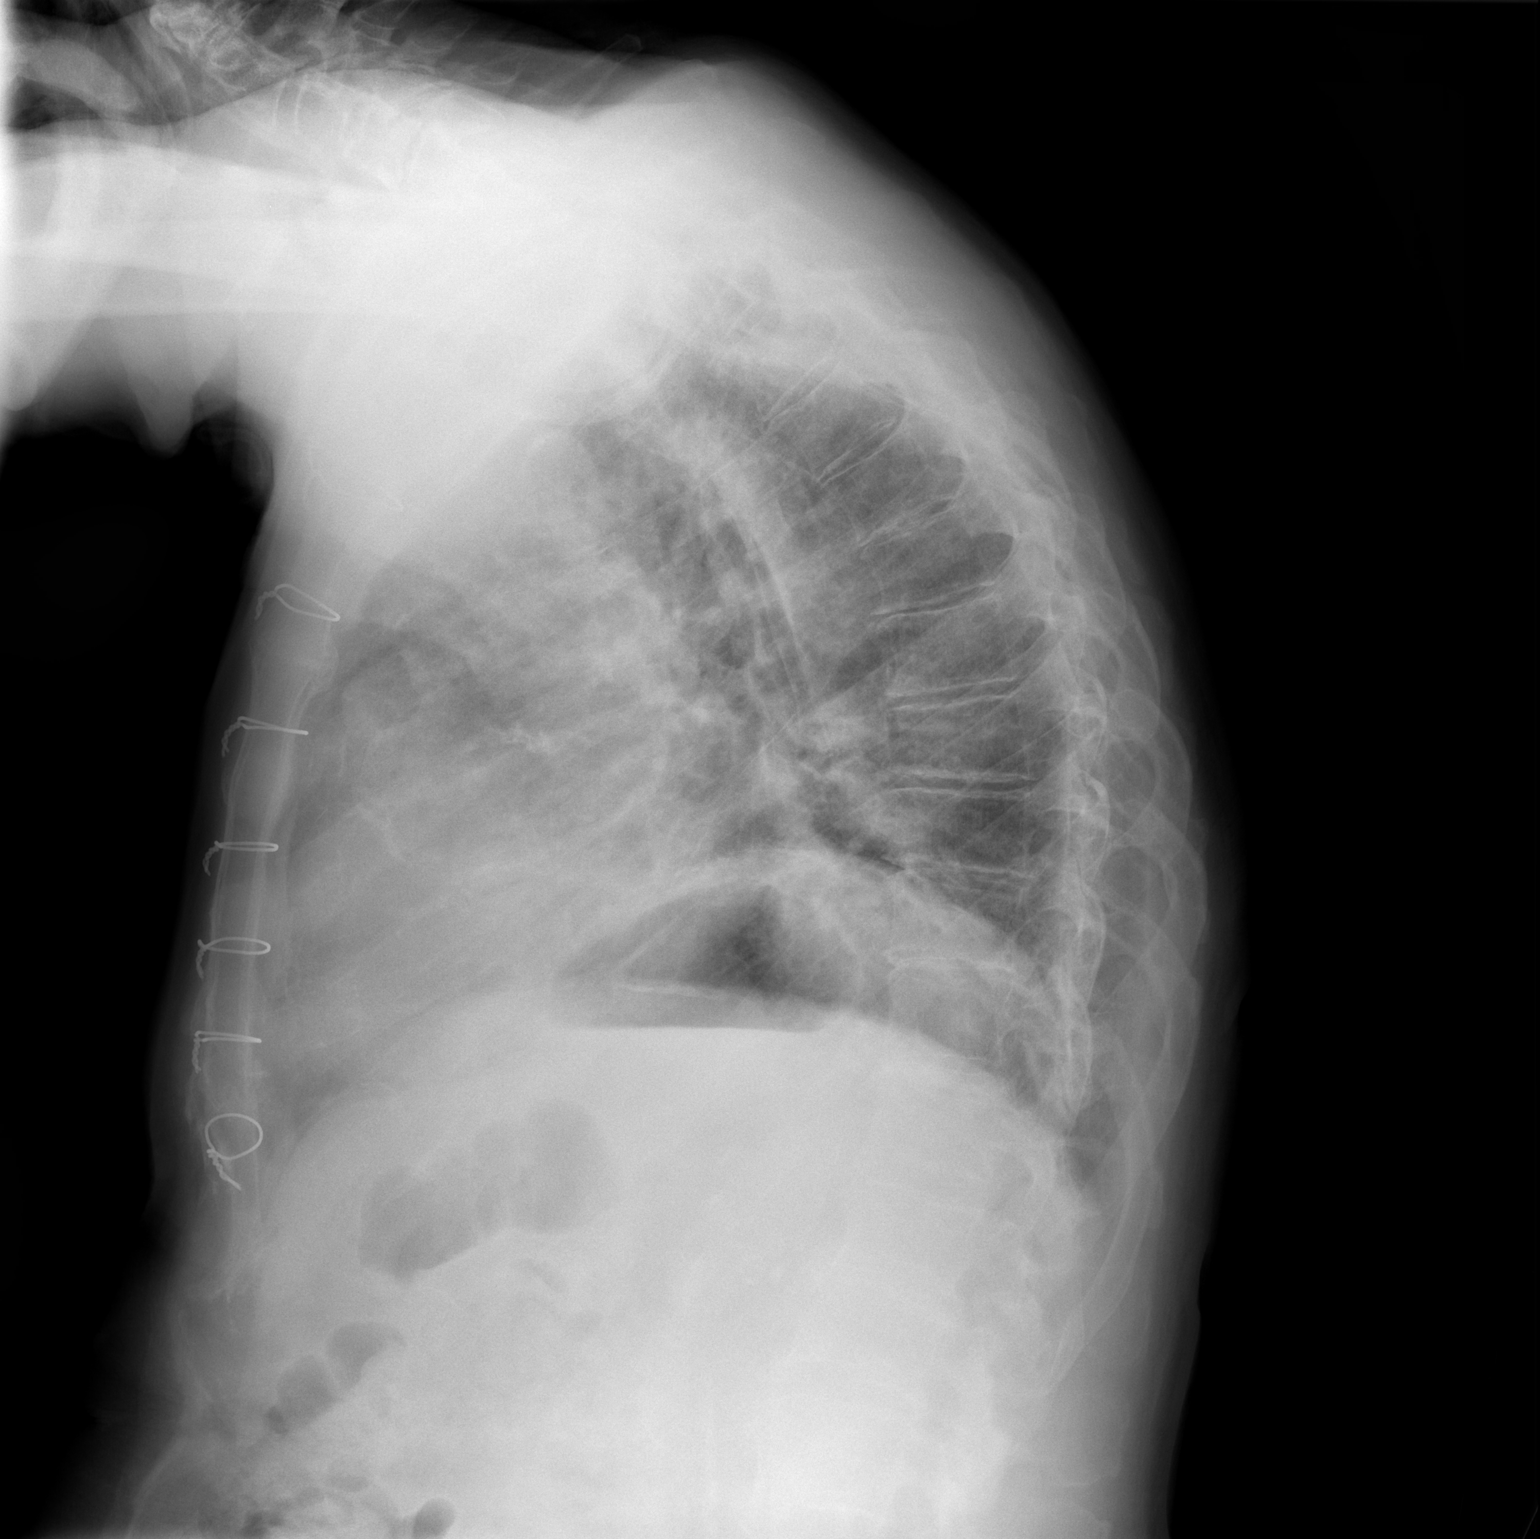

[w chest ap *]
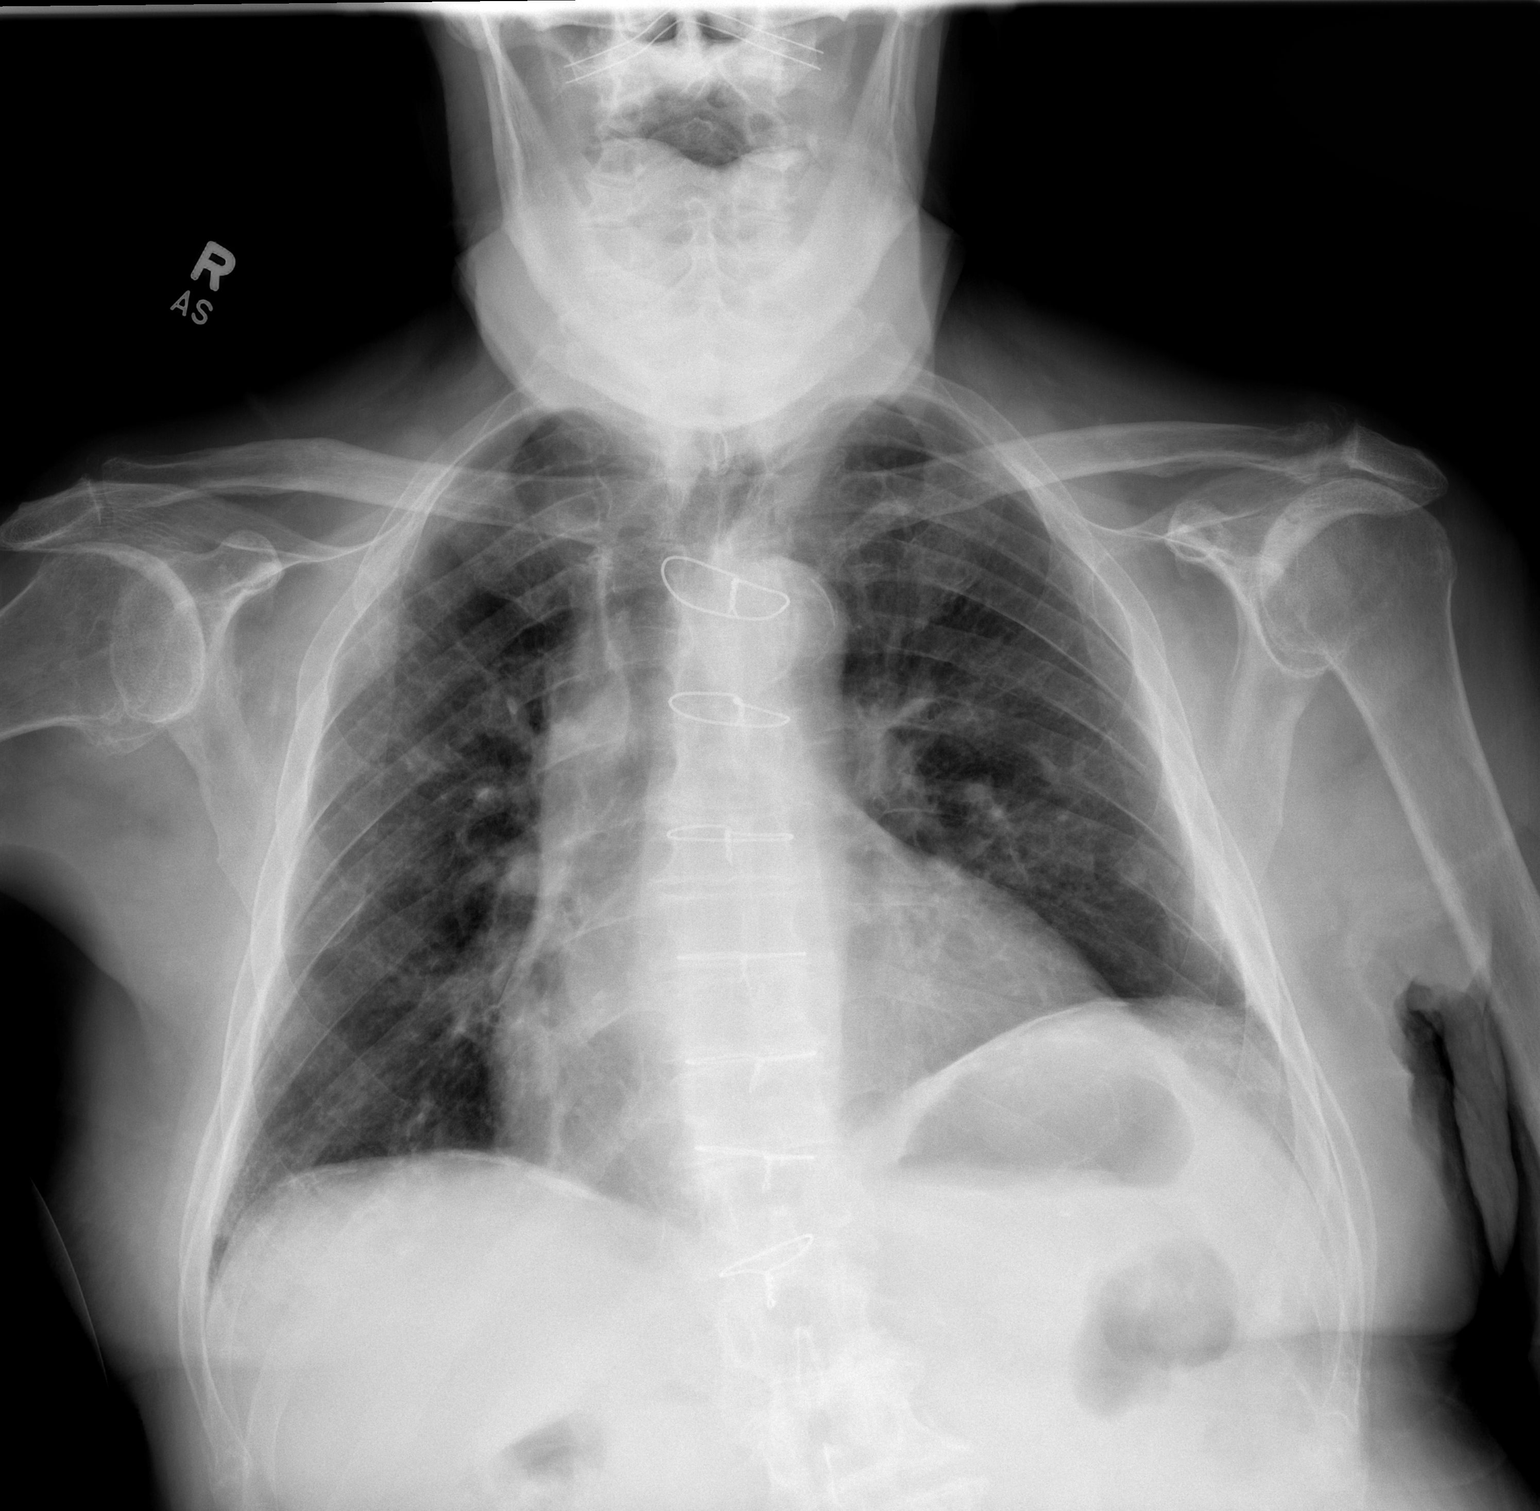

[2 of 2 positions shown; findings below may reference images not displayed]

FINDINGS: Stable cardiomediastinal silhouette. Sternotomy wires are noted.
Both lungs are clear. The visualized skeletal structures are
unremarkable.
IMPRESSION: No active cardiopulmonary disease.

## 2024-06-02 IMAGING — CT CT HEAD W/O CM
3 series · 14 of 47 positions shown, 16 images · non-contrast
Comparison: None Available.

CLINICAL DATA: Status post fall.



[Series 2: head wo · axial · 0.49mm/px · z∈[+1190,+1330]mm · 8 of 34 slices shown, 10 images]
[im 3/34  brain]
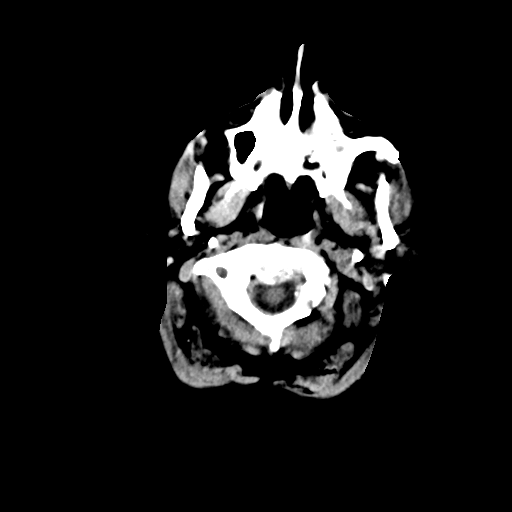
[im 3/34  bone]
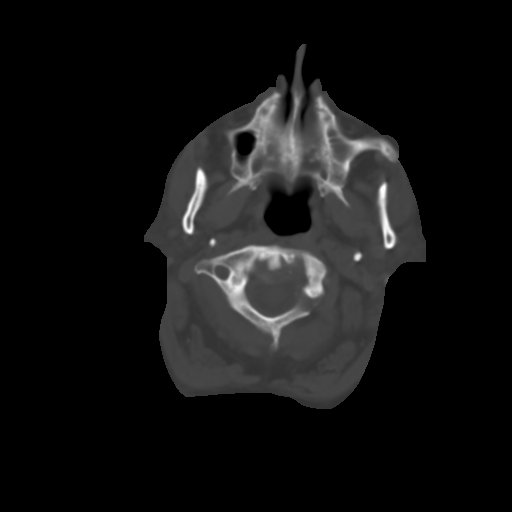
[im 7/34  brain]
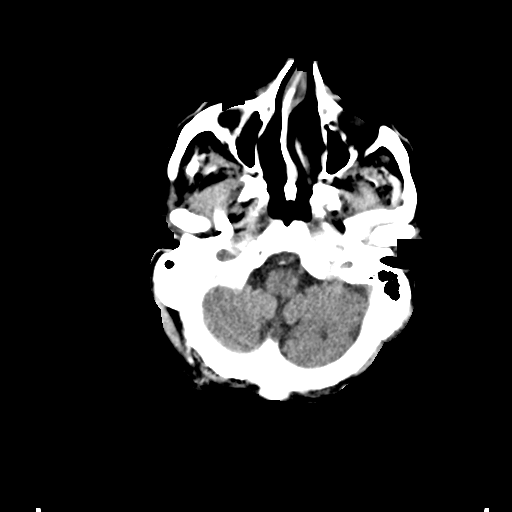
[im 11/34  brain]
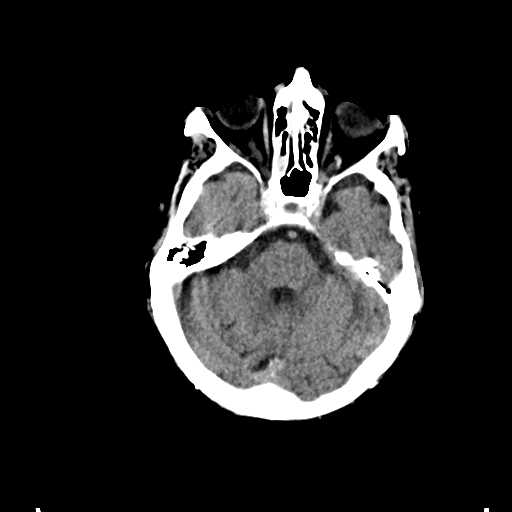
[im 15/34  brain]
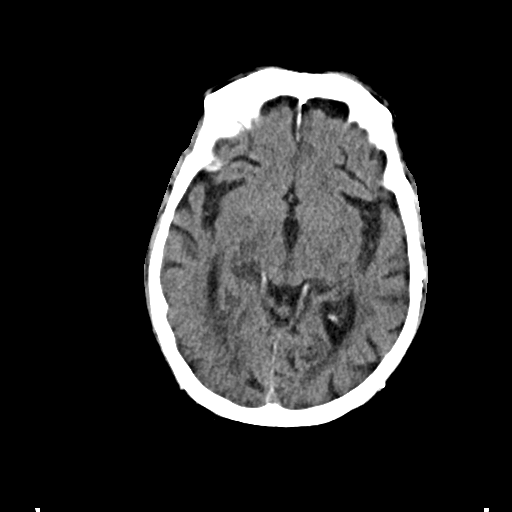
[im 19/34  brain]
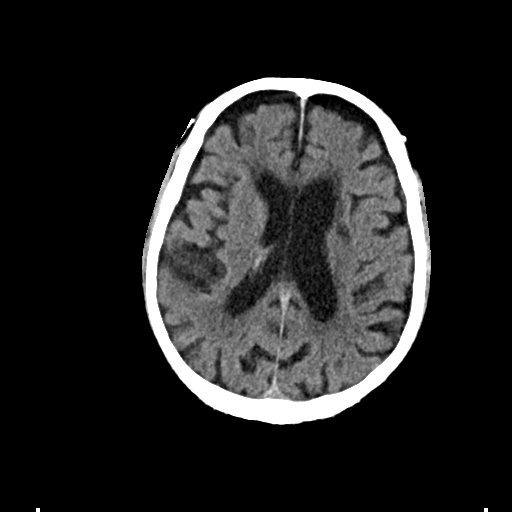
[im 19/34  bone]
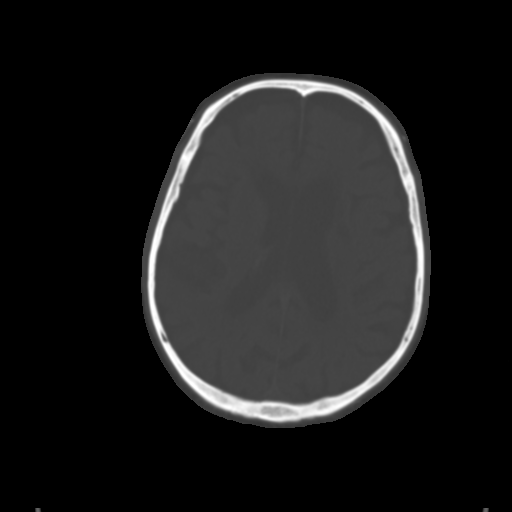
[im 23/34  brain]
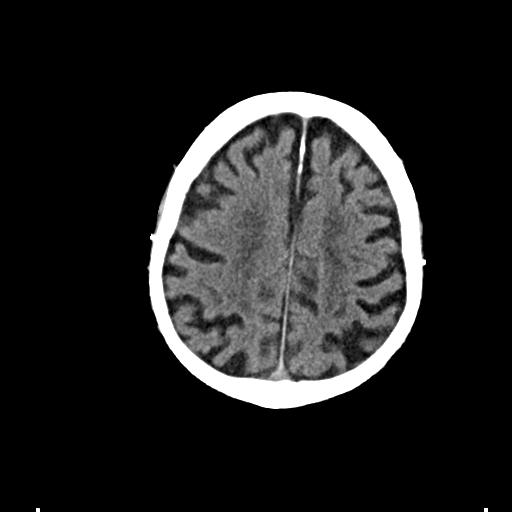
[im 27/34  brain]
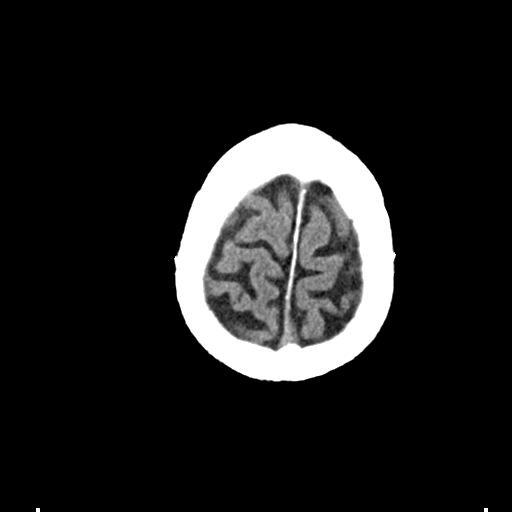
[im 31/34  brain]
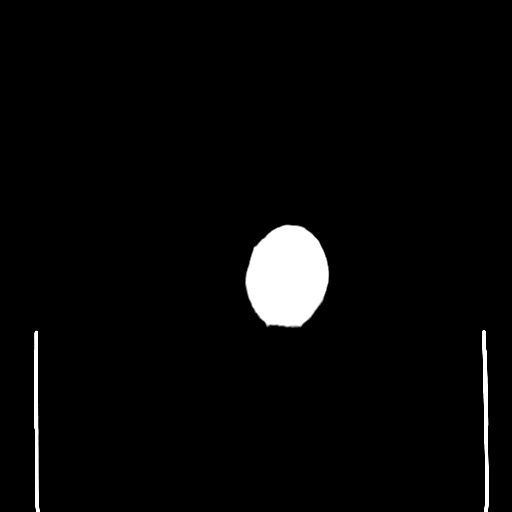

[Series 4: coronal soft · coronal · 0.33mm/px · 3 of 67 slices shown]
[im 23/67  brain]
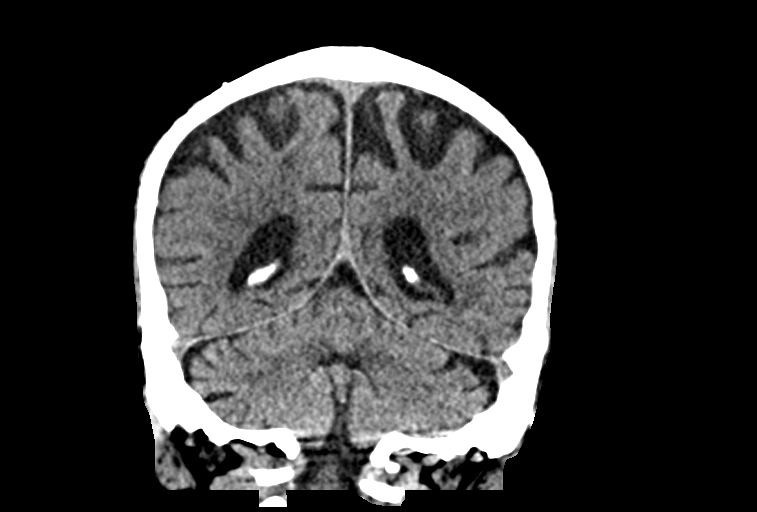
[im 30/67  brain]
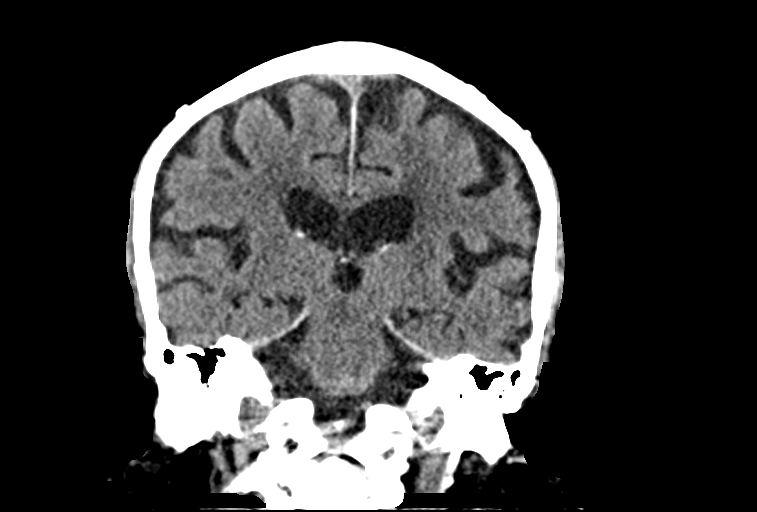
[im 37/67  brain]
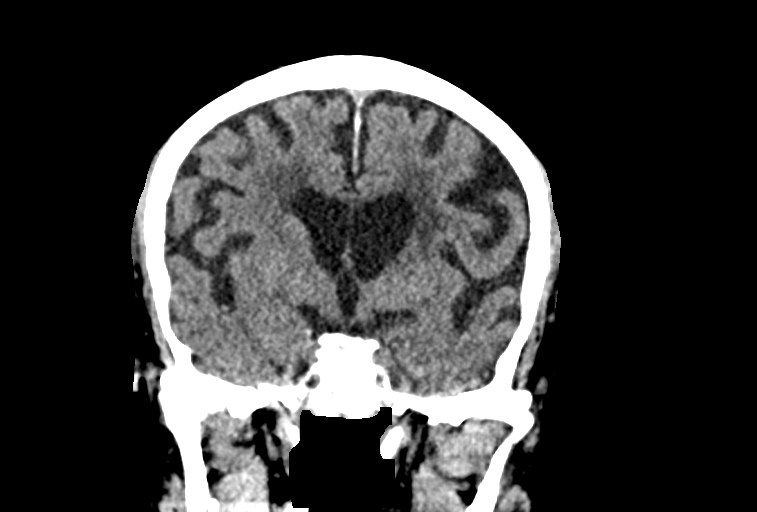

[Series 5: sag soft · sagittal · 0.33mm/px · 3 of 63 slices shown]
[im 21/63  brain]
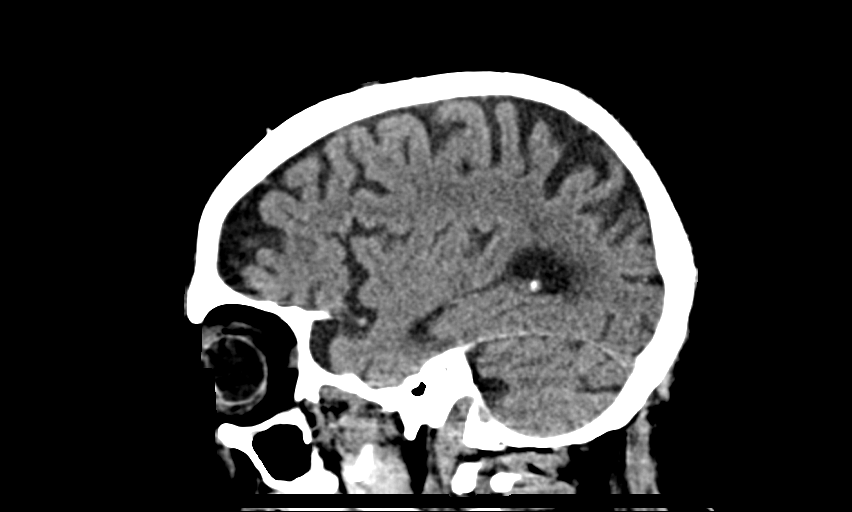
[im 32/63  brain]
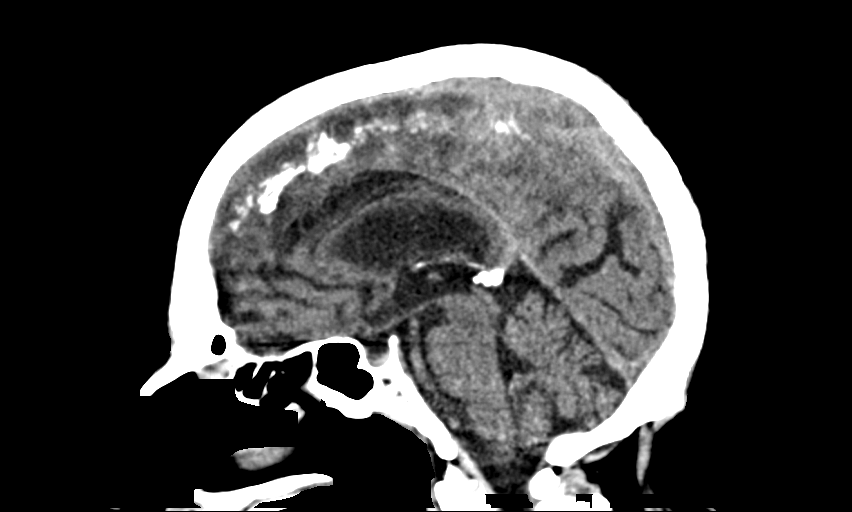
[im 42/63  brain]
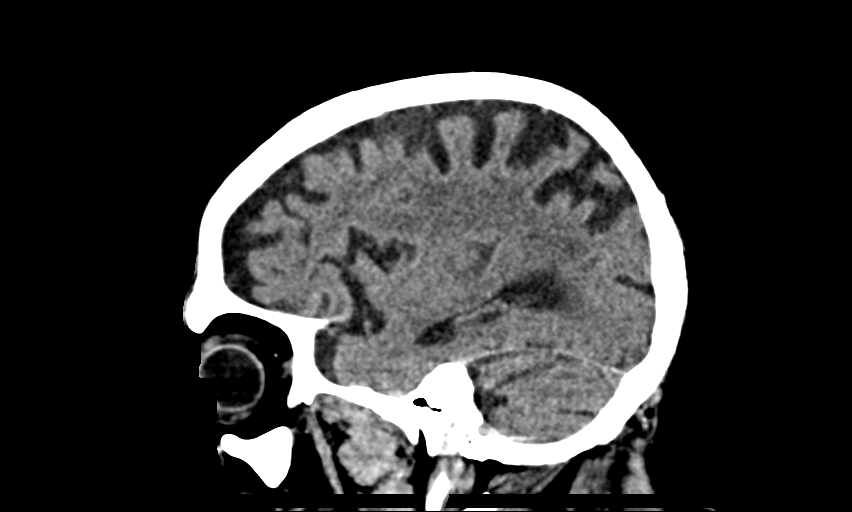

[14 of 47 positions shown; findings below may reference images not displayed]

FINDINGS: Brain: There is mild cerebral atrophy with widening of the
extra-axial spaces and ventricular dilatation.
There are areas of decreased attenuation within the white matter
tracts of the supratentorial brain, consistent with microvascular
disease changes.

Vascular: No hyperdense vessel or unexpected calcification.

Skull: Normal. Negative for fracture or focal lesion.

Sinuses/Orbits: There is mild left maxillary sinus mucosal
thickening. Marked severity bilateral ethmoid sinus mucosal
thickening is also seen.

Other: None.
IMPRESSION: 1. No acute intracranial abnormality.
2. Generalized cerebral atrophy and microvascular disease changes of
the supratentorial brain.
3. Marked severity bilateral ethmoid sinus and mild left maxillary
sinus disease.
